# Patient Record
Sex: Female | Born: 1985 | Race: White | Hispanic: No | Marital: Single | Smoking: Former smoker
Health system: Southern US, Community
[De-identification: ages and names within clinical notes are randomized; demographics above are authoritative.]

## PROBLEM LIST (undated history)

## (undated) ENCOUNTER — Inpatient Hospital Stay: Payer: Self-pay

## (undated) DIAGNOSIS — F909 Attention-deficit hyperactivity disorder, unspecified type: Secondary | ICD-10-CM

## (undated) DIAGNOSIS — Z87442 Personal history of urinary calculi: Secondary | ICD-10-CM

## (undated) DIAGNOSIS — Z8489 Family history of other specified conditions: Secondary | ICD-10-CM

## (undated) DIAGNOSIS — K7689 Other specified diseases of liver: Secondary | ICD-10-CM

## (undated) DIAGNOSIS — R87629 Unspecified abnormal cytological findings in specimens from vagina: Secondary | ICD-10-CM

## (undated) DIAGNOSIS — K219 Gastro-esophageal reflux disease without esophagitis: Secondary | ICD-10-CM

## (undated) DIAGNOSIS — F419 Anxiety disorder, unspecified: Secondary | ICD-10-CM

## (undated) DIAGNOSIS — K760 Fatty (change of) liver, not elsewhere classified: Secondary | ICD-10-CM

## (undated) HISTORY — DX: Unspecified abnormal cytological findings in specimens from vagina: R87.629

## (undated) HISTORY — PX: CHOLECYSTECTOMY: SHX55

---
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
## 2006-12-11 ENCOUNTER — Emergency Department: Payer: Self-pay | Admitting: Emergency Medicine

## 2007-01-26 ENCOUNTER — Ambulatory Visit: Payer: Self-pay | Admitting: Unknown Physician Specialty

## 2007-02-05 ENCOUNTER — Emergency Department: Payer: Self-pay | Admitting: Emergency Medicine

## 2007-06-16 ENCOUNTER — Emergency Department: Payer: Self-pay | Admitting: Emergency Medicine

## 2007-09-23 ENCOUNTER — Emergency Department: Payer: Self-pay

## 2007-12-07 ENCOUNTER — Observation Stay: Payer: Self-pay | Admitting: Unknown Physician Specialty

## 2007-12-18 ENCOUNTER — Emergency Department: Payer: Self-pay | Admitting: Unknown Physician Specialty

## 2008-01-04 ENCOUNTER — Observation Stay: Payer: Self-pay | Admitting: Obstetrics & Gynecology

## 2008-01-05 ENCOUNTER — Observation Stay: Payer: Self-pay

## 2008-01-20 ENCOUNTER — Observation Stay: Payer: Self-pay

## 2008-01-24 ENCOUNTER — Observation Stay: Payer: Self-pay

## 2008-01-27 ENCOUNTER — Observation Stay: Payer: Self-pay | Admitting: Unknown Physician Specialty

## 2008-02-01 ENCOUNTER — Observation Stay: Payer: Self-pay

## 2008-02-07 ENCOUNTER — Inpatient Hospital Stay: Payer: Self-pay

## 2008-02-07 DIAGNOSIS — O4702 False labor before 37 completed weeks of gestation, second trimester: Secondary | ICD-10-CM

## 2008-08-13 ENCOUNTER — Encounter: Payer: Self-pay | Admitting: Family Medicine

## 2008-08-18 ENCOUNTER — Encounter: Payer: Self-pay | Admitting: Family Medicine

## 2008-09-08 ENCOUNTER — Emergency Department: Payer: Self-pay | Admitting: Emergency Medicine

## 2008-09-18 ENCOUNTER — Encounter: Payer: Self-pay | Admitting: Family Medicine

## 2008-10-18 ENCOUNTER — Encounter: Payer: Self-pay | Admitting: Family Medicine

## 2009-09-03 ENCOUNTER — Emergency Department: Payer: Self-pay | Admitting: Emergency Medicine

## 2009-11-09 ENCOUNTER — Emergency Department: Payer: Self-pay | Admitting: Emergency Medicine

## 2009-11-29 ENCOUNTER — Emergency Department: Payer: Self-pay | Admitting: Emergency Medicine

## 2010-01-15 ENCOUNTER — Emergency Department: Payer: Self-pay | Admitting: Emergency Medicine

## 2010-01-18 DIAGNOSIS — R87629 Unspecified abnormal cytological findings in specimens from vagina: Secondary | ICD-10-CM

## 2010-01-18 HISTORY — DX: Unspecified abnormal cytological findings in specimens from vagina: R87.629

## 2010-05-25 ENCOUNTER — Emergency Department: Payer: Self-pay | Admitting: Emergency Medicine

## 2010-07-27 ENCOUNTER — Emergency Department: Payer: Self-pay | Admitting: Emergency Medicine

## 2010-10-13 ENCOUNTER — Emergency Department: Payer: Self-pay | Admitting: Emergency Medicine

## 2011-07-09 ENCOUNTER — Emergency Department: Payer: Self-pay | Admitting: Emergency Medicine

## 2011-07-09 LAB — URINALYSIS, COMPLETE
Bacteria: NONE SEEN
Bilirubin,UR: NEGATIVE
Glucose,UR: 50 mg/dL (ref 0–75)
Ketone: NEGATIVE
Nitrite: NEGATIVE
RBC,UR: 1 /HPF (ref 0–5)
Specific Gravity: 1.019 (ref 1.003–1.030)

## 2011-07-09 LAB — CBC
HCT: 40.6 % (ref 35.0–47.0)
HGB: 13.8 g/dL (ref 12.0–16.0)
MCH: 30.3 pg (ref 26.0–34.0)
MCHC: 34 g/dL (ref 32.0–36.0)
MCV: 89 fL (ref 80–100)
Platelet: 184 10*3/uL (ref 150–440)
RBC: 4.56 10*6/uL (ref 3.80–5.20)
RDW: 13.2 % (ref 11.5–14.5)
WBC: 10.8 10*3/uL (ref 3.6–11.0)

## 2011-07-09 LAB — HCG, QUANTITATIVE, PREGNANCY: Beta Hcg, Quant.: 2501 m[IU]/mL — ABNORMAL HIGH

## 2011-07-10 ENCOUNTER — Emergency Department: Payer: Self-pay | Admitting: *Deleted

## 2011-10-16 ENCOUNTER — Emergency Department: Payer: Self-pay | Admitting: *Deleted

## 2011-10-16 LAB — URINALYSIS, COMPLETE
Bilirubin,UR: NEGATIVE
Glucose,UR: NEGATIVE mg/dL (ref 0–75)
Ketone: NEGATIVE
Leukocyte Esterase: NEGATIVE
Nitrite: NEGATIVE
Ph: 6 (ref 4.5–8.0)
Protein: 30
RBC,UR: 172 /HPF (ref 0–5)
Specific Gravity: 1.025 (ref 1.003–1.030)
Squamous Epithelial: 17
WBC UR: 9 /HPF (ref 0–5)

## 2011-10-16 LAB — CBC
HCT: 41.4 % (ref 35.0–47.0)
HGB: 14.3 g/dL (ref 12.0–16.0)
MCH: 30.7 pg (ref 26.0–34.0)
MCHC: 34.5 g/dL (ref 32.0–36.0)
MCV: 89 fL (ref 80–100)
Platelet: 193 10*3/uL (ref 150–440)
RBC: 4.65 10*6/uL (ref 3.80–5.20)
RDW: 12.9 % (ref 11.5–14.5)
WBC: 9.3 10*3/uL (ref 3.6–11.0)

## 2011-10-16 LAB — COMPREHENSIVE METABOLIC PANEL
Albumin: 4 g/dL (ref 3.4–5.0)
Alkaline Phosphatase: 67 U/L (ref 50–136)
Anion Gap: 9 (ref 7–16)
BUN: 13 mg/dL (ref 7–18)
Bilirubin,Total: 0.2 mg/dL (ref 0.2–1.0)
Calcium, Total: 8.7 mg/dL (ref 8.5–10.1)
Chloride: 107 mmol/L (ref 98–107)
Co2: 25 mmol/L (ref 21–32)
Creatinine: 0.69 mg/dL (ref 0.60–1.30)
EGFR (African American): >60
EGFR (Non-African Amer.): >60
Glucose: 76 mg/dL (ref 65–99)
Osmolality: 280 (ref 275–301)
Potassium: 3.6 mmol/L (ref 3.5–5.1)
SGOT(AST): 21 U/L (ref 15–37)
SGPT (ALT): 22 U/L (ref 12–78)
Sodium: 141 mmol/L (ref 136–145)
Total Protein: 7.6 g/dL (ref 6.4–8.2)

## 2011-11-15 ENCOUNTER — Emergency Department: Payer: Self-pay | Admitting: Emergency Medicine

## 2011-11-15 LAB — URINALYSIS, COMPLETE
Bacteria: NONE SEEN
Glucose,UR: NEGATIVE mg/dL (ref 0–75)
Ketone: NEGATIVE
Nitrite: NEGATIVE
Protein: NEGATIVE
RBC,UR: 2 /HPF (ref 0–5)
Specific Gravity: 1.021 (ref 1.003–1.030)
Squamous Epithelial: 1
WBC UR: 2 /HPF (ref 0–5)

## 2011-11-15 LAB — CBC
HGB: 14.4 g/dL (ref 12.0–16.0)
MCH: 31.4 pg (ref 26.0–34.0)
MCHC: 35.3 g/dL (ref 32.0–36.0)
MCV: 89 fL (ref 80–100)
Platelet: 201 10*3/uL (ref 150–440)
RBC: 4.57 10*6/uL (ref 3.80–5.20)
RDW: 13 % (ref 11.5–14.5)

## 2011-11-15 LAB — COMPREHENSIVE METABOLIC PANEL
Albumin: 3.9 g/dL (ref 3.4–5.0)
Alkaline Phosphatase: 75 U/L (ref 50–136)
Anion Gap: 8 (ref 7–16)
BUN: 15 mg/dL (ref 7–18)
Bilirubin,Total: 0.2 mg/dL (ref 0.2–1.0)
Calcium, Total: 8.9 mg/dL (ref 8.5–10.1)
Chloride: 105 mmol/L (ref 98–107)
Co2: 27 mmol/L (ref 21–32)
Creatinine: 0.88 mg/dL (ref 0.60–1.30)
EGFR (African American): >60
EGFR (Non-African Amer.): >60
Osmolality: 280 (ref 275–301)
Potassium: 4.2 mmol/L (ref 3.5–5.1)
SGOT(AST): 14 U/L — ABNORMAL LOW (ref 15–37)
SGPT (ALT): 23 U/L (ref 12–78)
Sodium: 140 mmol/L (ref 136–145)
Total Protein: 7.4 g/dL (ref 6.4–8.2)

## 2012-01-19 HISTORY — PX: DILATION AND CURETTAGE OF UTERUS: SHX78

## 2012-02-04 ENCOUNTER — Emergency Department: Payer: Self-pay | Admitting: Emergency Medicine

## 2012-02-04 LAB — WET PREP, GENITAL

## 2012-02-04 LAB — CBC
HCT: 39.8 % (ref 35.0–47.0)
HGB: 13.8 g/dL (ref 12.0–16.0)
MCH: 30.2 pg (ref 26.0–34.0)
Platelet: 180 10*3/uL (ref 150–440)
RBC: 4.56 10*6/uL (ref 3.80–5.20)
RDW: 13 % (ref 11.5–14.5)
WBC: 9 10*3/uL (ref 3.6–11.0)

## 2012-02-04 LAB — BASIC METABOLIC PANEL
Anion Gap: 9 (ref 7–16)
BUN: 15 mg/dL (ref 7–18)
Co2: 24 mmol/L (ref 21–32)
Creatinine: 0.68 mg/dL (ref 0.60–1.30)
EGFR (Non-African Amer.): >60
Osmolality: 277 (ref 275–301)
Potassium: 3.9 mmol/L (ref 3.5–5.1)

## 2012-02-04 LAB — URINALYSIS, COMPLETE
Blood: NEGATIVE
Ketone: NEGATIVE
Ph: 6 (ref 4.5–8.0)
Protein: NEGATIVE
RBC,UR: 1 /HPF (ref 0–5)
Squamous Epithelial: 13
WBC UR: 3 /HPF (ref 0–5)

## 2012-06-05 ENCOUNTER — Emergency Department: Payer: Self-pay | Admitting: Emergency Medicine

## 2012-06-05 LAB — COMPREHENSIVE METABOLIC PANEL
Albumin: 4.1 g/dL (ref 3.4–5.0)
Anion Gap: 8 (ref 7–16)
Bilirubin,Total: 0.3 mg/dL (ref 0.2–1.0)
Creatinine: 0.64 mg/dL (ref 0.60–1.30)
EGFR (African American): >60
Osmolality: 270 (ref 275–301)
SGOT(AST): 24 U/L (ref 15–37)
SGPT (ALT): 23 U/L (ref 12–78)
Sodium: 135 mmol/L — ABNORMAL LOW (ref 136–145)
Total Protein: 7.7 g/dL (ref 6.4–8.2)

## 2012-06-05 LAB — URINALYSIS, COMPLETE
Bilirubin,UR: NEGATIVE
Blood: NEGATIVE
Glucose,UR: NEGATIVE mg/dL (ref 0–75)
Leukocyte Esterase: NEGATIVE
RBC,UR: 1 /HPF (ref 0–5)
Squamous Epithelial: 6
WBC UR: 1 /HPF (ref 0–5)

## 2012-06-05 LAB — CBC
HCT: 40.5 % (ref 35.0–47.0)
MCH: 30.1 pg (ref 26.0–34.0)
MCHC: 34.3 g/dL (ref 32.0–36.0)
RBC: 4.61 10*6/uL (ref 3.80–5.20)
RDW: 12.6 % (ref 11.5–14.5)
WBC: 12.5 10*3/uL — ABNORMAL HIGH (ref 3.6–11.0)

## 2012-06-07 ENCOUNTER — Emergency Department: Payer: Self-pay

## 2012-06-07 LAB — CBC WITH DIFFERENTIAL/PLATELET
Basophil #: 0.1 10*3/uL (ref 0.0–0.1)
Eosinophil #: 0.4 10*3/uL (ref 0.0–0.7)
HCT: 40.8 % (ref 35.0–47.0)
HGB: 14 g/dL (ref 12.0–16.0)
Lymphocyte #: 2.6 10*3/uL (ref 1.0–3.6)
MCH: 30.2 pg (ref 26.0–34.0)
MCHC: 34.4 g/dL (ref 32.0–36.0)
MCV: 88 fL (ref 80–100)
Monocyte %: 5.8 %
Neutrophil #: 8.3 10*3/uL — ABNORMAL HIGH (ref 1.4–6.5)
Platelet: 214 10*3/uL (ref 150–440)
RDW: 13 % (ref 11.5–14.5)
WBC: 12.1 10*3/uL — ABNORMAL HIGH (ref 3.6–11.0)

## 2012-06-07 LAB — HCG, QUANTITATIVE, PREGNANCY: Beta Hcg, Quant.: 138 m[IU]/mL — ABNORMAL HIGH

## 2012-06-21 ENCOUNTER — Emergency Department: Payer: Self-pay | Admitting: Emergency Medicine

## 2012-06-21 LAB — HCG, QUANTITATIVE, PREGNANCY: Beta Hcg, Quant.: 15585 m[IU]/mL — ABNORMAL HIGH

## 2012-06-21 LAB — BASIC METABOLIC PANEL
Anion Gap: 8 (ref 7–16)
Calcium, Total: 9 mg/dL (ref 8.5–10.1)
Chloride: 105 mmol/L (ref 98–107)
Co2: 23 mmol/L (ref 21–32)
EGFR (African American): >60
EGFR (Non-African Amer.): >60
Osmolality: 270 (ref 275–301)
Potassium: 3.9 mmol/L (ref 3.5–5.1)

## 2012-06-21 LAB — URINALYSIS, COMPLETE
Bilirubin,UR: NEGATIVE
Glucose,UR: NEGATIVE mg/dL (ref 0–75)
Nitrite: NEGATIVE
Protein: NEGATIVE
RBC,UR: 1120 /HPF (ref 0–5)
Specific Gravity: 1.023 (ref 1.003–1.030)
Squamous Epithelial: 6

## 2012-06-21 LAB — CBC
HGB: 13.7 g/dL (ref 12.0–16.0)
MCH: 30.2 pg (ref 26.0–34.0)
MCHC: 35 g/dL (ref 32.0–36.0)
MCV: 86 fL (ref 80–100)
Platelet: 220 10*3/uL (ref 150–440)
RDW: 12.6 % (ref 11.5–14.5)
WBC: 10.4 10*3/uL (ref 3.6–11.0)

## 2012-08-18 ENCOUNTER — Emergency Department: Payer: Self-pay | Admitting: Emergency Medicine

## 2012-09-23 ENCOUNTER — Emergency Department: Payer: Self-pay | Admitting: Emergency Medicine

## 2012-11-19 ENCOUNTER — Emergency Department: Payer: Self-pay | Admitting: Emergency Medicine

## 2012-11-19 LAB — COMPREHENSIVE METABOLIC PANEL
Albumin: 3.9 g/dL (ref 3.4–5.0)
Alkaline Phosphatase: 82 U/L (ref 50–136)
Anion Gap: 6 — ABNORMAL LOW (ref 7–16)
Chloride: 106 mmol/L (ref 98–107)
Co2: 26 mmol/L (ref 21–32)
Creatinine: 0.79 mg/dL (ref 0.60–1.30)
EGFR (African American): >60
EGFR (Non-African Amer.): >60
Osmolality: 275 (ref 275–301)
SGOT(AST): 13 U/L — ABNORMAL LOW (ref 15–37)
SGPT (ALT): 18 U/L (ref 12–78)
Sodium: 138 mmol/L (ref 136–145)

## 2012-11-19 LAB — URINALYSIS, COMPLETE
Bacteria: NONE SEEN
Bilirubin,UR: NEGATIVE
Ketone: NEGATIVE
Leukocyte Esterase: NEGATIVE
Nitrite: NEGATIVE
RBC,UR: <1 /HPF (ref 0–5)
Specific Gravity: 1.016 (ref 1.003–1.030)
Squamous Epithelial: 1
WBC UR: 1 /HPF (ref 0–5)

## 2012-11-19 LAB — CBC
HGB: 13.8 g/dL (ref 12.0–16.0)
MCH: 30.7 pg (ref 26.0–34.0)
RBC: 4.48 10*6/uL (ref 3.80–5.20)
WBC: 7.6 10*3/uL (ref 3.6–11.0)

## 2012-12-07 ENCOUNTER — Emergency Department: Payer: Self-pay | Admitting: Emergency Medicine

## 2012-12-07 LAB — URINALYSIS, COMPLETE
Nitrite: NEGATIVE
Protein: 100
RBC,UR: 415 /HPF (ref 0–5)
Specific Gravity: 1.027 (ref 1.003–1.030)
Squamous Epithelial: 76
WBC UR: 220 /HPF (ref 0–5)

## 2012-12-07 LAB — CBC WITH DIFFERENTIAL/PLATELET
Basophil %: 0.6 %
Eosinophil #: 0.4 10*3/uL (ref 0.0–0.7)
Eosinophil %: 3.4 %
HCT: 39.4 % (ref 35.0–47.0)
HGB: 13.6 g/dL (ref 12.0–16.0)
Lymphocyte #: 2.2 10*3/uL (ref 1.0–3.6)
Lymphocyte %: 17.4 %
MCHC: 34.6 g/dL (ref 32.0–36.0)
MCV: 87 fL (ref 80–100)
Monocyte #: 0.8 x10 3/mm (ref 0.2–0.9)
Monocyte %: 6.2 %
Neutrophil #: 9.1 10*3/uL — ABNORMAL HIGH (ref 1.4–6.5)
RBC: 4.53 10*6/uL (ref 3.80–5.20)
RDW: 13.3 % (ref 11.5–14.5)
WBC: 12.6 10*3/uL — ABNORMAL HIGH (ref 3.6–11.0)

## 2012-12-07 LAB — COMPREHENSIVE METABOLIC PANEL
Albumin: 4.1 g/dL (ref 3.4–5.0)
Alkaline Phosphatase: 92 U/L (ref 50–136)
Anion Gap: 7 (ref 7–16)
BUN: 11 mg/dL (ref 7–18)
Bilirubin,Total: 0.3 mg/dL (ref 0.2–1.0)
Calcium, Total: 8.8 mg/dL (ref 8.5–10.1)
Chloride: 105 mmol/L (ref 98–107)
Co2: 27 mmol/L (ref 21–32)
Creatinine: 0.74 mg/dL (ref 0.60–1.30)
EGFR (African American): >60
Glucose: 88 mg/dL (ref 65–99)
Potassium: 3.6 mmol/L (ref 3.5–5.1)
SGOT(AST): 15 U/L (ref 15–37)
SGPT (ALT): 20 U/L (ref 12–78)
Total Protein: 7.3 g/dL (ref 6.4–8.2)

## 2012-12-22 ENCOUNTER — Emergency Department: Payer: Self-pay | Admitting: Emergency Medicine

## 2012-12-22 LAB — COMPREHENSIVE METABOLIC PANEL
Albumin: 3.9 g/dL (ref 3.4–5.0)
Alkaline Phosphatase: 61 U/L
Bilirubin,Total: 0.3 mg/dL (ref 0.2–1.0)
Calcium, Total: 8.9 mg/dL (ref 8.5–10.1)
Chloride: 106 mmol/L (ref 98–107)
Co2: 24 mmol/L (ref 21–32)
Creatinine: 0.57 mg/dL — ABNORMAL LOW (ref 0.60–1.30)
EGFR (Non-African Amer.): >60
Glucose: 86 mg/dL (ref 65–99)
Osmolality: 275 (ref 275–301)
Potassium: 4 mmol/L (ref 3.5–5.1)
SGOT(AST): 12 U/L — ABNORMAL LOW (ref 15–37)
SGPT (ALT): 18 U/L (ref 12–78)
Sodium: 138 mmol/L (ref 136–145)
Total Protein: 7.1 g/dL (ref 6.4–8.2)

## 2012-12-22 LAB — URINALYSIS, COMPLETE
Bacteria: NONE SEEN
Bilirubin,UR: NEGATIVE
Glucose,UR: NEGATIVE mg/dL (ref 0–75)
Ketone: NEGATIVE
Ph: 6 (ref 4.5–8.0)
Protein: NEGATIVE
RBC,UR: 1 /HPF (ref 0–5)
Specific Gravity: 1.025 (ref 1.003–1.030)
Squamous Epithelial: 3
WBC UR: 2 /HPF (ref 0–5)

## 2012-12-22 LAB — CBC WITH DIFFERENTIAL/PLATELET
Basophil #: 0.1 10*3/uL (ref 0.0–0.1)
Eosinophil #: 0.5 10*3/uL (ref 0.0–0.7)
Eosinophil %: 6.5 %
Lymphocyte %: 35.8 %
MCH: 30.1 pg (ref 26.0–34.0)
MCHC: 34.6 g/dL (ref 32.0–36.0)
MCV: 87 fL (ref 80–100)
Monocyte #: 0.5 x10 3/mm (ref 0.2–0.9)
Neutrophil %: 49.5 %
Platelet: 197 10*3/uL (ref 150–440)
RBC: 4.41 10*6/uL (ref 3.80–5.20)
WBC: 7.3 10*3/uL (ref 3.6–11.0)

## 2012-12-22 LAB — LIPASE, BLOOD: Lipase: 165 U/L (ref 73–393)

## 2013-01-23 ENCOUNTER — Emergency Department: Payer: Self-pay | Admitting: Emergency Medicine

## 2013-01-23 LAB — COMPREHENSIVE METABOLIC PANEL
ANION GAP: 5 — AB (ref 7–16)
Albumin: 4 g/dL (ref 3.4–5.0)
Alkaline Phosphatase: 68 U/L
BILIRUBIN TOTAL: 0.8 mg/dL (ref 0.2–1.0)
BUN: 13 mg/dL (ref 7–18)
CALCIUM: 9 mg/dL (ref 8.5–10.1)
CHLORIDE: 106 mmol/L (ref 98–107)
Co2: 28 mmol/L (ref 21–32)
Creatinine: 0.74 mg/dL (ref 0.60–1.30)
Glucose: 91 mg/dL (ref 65–99)
OSMOLALITY: 277 (ref 275–301)
Potassium: 3.7 mmol/L (ref 3.5–5.1)
SGOT(AST): 10 U/L — ABNORMAL LOW (ref 15–37)
SGPT (ALT): 17 U/L (ref 12–78)
SODIUM: 139 mmol/L (ref 136–145)
Total Protein: 7.3 g/dL (ref 6.4–8.2)

## 2013-01-23 LAB — CBC WITH DIFFERENTIAL/PLATELET
BASOS ABS: 0 10*3/uL (ref 0.0–0.1)
Basophil %: 0.4 %
Eosinophil #: 0.2 10*3/uL (ref 0.0–0.7)
Eosinophil %: 2.6 %
HCT: 40 % (ref 35.0–47.0)
HGB: 14.1 g/dL (ref 12.0–16.0)
Lymphocyte #: 1.7 10*3/uL (ref 1.0–3.6)
Lymphocyte %: 20.3 %
MCH: 30.6 pg (ref 26.0–34.0)
MCHC: 35.4 g/dL (ref 32.0–36.0)
MCV: 86 fL (ref 80–100)
Monocyte #: 0.5 x10 3/mm (ref 0.2–0.9)
Monocyte %: 5.8 %
NEUTROS ABS: 6 10*3/uL (ref 1.4–6.5)
NEUTROS PCT: 70.9 %
Platelet: 199 10*3/uL (ref 150–440)
RBC: 4.63 10*6/uL (ref 3.80–5.20)
RDW: 12.9 % (ref 11.5–14.5)
WBC: 8.5 10*3/uL (ref 3.6–11.0)

## 2013-01-23 LAB — URINALYSIS, COMPLETE
Bacteria: NONE SEEN
Bilirubin,UR: NEGATIVE
Blood: NEGATIVE
GLUCOSE, UR: NEGATIVE mg/dL (ref 0–75)
Ketone: NEGATIVE
Leukocyte Esterase: NEGATIVE
Nitrite: NEGATIVE
PH: 5 (ref 4.5–8.0)
SPECIFIC GRAVITY: 1.029 (ref 1.003–1.030)
Squamous Epithelial: 19
WBC UR: 5 /HPF (ref 0–5)

## 2013-01-23 LAB — LIPASE, BLOOD: LIPASE: 124 U/L (ref 73–393)

## 2013-03-12 ENCOUNTER — Emergency Department: Payer: Self-pay | Admitting: Emergency Medicine

## 2013-03-12 LAB — URINALYSIS, COMPLETE
BACTERIA: NONE SEEN
BILIRUBIN, UR: NEGATIVE
Blood: NEGATIVE
Glucose,UR: NEGATIVE mg/dL (ref 0–75)
KETONE: NEGATIVE
Nitrite: NEGATIVE
PH: 6 (ref 4.5–8.0)
Protein: NEGATIVE
RBC,UR: 6 /HPF (ref 0–5)
Specific Gravity: 1.028 (ref 1.003–1.030)
Squamous Epithelial: 11

## 2013-04-10 ENCOUNTER — Emergency Department: Payer: Self-pay | Admitting: Emergency Medicine

## 2013-04-14 ENCOUNTER — Emergency Department: Payer: Self-pay | Admitting: Emergency Medicine

## 2013-05-12 ENCOUNTER — Emergency Department: Payer: Self-pay | Admitting: Emergency Medicine

## 2013-05-12 LAB — CBC
HCT: 37.8 % (ref 35.0–47.0)
HGB: 12.8 g/dL (ref 12.0–16.0)
MCH: 30.7 pg (ref 26.0–34.0)
MCHC: 33.9 g/dL (ref 32.0–36.0)
MCV: 91 fL (ref 80–100)
Platelet: 165 10*3/uL (ref 150–440)
RBC: 4.16 10*6/uL (ref 3.80–5.20)
RDW: 13.1 % (ref 11.5–14.5)
WBC: 11.5 10*3/uL — ABNORMAL HIGH (ref 3.6–11.0)

## 2013-05-12 LAB — URINALYSIS, COMPLETE
BACTERIA: NONE SEEN
BILIRUBIN, UR: NEGATIVE
GLUCOSE, UR: NEGATIVE mg/dL (ref 0–75)
Ketone: NEGATIVE
Leukocyte Esterase: NEGATIVE
NITRITE: NEGATIVE
PROTEIN: NEGATIVE
Ph: 5 (ref 4.5–8.0)
SPECIFIC GRAVITY: 1.026 (ref 1.003–1.030)
WBC UR: 3 /HPF (ref 0–5)

## 2013-05-12 LAB — GC/CHLAMYDIA PROBE AMP

## 2013-05-12 LAB — HCG, QUANTITATIVE, PREGNANCY: BETA HCG, QUANT.: 20378 m[IU]/mL — AB

## 2013-05-12 LAB — WET PREP, GENITAL

## 2013-06-22 ENCOUNTER — Emergency Department: Payer: Self-pay | Admitting: Emergency Medicine

## 2013-06-22 LAB — URINALYSIS, COMPLETE
Bilirubin,UR: NEGATIVE
Glucose,UR: NEGATIVE mg/dL (ref 0–75)
KETONE: NEGATIVE
Leukocyte Esterase: NEGATIVE
NITRITE: NEGATIVE
PH: 5 (ref 4.5–8.0)
Protein: NEGATIVE
RBC,UR: 1 /HPF (ref 0–5)
Specific Gravity: 1.029 (ref 1.003–1.030)
WBC UR: 1 /HPF (ref 0–5)

## 2013-06-22 LAB — COMPREHENSIVE METABOLIC PANEL
ALBUMIN: 4.2 g/dL (ref 3.4–5.0)
Alkaline Phosphatase: 69 U/L
Anion Gap: 5 — ABNORMAL LOW (ref 7–16)
BILIRUBIN TOTAL: 0.3 mg/dL (ref 0.2–1.0)
BUN: 13 mg/dL (ref 7–18)
CREATININE: 0.71 mg/dL (ref 0.60–1.30)
Calcium, Total: 9 mg/dL (ref 8.5–10.1)
Chloride: 109 mmol/L — ABNORMAL HIGH (ref 98–107)
Co2: 26 mmol/L (ref 21–32)
EGFR (Non-African Amer.): >60
GLUCOSE: 79 mg/dL (ref 65–99)
OSMOLALITY: 278 (ref 275–301)
POTASSIUM: 3.6 mmol/L (ref 3.5–5.1)
SGOT(AST): 29 U/L (ref 15–37)
SGPT (ALT): 22 U/L (ref 12–78)
Sodium: 140 mmol/L (ref 136–145)
Total Protein: 7.7 g/dL (ref 6.4–8.2)

## 2013-06-22 LAB — CBC
HCT: 39.8 % (ref 35.0–47.0)
HGB: 13.5 g/dL (ref 12.0–16.0)
MCH: 30.3 pg (ref 26.0–34.0)
MCHC: 33.9 g/dL (ref 32.0–36.0)
MCV: 90 fL (ref 80–100)
Platelet: 189 10*3/uL (ref 150–440)
RBC: 4.44 10*6/uL (ref 3.80–5.20)
RDW: 12.9 % (ref 11.5–14.5)
WBC: 7.8 10*3/uL (ref 3.6–11.0)

## 2013-06-24 LAB — URINE CULTURE

## 2013-08-20 ENCOUNTER — Emergency Department: Payer: Self-pay | Admitting: Emergency Medicine

## 2013-08-20 LAB — URINALYSIS, COMPLETE
BILIRUBIN, UR: NEGATIVE
Bacteria: NONE SEEN
Glucose,UR: NEGATIVE mg/dL (ref 0–75)
KETONE: NEGATIVE
Nitrite: NEGATIVE
PH: 5 (ref 4.5–8.0)
Protein: 30
RBC,UR: 1112 /HPF (ref 0–5)
SPECIFIC GRAVITY: 1.028 (ref 1.003–1.030)

## 2013-08-20 LAB — CBC
HCT: 37.7 % (ref 35.0–47.0)
HGB: 12.6 g/dL (ref 12.0–16.0)
MCH: 30.6 pg (ref 26.0–34.0)
MCHC: 33.5 g/dL (ref 32.0–36.0)
MCV: 91 fL (ref 80–100)
Platelet: 174 10*3/uL (ref 150–440)
RBC: 4.13 10*6/uL (ref 3.80–5.20)
RDW: 12.7 % (ref 11.5–14.5)
WBC: 10.3 10*3/uL (ref 3.6–11.0)

## 2013-08-20 LAB — HCG, QUANTITATIVE, PREGNANCY: BETA HCG, QUANT.: 285 m[IU]/mL — AB

## 2013-09-13 ENCOUNTER — Emergency Department: Payer: Self-pay | Admitting: Emergency Medicine

## 2013-09-13 LAB — CBC WITH DIFFERENTIAL/PLATELET
BASOS ABS: 0.1 10*3/uL (ref 0.0–0.1)
Basophil %: 0.9 %
Eosinophil #: 0.5 10*3/uL (ref 0.0–0.7)
Eosinophil %: 4.6 %
HCT: 40 % (ref 35.0–47.0)
HGB: 13.4 g/dL (ref 12.0–16.0)
LYMPHS PCT: 20.8 %
Lymphocyte #: 2.1 10*3/uL (ref 1.0–3.6)
MCH: 30.2 pg (ref 26.0–34.0)
MCHC: 33.5 g/dL (ref 32.0–36.0)
MCV: 90 fL (ref 80–100)
Monocyte #: 0.7 x10 3/mm (ref 0.2–0.9)
Monocyte %: 6.9 %
Neutrophil #: 6.7 10*3/uL — ABNORMAL HIGH (ref 1.4–6.5)
Neutrophil %: 66.8 %
Platelet: 207 10*3/uL (ref 150–440)
RBC: 4.44 10*6/uL (ref 3.80–5.20)
RDW: 12.5 % (ref 11.5–14.5)
WBC: 10 10*3/uL (ref 3.6–11.0)

## 2013-09-13 LAB — COMPREHENSIVE METABOLIC PANEL
ALK PHOS: 67 U/L
Albumin: 3.6 g/dL (ref 3.4–5.0)
Anion Gap: 9 (ref 7–16)
BUN: 9 mg/dL (ref 7–18)
Bilirubin,Total: 0.3 mg/dL (ref 0.2–1.0)
Calcium, Total: 8.7 mg/dL (ref 8.5–10.1)
Chloride: 107 mmol/L (ref 98–107)
Co2: 22 mmol/L (ref 21–32)
Creatinine: 0.7 mg/dL (ref 0.60–1.30)
EGFR (African American): >60
EGFR (Non-African Amer.): >60
Glucose: 88 mg/dL (ref 65–99)
Osmolality: 274 (ref 275–301)
POTASSIUM: 4.1 mmol/L (ref 3.5–5.1)
SGOT(AST): 13 U/L — ABNORMAL LOW (ref 15–37)
SGPT (ALT): 16 U/L
Sodium: 138 mmol/L (ref 136–145)
TOTAL PROTEIN: 7.2 g/dL (ref 6.4–8.2)

## 2013-09-13 LAB — URINALYSIS, COMPLETE
BACTERIA: NONE SEEN
BLOOD: NEGATIVE
Bilirubin,UR: NEGATIVE
Glucose,UR: NEGATIVE mg/dL (ref 0–75)
Ketone: NEGATIVE
LEUKOCYTE ESTERASE: NEGATIVE
Nitrite: NEGATIVE
PROTEIN: NEGATIVE
Ph: 7 (ref 4.5–8.0)
Specific Gravity: 1.02 (ref 1.003–1.030)
Squamous Epithelial: 2
WBC UR: 3 /HPF (ref 0–5)

## 2013-09-13 LAB — HCG, QUANTITATIVE, PREGNANCY: BETA HCG, QUANT.: 12271 m[IU]/mL — AB

## 2013-09-17 ENCOUNTER — Emergency Department: Payer: Self-pay | Admitting: Emergency Medicine

## 2013-09-17 LAB — CBC WITH DIFFERENTIAL/PLATELET
BASOS ABS: 0.1 10*3/uL (ref 0.0–0.1)
Basophil %: 0.5 %
Eosinophil #: 0.5 10*3/uL (ref 0.0–0.7)
Eosinophil %: 3.7 %
HCT: 38 % (ref 35.0–47.0)
HGB: 12.9 g/dL (ref 12.0–16.0)
LYMPHS PCT: 16.6 %
Lymphocyte #: 2 10*3/uL (ref 1.0–3.6)
MCH: 30.6 pg (ref 26.0–34.0)
MCHC: 34 g/dL (ref 32.0–36.0)
MCV: 90 fL (ref 80–100)
MONOS PCT: 5.7 %
Monocyte #: 0.7 x10 3/mm (ref 0.2–0.9)
NEUTROS ABS: 9 10*3/uL — AB (ref 1.4–6.5)
Neutrophil %: 73.5 %
Platelet: 196 10*3/uL (ref 150–440)
RBC: 4.23 10*6/uL (ref 3.80–5.20)
RDW: 12.7 % (ref 11.5–14.5)
WBC: 12.3 10*3/uL — ABNORMAL HIGH (ref 3.6–11.0)

## 2013-09-17 LAB — COMPREHENSIVE METABOLIC PANEL
ANION GAP: 8 (ref 7–16)
Albumin: 3.5 g/dL (ref 3.4–5.0)
Alkaline Phosphatase: 61 U/L
BUN: 8 mg/dL (ref 7–18)
Bilirubin,Total: 0.3 mg/dL (ref 0.2–1.0)
CALCIUM: 8.7 mg/dL (ref 8.5–10.1)
Chloride: 106 mmol/L (ref 98–107)
Co2: 24 mmol/L (ref 21–32)
Creatinine: 0.76 mg/dL (ref 0.60–1.30)
EGFR (African American): >60
Glucose: 83 mg/dL (ref 65–99)
OSMOLALITY: 273 (ref 275–301)
POTASSIUM: 3.8 mmol/L (ref 3.5–5.1)
SGOT(AST): 12 U/L — ABNORMAL LOW (ref 15–37)
SGPT (ALT): 16 U/L
Sodium: 138 mmol/L (ref 136–145)
TOTAL PROTEIN: 6.9 g/dL (ref 6.4–8.2)

## 2013-09-17 LAB — HCG, QUANTITATIVE, PREGNANCY: Beta Hcg, Quant.: 12252 m[IU]/mL — ABNORMAL HIGH

## 2013-09-17 LAB — URINALYSIS, COMPLETE
Bacteria: NONE SEEN
Bilirubin,UR: NEGATIVE
Blood: NEGATIVE
Glucose,UR: NEGATIVE mg/dL (ref 0–75)
KETONE: NEGATIVE
Nitrite: NEGATIVE
PH: 5 (ref 4.5–8.0)
PROTEIN: NEGATIVE
Specific Gravity: 1.026 (ref 1.003–1.030)
Squamous Epithelial: 6

## 2013-09-17 LAB — WET PREP, GENITAL

## 2013-09-24 ENCOUNTER — Emergency Department: Payer: Self-pay | Admitting: Emergency Medicine

## 2013-10-29 ENCOUNTER — Emergency Department: Payer: Self-pay | Admitting: Emergency Medicine

## 2013-10-29 LAB — URINALYSIS, COMPLETE
BACTERIA: NONE SEEN
BLOOD: NEGATIVE
Bilirubin,UR: NEGATIVE
Glucose,UR: NEGATIVE mg/dL (ref 0–75)
Ketone: NEGATIVE
Nitrite: NEGATIVE
PH: 5 (ref 4.5–8.0)
PROTEIN: NEGATIVE
RBC,UR: 10 /HPF (ref 0–5)
SPECIFIC GRAVITY: 1.02 (ref 1.003–1.030)
Squamous Epithelial: 30
Transitional Epi: 2
WBC UR: 33 /HPF (ref 0–5)

## 2013-10-29 LAB — WET PREP, GENITAL

## 2013-10-29 LAB — GC/CHLAMYDIA PROBE AMP

## 2013-10-31 LAB — URINE CULTURE

## 2013-12-18 ENCOUNTER — Emergency Department: Payer: Self-pay | Admitting: Emergency Medicine

## 2013-12-18 LAB — CBC WITH DIFFERENTIAL/PLATELET
Basophil #: 0.1 10*3/uL (ref 0.0–0.1)
Basophil %: 0.6 %
EOS PCT: 4.9 %
Eosinophil #: 0.6 10*3/uL (ref 0.0–0.7)
HCT: 38.9 % (ref 35.0–47.0)
HGB: 12.9 g/dL (ref 12.0–16.0)
Lymphocyte #: 2.5 10*3/uL (ref 1.0–3.6)
Lymphocyte %: 19.5 %
MCH: 30.3 pg (ref 26.0–34.0)
MCHC: 33.2 g/dL (ref 32.0–36.0)
MCV: 91 fL (ref 80–100)
MONO ABS: 0.8 x10 3/mm (ref 0.2–0.9)
Monocyte %: 6.4 %
NEUTROS PCT: 68.6 %
Neutrophil #: 8.7 10*3/uL — ABNORMAL HIGH (ref 1.4–6.5)
PLATELETS: 200 10*3/uL (ref 150–440)
RBC: 4.27 10*6/uL (ref 3.80–5.20)
RDW: 13.3 % (ref 11.5–14.5)
WBC: 12.6 10*3/uL — ABNORMAL HIGH (ref 3.6–11.0)

## 2013-12-18 LAB — COMPREHENSIVE METABOLIC PANEL
ALK PHOS: 60 U/L
Albumin: 3.4 g/dL (ref 3.4–5.0)
Anion Gap: 7 (ref 7–16)
BUN: 12 mg/dL (ref 7–18)
Bilirubin,Total: 0.2 mg/dL (ref 0.2–1.0)
CALCIUM: 8.3 mg/dL — AB (ref 8.5–10.1)
CHLORIDE: 107 mmol/L (ref 98–107)
CO2: 25 mmol/L (ref 21–32)
Creatinine: 0.68 mg/dL (ref 0.60–1.30)
EGFR (African American): >60
EGFR (Non-African Amer.): >60
Glucose: 117 mg/dL — ABNORMAL HIGH (ref 65–99)
OSMOLALITY: 278 (ref 275–301)
POTASSIUM: 3.8 mmol/L (ref 3.5–5.1)
SGOT(AST): 14 U/L — ABNORMAL LOW (ref 15–37)
SGPT (ALT): 20 U/L
Sodium: 139 mmol/L (ref 136–145)
TOTAL PROTEIN: 6.6 g/dL (ref 6.4–8.2)

## 2013-12-18 LAB — URINALYSIS, COMPLETE
BACTERIA: NONE SEEN
Bilirubin,UR: NEGATIVE
Glucose,UR: NEGATIVE mg/dL (ref 0–75)
Ketone: NEGATIVE
NITRITE: NEGATIVE
Ph: 5 (ref 4.5–8.0)
Protein: 30
SPECIFIC GRAVITY: 1.027 (ref 1.003–1.030)

## 2013-12-18 LAB — HCG, QUANTITATIVE, PREGNANCY: Beta Hcg, Quant.: 946 m[IU]/mL — ABNORMAL HIGH

## 2014-01-14 ENCOUNTER — Emergency Department: Payer: Self-pay | Admitting: Emergency Medicine

## 2014-01-19 ENCOUNTER — Emergency Department: Payer: Self-pay | Admitting: Emergency Medicine

## 2014-03-01 ENCOUNTER — Emergency Department: Payer: Self-pay | Admitting: Emergency Medicine

## 2014-03-22 ENCOUNTER — Emergency Department: Payer: Self-pay | Admitting: Student

## 2014-04-12 ENCOUNTER — Emergency Department: Payer: Self-pay | Admitting: Student

## 2014-04-12 LAB — URINALYSIS, COMPLETE
BILIRUBIN, UR: NEGATIVE
BLOOD: NEGATIVE
Bacteria: NONE SEEN
Glucose,UR: NEGATIVE mg/dL (ref 0–75)
Ketone: NEGATIVE
LEUKOCYTE ESTERASE: NEGATIVE
NITRITE: NEGATIVE
PROTEIN: NEGATIVE
Ph: 5 (ref 4.5–8.0)
Specific Gravity: 1.028 (ref 1.003–1.030)
WBC UR: 2 /HPF (ref 0–5)

## 2014-04-12 LAB — COMPREHENSIVE METABOLIC PANEL
ALBUMIN: 4.2 g/dL
ALK PHOS: 61 U/L
Anion Gap: 7 (ref 7–16)
BUN: 15 mg/dL
Bilirubin,Total: 0.4 mg/dL
CO2: 26 mmol/L
Calcium, Total: 9.3 mg/dL
Chloride: 109 mmol/L
Creatinine: 0.68 mg/dL
EGFR (African American): >60
EGFR (Non-African Amer.): >60
Glucose: 111 mg/dL — ABNORMAL HIGH
POTASSIUM: 3.6 mmol/L
SGOT(AST): 23 U/L
SGPT (ALT): 21 U/L
Sodium: 142 mmol/L
Total Protein: 6.8 g/dL

## 2014-04-12 LAB — CBC
HCT: 38.7 % (ref 35.0–47.0)
HGB: 13.2 g/dL (ref 12.0–16.0)
MCH: 30.2 pg (ref 26.0–34.0)
MCHC: 34.1 g/dL (ref 32.0–36.0)
MCV: 89 fL (ref 80–100)
Platelet: 183 10*3/uL (ref 150–440)
RBC: 4.37 10*6/uL (ref 3.80–5.20)
RDW: 12.8 % (ref 11.5–14.5)
WBC: 10.5 10*3/uL (ref 3.6–11.0)

## 2014-04-13 LAB — URINE CULTURE

## 2014-05-24 ENCOUNTER — Encounter: Payer: Self-pay | Admitting: *Deleted

## 2014-05-24 ENCOUNTER — Emergency Department
Admission: EM | Admit: 2014-05-24 | Discharge: 2014-05-24 | Disposition: A | Discharge status: Home / Self Care | Payer: Medicaid Other | Attending: Emergency Medicine | Admitting: Emergency Medicine

## 2014-05-24 ENCOUNTER — Emergency Department: Payer: Medicaid Other

## 2014-05-24 DIAGNOSIS — R102 Pelvic and perineal pain: Secondary | ICD-10-CM | POA: Diagnosis not present

## 2014-05-24 DIAGNOSIS — R1031 Right lower quadrant pain: Secondary | ICD-10-CM | POA: Diagnosis not present

## 2014-05-24 DIAGNOSIS — Z3202 Encounter for pregnancy test, result negative: Secondary | ICD-10-CM | POA: Diagnosis not present

## 2014-05-24 LAB — CBC WITH DIFFERENTIAL/PLATELET
Basophils Absolute: 0.1 10*3/uL (ref 0–0.1)
Basophils Relative: 1 %
EOS ABS: 0.5 10*3/uL (ref 0–0.7)
Eosinophils Relative: 5 %
HEMATOCRIT: 39.8 % (ref 35.0–47.0)
Hemoglobin: 13.5 g/dL (ref 12.0–16.0)
Lymphocytes Relative: 25 %
Lymphs Abs: 2.5 10*3/uL (ref 1.0–3.6)
MCH: 30.3 pg (ref 26.0–34.0)
MCHC: 33.9 g/dL (ref 32.0–36.0)
MCV: 89.5 fL (ref 80.0–100.0)
Monocytes Absolute: 0.5 10*3/uL (ref 0.2–0.9)
Monocytes Relative: 6 %
Neutro Abs: 6.2 10*3/uL (ref 1.4–6.5)
Neutrophils Relative %: 63 %
PLATELETS: 189 10*3/uL (ref 150–440)
RBC: 4.45 MIL/uL (ref 3.80–5.20)
RDW: 12.8 % (ref 11.5–14.5)
WBC: 9.8 10*3/uL (ref 3.6–11.0)

## 2014-05-24 LAB — POCT PREGNANCY, URINE: Preg Test, Ur: NEGATIVE

## 2014-05-24 LAB — URINALYSIS COMPLETE WITH MICROSCOPIC (ARMC ONLY)
BACTERIA UA: NONE SEEN
BILIRUBIN URINE: NEGATIVE
Glucose, UA: NEGATIVE mg/dL
Hgb urine dipstick: NEGATIVE
Ketones, ur: NEGATIVE mg/dL
LEUKOCYTES UA: NEGATIVE
Nitrite: NEGATIVE
Protein, ur: NEGATIVE mg/dL
Specific Gravity, Urine: 1.021 (ref 1.005–1.030)
pH: 5 (ref 5.0–8.0)

## 2014-05-24 LAB — POC URINE PREG, ED: PREG TEST UR: NEGATIVE

## 2014-05-24 NOTE — ED Notes (Signed)
Pt informed to return if any life threatening symptoms occur.  

## 2014-05-24 NOTE — ED Notes (Signed)
Patient transported to Ultrasound 

## 2014-05-24 NOTE — ED Notes (Signed)
MD at bedside. 

## 2014-05-24 NOTE — Discharge Instructions (Signed)
Your ultrasound looks normal except for a small cyst in your cervix. There are no cysts on the ovaries. Your urine looks normal with no sign of infection. Your urine pregnancy test was negative. We discussed other possibilities including the unlikely chance of appendicitis. We agreed to not get a CT scan for that today. If you her pain gets worse or your other urgent concerns please return to the emergency department. Follow-up with your doctor at [facility] family practice. He may need to see a gynecologist as well given the delay in your menstrual cycle.  Pelvic Pain Female pelvic pain can be caused by many different things and start from a variety of places. Pelvic pain refers to pain that is located in the lower half of the abdomen and between your hips. The pain may occur over a short period of time (acute) or may be reoccurring (chronic). The cause of pelvic pain may be related to disorders affecting the female reproductive organs (gynecologic), but it may also be related to the bladder, kidney stones, an intestinal complication, or muscle or skeletal problems. Getting help right away for pelvic pain is important, especially if there has been severe, sharp, or a sudden onset of unusual pain. It is also important to get help right away because some types of pelvic pain can be life threatening.  CAUSES  Below are only some of the causes of pelvic pain. The causes of pelvic pain can be in one of several categories.   Gynecologic.  Pelvic inflammatory disease.  Sexually transmitted infection.  Ovarian cyst or a twisted ovarian ligament (ovarian torsion).  Uterine lining that grows outside the uterus (endometriosis).  Fibroids, cysts, or tumors.  Ovulation.  Pregnancy.  Pregnancy that occurs outside the uterus (ectopic pregnancy).  Miscarriage.  Labor.  Abruption of the placenta or ruptured uterus.  Infection.  Uterine infection (endometritis).  Bladder  infection.  Diverticulitis.  Miscarriage related to a uterine infection (septic abortion).  Bladder.  Inflammation of the bladder (cystitis).  Kidney stone(s).  Gastrointestinal.  Constipation.  Diverticulitis.  Neurologic.  Trauma.  Feeling pelvic pain because of mental or emotional causes (psychosomatic).  Cancers of the bowel or pelvis. EVALUATION  Your caregiver will want to take a careful history of your concerns. This includes recent changes in your health, a careful gynecologic history of your periods (menses), and a sexual history. Obtaining your family history and medical history is also important. Your caregiver may suggest a pelvic exam. A pelvic exam will help identify the location and severity of the pain. It also helps in the evaluation of which organ system may be involved. In order to identify the cause of the pelvic pain and be properly treated, your caregiver may order tests. These tests may include:   A pregnancy test.  Pelvic ultrasonography.  An X-ray exam of the abdomen.  A urinalysis or evaluation of vaginal discharge.  Blood tests. HOME CARE INSTRUCTIONS   Only take over-the-counter or prescription medicines for pain, discomfort, or fever as directed by your caregiver.   Rest as directed by your caregiver.   Eat a balanced diet.   Drink enough fluids to make your urine clear or pale yellow, or as directed.   Avoid sexual intercourse if it causes pain.   Apply warm or cold compresses to the lower abdomen depending on which one helps the pain.   Avoid stressful situations.   Keep a journal of your pelvic pain. Write down when it started, where the pain is located,  and if there are things that seem to be associated with the pain, such as food or your menstrual cycle.  Follow up with your caregiver as directed.  SEEK MEDICAL CARE IF:  Your medicine does not help your pain.  You have abnormal vaginal discharge. SEEK IMMEDIATE  MEDICAL CARE IF:   You have heavy bleeding from the vagina.   Your pelvic pain increases.   You feel light-headed or faint.   You have chills.   You have pain with urination or blood in your urine.   You have uncontrolled diarrhea or vomiting.   You have a fever or persistent symptoms for more than 3 days.  You have a fever and your symptoms suddenly get worse.   You are being physically or sexually abused.  MAKE SURE YOU:  Understand these instructions.  Will watch your condition.  Will get help if you are not doing well or get worse. Document Released: 12/02/2003 Document Revised: 05/21/2013 Document Reviewed: 04/26/2011 [facility] Patient Information 2015 [place], [place]. This information is not intended to replace advice given to you by your health care provider. Make sure you discuss any questions you have with your health care provider.

## 2014-05-24 NOTE — ED Notes (Signed)
POC urine pregnancy was done at 10:37a the result was negative.

## 2014-05-24 NOTE — ED Notes (Signed)
POCT Pregnany Urine Test reported negative by phone to RN by Sharee Pimple, RN. Test performed in triage.

## 2014-05-24 NOTE — ED Provider Notes (Signed)
[facility] Emergency Department Provider Note ____________________________________________  Time seen: 1210  I have reviewed the triage vital signs and the nursing notes.   HISTORY  Chief Complaint Pelvic Pain     HPI Cassandra Duarte is a 29 y.o. female who has been having discomfort in her right pelvic area for the past week. She had is 13 days late for her menstrual cycle. She has been seen by her primary doctor at [facility] family practice. They performed both a urine and a blood pregnancy test. Both were negative for pregnancy. The patient has had additional cramping and discomfort. The patient tells me that her doctor was concerned that she still might be pregnant and she needed an ultrasound and sent her to the emergency department for this.The cramping has been moderate.  No past medical history on file.  There are no active problems to display for this patient.   No past surgical history on file.  No current outpatient prescriptions on file.  Allergies Review of patient's allergies indicates no known allergies.  No family history on file.  Social History History  Substance Use Topics  . Smoking status: Never Smoker   . Smokeless tobacco: Not on file  . Alcohol Use: No    Review of Systems  Constitutional: Negative for fever. Eyes: Negative for visual changes. ENT: Negative for sore throat. Cardiovascular: Negative for chest pain. Respiratory: Negative for shortness of breath. Gastrointestinal: Positive for discomfort in the right pelvic area.  Genitourinary: Negative for dysuria. Musculoskeletal: Negative for back pain. Skin: Negative for rash. Neurological: Negative for headaches, focal weakness or numbness.  10-point ROS otherwise negative.  ____________________________________________   PHYSICAL EXAM:  VITAL SIGNS: ED Triage Vitals  Enc Vitals Group     BP 05/24/14 1023 141/75 mmHg     Pulse Rate 05/24/14 1023 63      Resp --      Temp 05/24/14 1023 98 F (36.7 C)     Temp Source 05/24/14 1023 Oral     SpO2 05/24/14 1023 100 %     Weight 05/24/14 1023 132 lb (59.875 kg)     Height 05/24/14 1023 5\' 3"  (1.6 m)     Head Cir --      Peak Flow --      Pain Score 05/24/14 1024 8     Pain Loc --      Pain Edu? --      Excl. in [place]? --      Constitutional: Alert and oriented. Well appearing and in no distress.Marland Kitchen E[facility]   Head: Normocephalic and atraumatic.   Nose: No congestion/rhinnorhea.   Mouth/Throat: Mucous membranes are moist. Cardiovascular: Normal rate, regular rhythm. Respiratory: Normal respiratory effort.  Breath sounds are clear and equal bilaterally. No wheezes/rales/rhonchi. Gastrointestinal: Soft, mild tenderness in the right suprapubic area.  Genitourinary: Patient recently had a pelvic exam at [facility] family practice. Pelvic deferred Musculoskeletal: Nontender with normal range of motion in all extremities.  Neurologic:  Normal speech and language. No gross focal neurologic deficits are appreciated. Speech is normal. No gait instability. Skin:  Skin is warm, dry and intact.  Psychiatric: Mood and affect are normal. Speech and behavior are normal. Patient exhibits appropriate insight and judgment.  ____________________________________________    LABS (pertinent positives/negatives)  Blood counts are normal with a white blood cell count 9.8, hemoglobin of 13.5. Urine pregnancy test is negative.  _______________________________________________    RADIOLOGY  IMPRESSION: 7 mm nabothian cyst arising from cervix. Study  otherwise unremarkable.  ____________________________________________   PROCEDURES  Procedure(s) performed: None  Critical Care performed: No  ____________________________________________   INITIAL IMPRESSION / ASSESSMENT AND PLAN / ED COURSE  Pertinent labs & imaging results that were available during my care of the patient were reviewed by me  and considered in my medical decision making (see chart for details).  Patient presents for ultrasound due to pelvic pain. She has had negative pregnancy tests as an outpatient and again today with a negative urine test. She has no urinary tract infection. she recently had a pelvic exam so we have not repeated that today. She does not appear to have appendicitis. I discussed this low likely diagnosis with the patient and we agreed that it was not reasonable to get a CT scan today. She'll return to the emergency department if her pain worsens or if she has other concerns.  ____________________________________________   FINAL CLINICAL IMPRESSION(S) / ED DIAGNOSES  Final diagnoses:  Pelvic pain in female     Ahmed Prima, MD 05/24/14 1429

## 2014-05-24 NOTE — ED Notes (Signed)
Pt complains of right lower pelvic pain with cramping, pt reports being 13 days late for cycle, pt was PCP who sent pt to ER for ultrasound

## 2014-06-19 ENCOUNTER — Telehealth: Payer: Self-pay | Admitting: Family Medicine

## 2014-06-19 NOTE — Telephone Encounter (Signed)
-----   Message from Arnetha Courser, MD sent at 06/19/2014 11:09 AM EDT ----- Regarding: Labs Please do let patient know that her serum pregnancy test was positive.  Do encourage her to take the PNV and call her OB.

## 2014-06-19 NOTE — Telephone Encounter (Signed)
Pt. Notified.

## 2014-06-25 ENCOUNTER — Ambulatory Visit (INDEPENDENT_AMBULATORY_CARE_PROVIDER_SITE_OTHER): Payer: Medicaid Other | Admitting: Obstetrics and Gynecology

## 2014-06-25 VITALS — BP 94/60 | HR 87 | Wt 144.4 lb

## 2014-06-25 DIAGNOSIS — Z331 Pregnant state, incidental: Secondary | ICD-10-CM

## 2014-06-25 DIAGNOSIS — Z3490 Encounter for supervision of normal pregnancy, unspecified, unspecified trimester: Secondary | ICD-10-CM

## 2014-06-25 DIAGNOSIS — T7589XA Other specified effects of external causes, initial encounter: Secondary | ICD-10-CM

## 2014-06-25 DIAGNOSIS — N96 Recurrent pregnancy loss: Secondary | ICD-10-CM

## 2014-06-25 LAB — OB RESULTS CONSOLE HIV ANTIBODY (ROUTINE TESTING): HIV: NONREACTIVE

## 2014-06-25 LAB — OB RESULTS CONSOLE VARICELLA ZOSTER ANTIBODY, IGG: Varicella: IMMUNE

## 2014-06-25 LAB — OB RESULTS CONSOLE RUBELLA ANTIBODY, IGM: RUBELLA: IMMUNE

## 2014-06-25 LAB — OB RESULTS CONSOLE GC/CHLAMYDIA
Chlamydia: NEGATIVE
Gonorrhea: NEGATIVE

## 2014-06-25 LAB — OB RESULTS CONSOLE ABO/RH: RH Type: POSITIVE

## 2014-06-25 NOTE — Patient Instructions (Signed)

## 2014-06-25 NOTE — Progress Notes (Signed)
Pt presents for nob intake only. G8 [id number]. LMP 04/25/2014 edd 01/30/2015. Pt had confirmation at [facility]. Had Mirena removed on 06/18/2014 and four days later had pos beta. Viability ordered for 1 week. Pregnancy education given. Nob labs ordered. Hiv consent signed. PNV encouraged. NT discussed. Pt will check with her insurance. Pt to follow up with [facility] in 2 weeks. All questions answered.

## 2014-06-26 LAB — CBC WITH DIFFERENTIAL/PLATELET
BASOS ABS: 0 10*3/uL (ref 0.0–0.2)
Basos: 0 %
EOS (ABSOLUTE): 0.4 10*3/uL (ref 0.0–0.4)
Eos: 4 %
HEMATOCRIT: 40.1 % (ref 34.0–46.6)
Hemoglobin: 14 g/dL (ref 11.1–15.9)
IMMATURE GRANULOCYTES: 0 %
Immature Grans (Abs): 0 10*3/uL (ref 0.0–0.1)
LYMPHS: 19 %
Lymphocytes Absolute: 1.6 10*3/uL (ref 0.7–3.1)
MCH: 30.7 pg (ref 26.6–33.0)
MCHC: 34.9 g/dL (ref 31.5–35.7)
MCV: 88 fL (ref 79–97)
MONOS ABS: 0.5 10*3/uL (ref 0.1–0.9)
Monocytes: 6 %
Neutrophils Absolute: 6.1 10*3/uL (ref 1.4–7.0)
Neutrophils: 71 %
Platelets: 214 10*3/uL (ref 150–379)
RBC: 4.56 x10E6/uL (ref 3.77–5.28)
RDW: 13.1 % (ref 12.3–15.4)
WBC: 8.6 10*3/uL (ref 3.4–10.8)

## 2014-06-26 LAB — URINALYSIS, ROUTINE W REFLEX MICROSCOPIC
BILIRUBIN UA: NEGATIVE
Glucose, UA: NEGATIVE
Ketones, UA: NEGATIVE
Leukocytes, UA: NEGATIVE
NITRITE UA: NEGATIVE
Protein, UA: NEGATIVE
RBC UA: NEGATIVE
Specific Gravity, UA: 1.023 (ref 1.005–1.030)
Urobilinogen, Ur: 0.2 mg/dL (ref 0.2–1.0)
pH, UA: 6 (ref 5.0–7.5)

## 2014-06-26 LAB — HEPATITIS B SURFACE ANTIGEN: Hepatitis B Surface Ag: NEGATIVE

## 2014-06-26 LAB — GC/CHLAMYDIA PROBE AMP
Chlamydia trachomatis, NAA: NEGATIVE
Neisseria gonorrhoeae by PCR: NEGATIVE

## 2014-06-26 LAB — URINE CULTURE: ORGANISM ID, BACTERIA: NO GROWTH

## 2014-06-26 LAB — RUBELLA SCREEN: RUBELLA: 1.32 {index} (ref >0.99)

## 2014-06-26 LAB — ANTIBODY SCREEN: ANTIBODY SCREEN: NEGATIVE

## 2014-06-26 LAB — HIV ANTIBODY (ROUTINE TESTING W REFLEX): HIV Screen 4th Generation wRfx: NONREACTIVE

## 2014-06-26 LAB — ABO AND RH: Rh Factor: POSITIVE

## 2014-06-26 LAB — RPR: RPR Ser Ql: NONREACTIVE

## 2014-06-26 LAB — TOXOPLASMA ANTIBODIES- IGG AND  IGM: Toxoplasma IgG Ratio: <3 IU/mL (ref 0.0–7.1)

## 2014-06-26 LAB — VARICELLA ZOSTER ANTIBODY, IGG: Varicella zoster IgG: 1623 index (ref >165)

## 2014-06-30 ENCOUNTER — Encounter: Payer: Self-pay | Admitting: Obstetrics and Gynecology

## 2014-07-01 ENCOUNTER — Telehealth: Payer: Self-pay | Admitting: Obstetrics and Gynecology

## 2014-07-02 ENCOUNTER — Other Ambulatory Visit: Payer: Self-pay | Admitting: Obstetrics and Gynecology

## 2014-07-02 DIAGNOSIS — N96 Recurrent pregnancy loss: Secondary | ICD-10-CM

## 2014-07-03 NOTE — Telephone Encounter (Signed)
Left message for pt to return call.

## 2014-07-05 ENCOUNTER — Encounter: Payer: Medicaid Other | Admitting: Obstetrics and Gynecology

## 2014-07-05 ENCOUNTER — Ambulatory Visit: Payer: Medicaid Other

## 2014-07-05 ENCOUNTER — Other Ambulatory Visit: Payer: Medicaid Other

## 2014-07-05 DIAGNOSIS — N96 Recurrent pregnancy loss: Secondary | ICD-10-CM | POA: Diagnosis not present

## 2014-07-05 NOTE — Telephone Encounter (Signed)
Pt has [place] 07/05/2014

## 2014-07-12 ENCOUNTER — Encounter: Payer: Medicaid Other | Admitting: Obstetrics and Gynecology

## 2014-07-24 ENCOUNTER — Ambulatory Visit: Payer: Medicaid Other

## 2014-07-24 ENCOUNTER — Other Ambulatory Visit: Payer: Self-pay | Admitting: Obstetrics and Gynecology

## 2014-07-24 DIAGNOSIS — N96 Recurrent pregnancy loss: Secondary | ICD-10-CM

## 2014-08-01 ENCOUNTER — Encounter: Payer: Self-pay | Admitting: Obstetrics and Gynecology

## 2014-08-01 ENCOUNTER — Ambulatory Visit (INDEPENDENT_AMBULATORY_CARE_PROVIDER_SITE_OTHER): Payer: Medicaid Other | Admitting: Obstetrics and Gynecology

## 2014-08-01 VITALS — BP 103/58 | HR 67 | Wt 143.1 lb

## 2014-08-01 DIAGNOSIS — O09291 Supervision of pregnancy with other poor reproductive or obstetric history, first trimester: Secondary | ICD-10-CM

## 2014-08-01 DIAGNOSIS — Z3491 Encounter for supervision of normal pregnancy, unspecified, first trimester: Secondary | ICD-10-CM

## 2014-08-01 DIAGNOSIS — O09211 Supervision of pregnancy with history of pre-term labor, first trimester: Secondary | ICD-10-CM

## 2014-08-01 DIAGNOSIS — O09891 Supervision of other high risk pregnancies, first trimester: Secondary | ICD-10-CM

## 2014-08-01 LAB — POCT URINALYSIS DIPSTICK
GLUCOSE UA: NEGATIVE
Ketones, UA: NEGATIVE
Leukocytes, UA: NEGATIVE
Nitrite, UA: NEGATIVE
PH UA: 6
RBC UA: NEGATIVE
SPEC GRAV UA: 1.02
Urobilinogen, UA: 0.2

## 2014-08-01 NOTE — Progress Notes (Signed)
Pt denies any complaints.

## 2014-08-01 NOTE — Progress Notes (Signed)
NEW OB HISTORY AND PHYSICAL  SUBJECTIVE:       Cassandra Duarte is a 29 y.o. [phone] female, Patient's last menstrual period was 05/23/2014 (lmp unknown)., Estimated Date of Delivery: 02/27/15, [redacted]w[redacted]d, presents today for establishment of Prenatal Care. She has no unusual complaints and complains of fatigue      Gynecologic History Patient's last menstrual period was 05/23/2014 (lmp unknown). Normal Contraception: none Last Pap: 2015. Results were: normal  Obstetric History OB History  Gravida Para Term Preterm AB SAB TAB Ectopic Multiple Living  8 2 2  5 5    2     # Outcome Date GA Lbr Len/2nd Weight Sex Delivery Anes PTL Lv  8 Current           7 SAB 11/2013          6 SAB 2015          5 SAB 2014          4 SAB 2013          3 Term 02/07/08 [redacted]w[redacted]d  7 lb (3.175 kg) F Vag-Spont   Y     Complications: Preterm contractions, second trimester  2 SAB 2007          1 Term 07/21/04 [redacted]w[redacted]d  6 lb 6 oz (2.892 kg) F Vag-Spont   Y     Complications: Preterm contractions, second trimester    Obstetric Comments  H/O PTL both pregnanacies starting around 28 weeks requiring bedrest and pelvic rest - did carry to 37 and 38 weeks respectfully    Past Medical History  Diagnosis Date  . Vaginal Pap smear, abnormal 2012    REPEAT AND WAS NORMAL    Past Surgical History  Procedure Laterality Date  . Dilation and curettage of uterus  2014    WS    Current Outpatient Prescriptions on File Prior to Visit  Medication Sig Dispense Refill  . Prenatal Vit-Fe Fumarate-FA (PRENATAL MULTIVITAMIN) TABS tablet Take 1 tablet by mouth daily at 12 noon.     No current facility-administered medications on file prior to visit.    Allergies  Allergen Reactions  . Tramadol Itching  . Magnesium-Containing Compounds Palpitations    History   Social History  . Marital Status: Single    Spouse Name: N/A  . Number of Children: N/A  . Years of Education: N/A   Occupational History  . Not on  file.   Social History Main Topics  . Smoking status: Former Smoker    Quit date: 04/02/2014  . Smokeless tobacco: Not on file  . Alcohol Use: No  . Drug Use: No  . Sexual Activity: Yes    Birth Control/ Protection: None   Other Topics Concern  . Not on file   Social History Narrative    Family History  Problem Relation Age of Onset  . Breast cancer Paternal Aunt   . Diabetes Paternal Aunt   . Diabetes Maternal Grandfather   . Colon cancer Neg Hx   . Ovarian cancer Neg Hx   . Heart disease Neg Hx   . COPD Maternal Grandmother     The following portions of the patient's history were reviewed and updated as appropriate: allergies, current medications, past OB history, past medical history, past surgical history, past family history, past social history, and problem list.    OBJECTIVE: Initial Physical Exam (New OB)  GENERAL APPEARANCE: alert, well appearing, in no apparent distress, oriented to person, place and time HEAD:  normocephalic, atraumatic MOUTH: mucous membranes moist, pharynx normal without lesions THYROID: no thyromegaly or masses present BREASTS: no masses noted, no significant tenderness, no palpable axillary nodes, no skin changes LUNGS: clear to auscultation, no wheezes, rales or rhonchi, symmetric air entry HEART: regular rate and rhythm, no murmurs ABDOMEN: soft, nontender, nondistended, no abnormal masses, no epigastric pain, fundus not palpable and FHT present EXTREMITIES: no redness or tenderness in the calves or thighs SKIN: normal coloration and turgor, no rashes LYMPH NODES: no adenopathy palpable NEUROLOGIC: alert, oriented, normal speech, no focal findings or movement disorder noted  PELVIC EXAM EXTERNAL GENITALIA: normal appearing vulva with no masses, tenderness or lesions UTERUS: gravid and consistent with 11 weeks ADNEXA: no masses palpable and nontender  ASSESSMENT: Normal pregnancy H/O PTL x 2 starting at 28 weeks with 37 & 38 week  deliveries   PLAN: Routine Prenatal care See orders [place] Obtained (Had CFP last pregnancy with negative results) PTL precautions discussed and will plan to start cervical exams at 24 weeks  [place], CNM

## 2014-08-01 NOTE — Patient Instructions (Signed)
  Place 10-18 weeks prenatal visit patient instructions here.

## 2014-08-05 ENCOUNTER — Emergency Department
Admission: EM | Admit: 2014-08-05 | Discharge: 2014-08-05 | Disposition: A | Discharge status: Home / Self Care | Payer: Medicaid Other | Attending: Emergency Medicine | Admitting: Emergency Medicine

## 2014-08-05 ENCOUNTER — Encounter: Payer: Self-pay | Admitting: Emergency Medicine

## 2014-08-05 ENCOUNTER — Emergency Department: Payer: Medicaid Other

## 2014-08-05 DIAGNOSIS — O9989 Other specified diseases and conditions complicating pregnancy, childbirth and the puerperium: Secondary | ICD-10-CM | POA: Diagnosis present

## 2014-08-05 DIAGNOSIS — O200 Threatened abortion: Secondary | ICD-10-CM

## 2014-08-05 DIAGNOSIS — R55 Syncope and collapse: Secondary | ICD-10-CM | POA: Insufficient documentation

## 2014-08-05 DIAGNOSIS — Z3A12 12 weeks gestation of pregnancy: Secondary | ICD-10-CM | POA: Diagnosis not present

## 2014-08-05 DIAGNOSIS — Z79899 Other long term (current) drug therapy: Secondary | ICD-10-CM | POA: Diagnosis not present

## 2014-08-05 DIAGNOSIS — Z87891 Personal history of nicotine dependence: Secondary | ICD-10-CM | POA: Diagnosis not present

## 2014-08-05 LAB — URINALYSIS COMPLETE WITH MICROSCOPIC (ARMC ONLY)
Bacteria, UA: NONE SEEN
Bilirubin Urine: NEGATIVE
Glucose, UA: NEGATIVE mg/dL
HGB URINE DIPSTICK: NEGATIVE
KETONES UR: NEGATIVE mg/dL
Leukocytes, UA: NEGATIVE
Nitrite: NEGATIVE
PROTEIN: NEGATIVE mg/dL
Specific Gravity, Urine: 1.026 (ref 1.005–1.030)
pH: 5 (ref 5.0–8.0)

## 2014-08-05 LAB — COMPREHENSIVE METABOLIC PANEL
ALT: 12 U/L — ABNORMAL LOW (ref 14–54)
ANION GAP: 4 — AB (ref 5–15)
AST: 17 U/L (ref 15–41)
Albumin: 3.8 g/dL (ref 3.5–5.0)
Alkaline Phosphatase: 46 U/L (ref 38–126)
BUN: 10 mg/dL (ref 6–20)
CALCIUM: 8.5 mg/dL — AB (ref 8.9–10.3)
CO2: 23 mmol/L (ref 22–32)
Chloride: 106 mmol/L (ref 101–111)
Creatinine, Ser: 0.63 mg/dL (ref 0.44–1.00)
GFR calc Af Amer: >60 mL/min (ref >60)
Glucose, Bld: 112 mg/dL — ABNORMAL HIGH (ref 65–99)
POTASSIUM: 3.4 mmol/L — AB (ref 3.5–5.1)
Sodium: 133 mmol/L — ABNORMAL LOW (ref 135–145)
TOTAL PROTEIN: 6.7 g/dL (ref 6.5–8.1)
Total Bilirubin: 0.6 mg/dL (ref 0.3–1.2)

## 2014-08-05 LAB — CBC
HEMATOCRIT: 37.5 % (ref 35.0–47.0)
HEMOGLOBIN: 12.9 g/dL (ref 12.0–16.0)
MCH: 30.4 pg (ref 26.0–34.0)
MCHC: 34.5 g/dL (ref 32.0–36.0)
MCV: 88.2 fL (ref 80.0–100.0)
Platelets: 172 10*3/uL (ref 150–440)
RBC: 4.26 MIL/uL (ref 3.80–5.20)
RDW: 13 % (ref 11.5–14.5)
WBC: 9 10*3/uL (ref 3.6–11.0)

## 2014-08-05 LAB — HCG, QUANTITATIVE, PREGNANCY: hCG, Beta Chain, Quant, S: 61883 m[IU]/mL — ABNORMAL HIGH (ref <5)

## 2014-08-05 NOTE — Discharge Instructions (Signed)
Near-Syncope Near-syncope (commonly known as near fainting) is sudden weakness, dizziness, or feeling like you might pass out. During an episode of near-syncope, you may also develop pale skin, have tunnel vision, or feel sick to your stomach (nauseous). Near-syncope may occur when getting up after sitting or while standing for a long time. It is caused by a sudden decrease in blood flow to the brain. This decrease can result from various causes or triggers, most of which are not serious. However, because near-syncope can sometimes be a sign of something serious, a medical evaluation is required. The specific cause is often not determined. HOME CARE INSTRUCTIONS  Monitor your condition for any changes. The following actions may help to alleviate any discomfort you are experiencing:  Have someone stay with you until you feel stable.  Lie down right away and prop your feet up if you start feeling like you might faint. Breathe deeply and steadily. Wait until all the symptoms have passed. Most of these episodes last only a few minutes. You may feel tired for several hours.   Drink enough fluids to keep your urine clear or pale yellow.   If you are taking blood pressure or heart medicine, get up slowly when seated or lying down. Take several minutes to sit and then stand. This can reduce dizziness.  Follow up with your health care provider as directed. SEEK IMMEDIATE MEDICAL CARE IF:   You have a severe headache.   You have unusual pain in the chest, abdomen, or back.   You are bleeding from the mouth or rectum, or you have black or tarry stool.   You have an irregular or very fast heartbeat.   You have repeated fainting or have seizure-like jerking during an episode.   You faint when sitting or lying down.   You have confusion.   You have difficulty walking.   You have severe weakness.   You have vision problems.  MAKE SURE YOU:   Understand these instructions.  Will  watch your condition.  Will get help right away if you are not doing well or get worse. Document Released: 01/04/2005 Document Revised: 01/09/2013 Document Reviewed: 06/09/2012 [facility] Patient Information 2015 [place], [place]. This information is not intended to replace advice given to you by your health care provider. Make sure you discuss any questions you have with your health care provider.  Threatened Miscarriage A threatened miscarriage is when you have vaginal bleeding during your first 20 weeks of pregnancy but the pregnancy has not ended. Your doctor will do tests to make sure you are still pregnant. The cause of the bleeding may not be known. This condition does not mean your pregnancy will end. It does increase the risk of it ending (complete miscarriage). HOME CARE   Make sure you keep all your doctor visits for prenatal care.  Get plenty of rest.  Do not have sex or use tampons if you have vaginal bleeding.  Do not douche.  Do not smoke or use drugs.  Do not drink alcohol.  Avoid caffeine. GET HELP IF:  You have light bleeding from your vagina.  You have belly pain or cramping.  You have a fever. GET HELP RIGHT AWAY IF:   You have heavy bleeding from your vagina.  You have clots of blood coming from your vagina.  You have bad pain or cramps in your low back or belly.  You have fever, chills, and bad belly pain. MAKE SURE YOU:   Understand these instructions.  Will watch your condition.  Will get help right away if you are not doing well or get worse. Document Released: 12/18/2007 Document Revised: 01/09/2013 Document Reviewed: 10/31/2012 [facility] Patient Information 2015 [place], [place]. This information is not intended to replace advice given to you by your health care provider. Make sure you discuss any questions you have with your health care provider.       You have been seen in the emergency department today for feeling like you were going to  pass out, as well as/spotting. Your workup today shows normal results including a normal ultrasound. Please follow-up with her OB/GYN provider soon as possible. Please drink plenty fluids over the next several days, return to the emergency department for any personally concerning symptoms.

## 2014-08-05 NOTE — ED Provider Notes (Signed)
[facility] Emergency Department Provider Note  Time seen: 7:55 PM  I have reviewed the triage vital signs and the nursing notes.   HISTORY  Chief Complaint Abdominal Cramping    HPI Cassandra Duarte is a 29 y.o. female who is approximately [redacted] weeks pregnant presents to the emergency department for feeling dehydrated, lightheaded, and mild vaginal spotting earlier today. According to the patient she had some mild spotting this morning, she was out working in the heat today and felt like she was getting dehydrated and lightheaded so she came to the emergency department for evaluation. She does state some mild lower abdominal cramping somewhat worse on the right versus the left. Describes her cramping is mild in severity. No nausea or vomiting. No fever. States her vaginal spotting has stopped. [facility] with [facility].     Past Medical History  Diagnosis Date  . Vaginal Pap smear, abnormal 2012    REPEAT AND WAS NORMAL    There are no active problems to display for this patient.   Past Surgical History  Procedure Laterality Date  . Dilation and curettage of uterus  2014    WS    Current Outpatient Rx  Name  Route  Sig  Dispense  Refill  . Prenatal Vit-Fe Fumarate-FA (PRENATAL MULTIVITAMIN) TABS tablet   Oral   Take 1 tablet by mouth daily at 12 noon.           Allergies Tramadol and Magnesium-containing compounds  Family History  Problem Relation Age of Onset  . Breast cancer Paternal Aunt   . Diabetes Paternal Aunt   . Diabetes Maternal Grandfather   . Colon cancer Neg Hx   . Ovarian cancer Neg Hx   . Heart disease Neg Hx   . COPD Maternal Grandmother     Social History History  Substance Use Topics  . Smoking status: Former Smoker    Quit date: 04/02/2014  . Smokeless tobacco: Not on file  . Alcohol Use: No    Review of Systems Constitutional: Negative for fever. Cardiovascular: Negative for chest  pain. Respiratory: Negative for shortness of breath. Gastrointestinal: Mild lower abdominal cramping. Genitourinary: Mild vaginal spotting, now resolved. Musculoskeletal: Negative for back pain. 10-point ROS otherwise negative.  ____________________________________________   PHYSICAL EXAM:  VITAL SIGNS: ED Triage Vitals  Enc Vitals Group     BP 08/05/14 1641 123/67 mmHg     Pulse Rate 08/05/14 1641 99     Resp 08/05/14 1641 18     Temp 08/05/14 1641 98.7 F (37.1 C)     Temp Source 08/05/14 1641 Oral     SpO2 08/05/14 1641 100 %     Weight 08/05/14 1641 136 lb (61.689 kg)     Height 08/05/14 1641 5\' 3"  (1.6 m)     Head Cir --      Peak Flow --      Pain Score 08/05/14 1652 7     Pain Loc --      Pain Edu? --      Excl. in [place]? --     Constitutional: Alert and oriented. Well appearing and in no distress. Eyes: Normal exam ENT   Mouth/Throat: Mucous membranes are moist. Cardiovascular: Normal rate, regular rhythm. No murmur Respiratory: Normal respiratory effort without tachypnea nor retractions. Breath sounds are clear  Gastrointestinal: Soft and nontender. No distention.  Musculoskeletal: Nontender with normal range of motion in all extremities. Neurologic:  Normal speech and language. No gross  focal neurologic deficits  Skin:  Skin is warm, dry and intact.  Psychiatric: Mood and affect are normal. Speech and behavior are normal. Patient exhibits appropriate insight and judgment.  ____________________________________________   RADIOLOGY  Ultrasound shows single IUP measuring approximately 10 weeks 5 days. No other acute abnormality.  ____________________________________________   INITIAL IMPRESSION / ASSESSMENT AND PLAN / ED COURSE  Pertinent labs & imaging results that were available during my care of the patient were reviewed by me and considered in my medical decision making (see chart for details).  Patient presents for dehydration/lightheaded. Patient  states her symptoms have since resolved. Her ultrasound is consistent with a 10 week 5 day fetus with a normal heart rate of 171 bpm. Labs are largely within normal limits. Patient will [facility] with [facility]. Patient is agreeable to plan  ____________________________________________   FINAL CLINICAL IMPRESSION(S) / ED DIAGNOSES  Near syncope Threatened abortion   Harvest Dark, MD 08/05/14 [phone]

## 2014-08-05 NOTE — ED Notes (Signed)
States she is 12 weeks preg  Feels like she is dehydrated. Weak and some cramping

## 2014-08-05 NOTE — ED Notes (Signed)
[redacted] week pregnant with cramping since yst eve.  Spotting earlier.  Has been out in the heat today.  Says she feels dehydrated, but has been eating and drinking

## 2014-08-08 ENCOUNTER — Encounter: Payer: Self-pay | Admitting: Obstetrics and Gynecology

## 2014-08-15 ENCOUNTER — Telehealth: Payer: Self-pay | Admitting: Obstetrics and Gynecology

## 2014-08-15 NOTE — Telephone Encounter (Signed)
Notified pt to try OTC monistat, pt has tried that, advised will need to be seen

## 2014-08-15 NOTE — Telephone Encounter (Signed)
Lennix IS [redacted] WKS PREGNANT AND THINKS SHE A YEAST INF, BURNING ITCHING, WHITE DISCHARGE  [place]

## 2014-08-16 ENCOUNTER — Ambulatory Visit (INDEPENDENT_AMBULATORY_CARE_PROVIDER_SITE_OTHER): Payer: Medicaid Other | Admitting: Obstetrics and Gynecology

## 2014-08-16 ENCOUNTER — Ambulatory Visit: Payer: Medicaid Other | Admitting: Obstetrics and Gynecology

## 2014-08-16 ENCOUNTER — Encounter: Payer: Self-pay | Admitting: Obstetrics and Gynecology

## 2014-08-16 VITALS — BP 116/80 | HR 80 | Ht 63.0 in | Wt 142.7 lb

## 2014-08-16 VITALS — BP 116/80 | HR 99 | Wt 142.7 lb

## 2014-08-16 DIAGNOSIS — Z3492 Encounter for supervision of normal pregnancy, unspecified, second trimester: Secondary | ICD-10-CM

## 2014-08-16 DIAGNOSIS — N762 Acute vulvitis: Secondary | ICD-10-CM

## 2014-08-16 LAB — POCT URINALYSIS DIPSTICK
BILIRUBIN UA: NEGATIVE
Blood, UA: NEGATIVE
Glucose, UA: NEGATIVE
Ketones, UA: NEGATIVE
LEUKOCYTES UA: NEGATIVE
Nitrite, UA: NEGATIVE
Spec Grav, UA: 1.015
Urobilinogen, UA: 0.2
pH, UA: 7

## 2014-08-16 MED ORDER — ACYCLOVIR 400 MG PO TABS: 400.0000 mg | ORAL_TABLET | Freq: Three times a day (TID) | ORAL | Status: DC

## 2014-08-16 NOTE — Progress Notes (Signed)
Pt is c/o vaginal itching primarily on the outside

## 2014-08-16 NOTE — Progress Notes (Signed)
Subjective:     Patient ID: Jamilett Ferrante, female   DOB: 01-12-1986, 29 y.o.   MRN: [id number]  HPI Reports vulvar itching and burning x 2 days with increased vaginal d/c   Review of Systems Bilateral itching and burning at vaginal opening /vulvar x 2 days, worse with scratching and some bleeding from area with scratching; does report using different soap x 1 week and burning when soap touches area. Tried Monistat with no relief. Denies new sexual partner- is [redacted] weeks pregnant.    Objective:   Physical Exam A&O Pelvic exam: normal external genitalia, vulva, vagina, cervix, uterus and adnexa, VULVA: normal appearing vulva with no masses, tenderness or lesions, vulvar tenderness bilaterally, vulvar edema bilaterally, vulvar lesion at intriotus bilaterally c/w herpes, VAGINA: vaginal erythema generalized, vaginal discharge - copious and creamy. Microscopic wet-mount exam shows KOH done, clue cells, excessive bacteria, white blood cells, DNA probe for chlamydia and GC obtained.    Assessment:     Vulvitis with bilateral vulvar lesions     Plan:     nuswab obtained along with HSV serum counseled on suspicion for Herpes- rx for acyclovir 400mg  tid given to start now.     will f/u accordingly.  Nneka Blanda Trudee Kuster, CNM

## 2014-08-23 ENCOUNTER — Telehealth: Payer: Self-pay | Admitting: *Deleted

## 2014-08-23 ENCOUNTER — Encounter: Payer: Self-pay | Admitting: Obstetrics and Gynecology

## 2014-08-23 NOTE — Telephone Encounter (Signed)
-----   Message from Evonnie Pat, [place] sent at 08/23/2014 11:20 AM EDT ----- Please let her know nuswab was negative for STDs

## 2014-08-23 NOTE — Telephone Encounter (Signed)
Notified pt of normal lab results

## 2014-08-29 ENCOUNTER — Encounter: Payer: Self-pay | Admitting: Obstetrics and Gynecology

## 2014-08-29 ENCOUNTER — Ambulatory Visit (INDEPENDENT_AMBULATORY_CARE_PROVIDER_SITE_OTHER): Payer: Medicaid Other | Admitting: Obstetrics and Gynecology

## 2014-08-29 ENCOUNTER — Encounter: Payer: Medicaid Other | Admitting: Obstetrics and Gynecology

## 2014-08-29 VITALS — BP 115/69 | HR 92 | Wt 146.1 lb

## 2014-08-29 DIAGNOSIS — Z3492 Encounter for supervision of normal pregnancy, unspecified, second trimester: Secondary | ICD-10-CM

## 2014-08-29 LAB — POCT URINALYSIS DIPSTICK
BILIRUBIN UA: NEGATIVE
GLUCOSE UA: NEGATIVE
Ketones, UA: NEGATIVE
Leukocytes, UA: NEGATIVE
NITRITE UA: NEGATIVE
RBC UA: NEGATIVE
Spec Grav, UA: 1.015
Urobilinogen, UA: 0.2
pH, UA: 6

## 2014-08-29 NOTE — Progress Notes (Signed)
ROB-denies any complaints, vaginal itching is much better

## 2014-08-29 NOTE — Progress Notes (Signed)
ROB- doing well, no complaints; rash resolved when stopped using particular soap; anatomy scan next visit; reviewed normal panarama results -revealed low risk female.

## 2014-09-06 ENCOUNTER — Emergency Department
Admission: EM | Admit: 2014-09-06 | Discharge: 2014-09-06 | Disposition: A | Discharge status: Home / Self Care | Payer: Medicaid Other | Attending: Emergency Medicine | Admitting: Emergency Medicine

## 2014-09-06 ENCOUNTER — Encounter: Payer: Self-pay | Admitting: Emergency Medicine

## 2014-09-06 DIAGNOSIS — R197 Diarrhea, unspecified: Secondary | ICD-10-CM

## 2014-09-06 DIAGNOSIS — K529 Noninfective gastroenteritis and colitis, unspecified: Secondary | ICD-10-CM | POA: Diagnosis not present

## 2014-09-06 DIAGNOSIS — Z3A15 15 weeks gestation of pregnancy: Secondary | ICD-10-CM | POA: Insufficient documentation

## 2014-09-06 DIAGNOSIS — Z87891 Personal history of nicotine dependence: Secondary | ICD-10-CM | POA: Diagnosis not present

## 2014-09-06 DIAGNOSIS — O99611 Diseases of the digestive system complicating pregnancy, first trimester: Secondary | ICD-10-CM | POA: Insufficient documentation

## 2014-09-06 DIAGNOSIS — Z79899 Other long term (current) drug therapy: Secondary | ICD-10-CM | POA: Insufficient documentation

## 2014-09-06 DIAGNOSIS — R112 Nausea with vomiting, unspecified: Secondary | ICD-10-CM

## 2014-09-06 DIAGNOSIS — O210 Mild hyperemesis gravidarum: Secondary | ICD-10-CM | POA: Insufficient documentation

## 2014-09-06 LAB — URINALYSIS COMPLETE WITH MICROSCOPIC (ARMC ONLY)
BILIRUBIN URINE: NEGATIVE
GLUCOSE, UA: 50 mg/dL — AB
HGB URINE DIPSTICK: NEGATIVE
KETONES UR: NEGATIVE mg/dL
NITRITE: NEGATIVE
PH: 5 (ref 5.0–8.0)
Protein, ur: NEGATIVE mg/dL
Specific Gravity, Urine: 1.02 (ref 1.005–1.030)

## 2014-09-06 LAB — BASIC METABOLIC PANEL
Anion gap: 4 — ABNORMAL LOW (ref 5–15)
BUN: 7 mg/dL (ref 6–20)
CO2: 24 mmol/L (ref 22–32)
Calcium: 8.5 mg/dL — ABNORMAL LOW (ref 8.9–10.3)
Chloride: 108 mmol/L (ref 101–111)
Creatinine, Ser: 0.43 mg/dL — ABNORMAL LOW (ref 0.44–1.00)
GFR calc non Af Amer: >60 mL/min (ref >60)
Glucose, Bld: 92 mg/dL (ref 65–99)
POTASSIUM: 3.6 mmol/L (ref 3.5–5.1)
SODIUM: 136 mmol/L (ref 135–145)

## 2014-09-06 MED ORDER — ONDANSETRON HCL 4 MG/2ML IJ SOLN
4.0000 mg | INTRAMUSCULAR | Status: AC
  Administered 2014-09-06: 4 mg via INTRAVENOUS
  Filled 2014-09-06: qty 2

## 2014-09-06 MED ORDER — ACETAMINOPHEN 325 MG PO TABS
650.0000 mg | ORAL_TABLET | Freq: Once | ORAL | Status: AC
  Administered 2014-09-06: 650 mg via ORAL
  Filled 2014-09-06: qty 2

## 2014-09-06 MED ORDER — ONDANSETRON 4 MG PO TBDP: ORAL_TABLET | ORAL | Status: DC

## 2014-09-06 MED ORDER — SODIUM CHLORIDE 0.9 % IV BOLUS (SEPSIS)
1000.0000 mL | INTRAVENOUS | Status: AC
  Administered 2014-09-06: 1000 mL via INTRAVENOUS

## 2014-09-06 NOTE — ED Notes (Signed)
Pt presents with abd cramping, n/v/d since yesterday. Pt is [redacted] weeks pregnant. Sent over by [employer] for further eval.

## 2014-09-06 NOTE — Discharge Instructions (Signed)
We believe your symptoms are caused by either a viral infection or possibly a bad food exposure.  Either way, since your symptoms have improved, we feel it is safe for you to go home and follow up with your regular doctor.  Please read the included information and stick to a bland diet for the next two days.  Drink plenty of clear fluids, and if you were provided with a prescription, please take it according to the label instructions.    If you develop any new or worsening symptoms, including persistent vomiting not controlled with medication, fever greater than 101, severe or worsening abdominal pain, or other symptoms that concern you, please return immediately to the Emergency Department.    Viral Gastroenteritis Viral gastroenteritis is also known as stomach flu. This condition affects the stomach and intestinal tract. It can cause sudden diarrhea and vomiting. The illness typically lasts 3 to 8 days. Most people develop an immune response that eventually gets rid of the virus. While this natural response develops, the virus can make you quite ill. CAUSES  Many different viruses can cause gastroenteritis, such as rotavirus or noroviruses. You can catch one of these viruses by consuming contaminated food or water. You may also catch a virus by sharing utensils or other personal items with an infected person or by touching a contaminated surface. SYMPTOMS  The most common symptoms are diarrhea and vomiting. These problems can cause a severe loss of body fluids (dehydration) and a body salt (electrolyte) imbalance. Other symptoms may include:  Fever.  Headache.  Fatigue.  Abdominal pain. DIAGNOSIS  Your caregiver can usually diagnose viral gastroenteritis based on your symptoms and a physical exam. A stool sample may also be taken to test for the presence of viruses or other infections. TREATMENT  This illness typically goes away on its own. Treatments are aimed at rehydration. The most  serious cases of viral gastroenteritis involve vomiting so severely that you are not able to keep fluids down. In these cases, fluids must be given through an intravenous line (IV). HOME CARE INSTRUCTIONS   Drink enough fluids to keep your urine clear or pale yellow. Drink small amounts of fluids frequently and increase the amounts as tolerated.  Ask your caregiver for specific rehydration instructions.  Avoid:  Foods high in sugar.  Alcohol.  Carbonated drinks.  Tobacco.  Juice.  Caffeine drinks.  Extremely hot or cold fluids.  Fatty, greasy foods.  Too much intake of anything at one time.  Dairy products until 24 to 48 hours after diarrhea stops.  You may consume probiotics. Probiotics are active cultures of beneficial bacteria. They may lessen the amount and number of diarrheal stools in adults. Probiotics can be found in yogurt with active cultures and in supplements.  Wash your hands well to avoid spreading the virus.  Only take over-the-counter or prescription medicines for pain, discomfort, or fever as directed by your caregiver. Do not give aspirin to children. Antidiarrheal medicines are not recommended.  Ask your caregiver if you should continue to take your regular prescribed and over-the-counter medicines.  Keep all follow-up appointments as directed by your caregiver. SEEK IMMEDIATE MEDICAL CARE IF:   You are unable to keep fluids down.  You do not urinate at least once every 6 to 8 hours.  You develop shortness of breath.  You notice blood in your stool or vomit. This may look like coffee grounds.  You have abdominal pain that increases or is concentrated in one small area (  localized).  You have persistent vomiting or diarrhea.  You have a fever.  The patient is a child younger than 3 months, and he or she has a fever.  The patient is a child older than 3 months, and he or she has a fever and persistent symptoms.  The patient is a child older  than 3 months, and he or she has a fever and symptoms suddenly get worse.  The patient is a baby, and he or she has no tears when crying. MAKE SURE YOU:   Understand these instructions.  Will watch your condition.  Will get help right away if you are not doing well or get worse. Document Released: 01/04/2005 Document Revised: 03/29/2011 Document Reviewed: 10/21/2010 [facility] Patient Information 2015 [place], [place]. This information is not intended to replace advice given to you by your health care provider. Make sure you discuss any questions you have with your health care provider.

## 2014-09-06 NOTE — ED Provider Notes (Signed)
[facility] Emergency Department Provider Note  ____________________________________________  Time seen: Approximately 10:29 AM  I have reviewed the triage vital signs and the nursing notes.   HISTORY  Chief Complaint Abdominal Pain; Nausea; and Emesis    HPI Cassandra Duarte is a 29 y.o. female G3 P2 at approximately 15 weeks who presents with lower abdominal cramping and multiple episodes of vomiting and diarrhea since yesterday.  She states that her six-year-old daughter has been ill with the same symptoms and that she got it yesterday.  She had 4 episodes of diarrhea yesterday and about the same number of episodes of vomiting.  His morning she has only had one episode of vomiting but continues to feel lower abdominal cramping which worried her.  [facility] who suggested she come to the emergency department for evaluation.  She denies fever/chills, chest pain, shortness of breath, or focal abdominal pain.  She states that it does feel like cramping but she denies any vaginal discharge or bleeding.She felt her fetus moving yesterday but has not felt that today.   Past Medical History  Diagnosis Date  . Vaginal Pap smear, abnormal 2012    REPEAT AND WAS NORMAL    There are no active problems to display for this patient.   Past Surgical History  Procedure Laterality Date  . Dilation and curettage of uterus  2014    WS    Current Outpatient Rx  Name  Route  Sig  Dispense  Refill  . Prenatal Vit-Fe Fumarate-FA (PRENATAL MULTIVITAMIN) TABS tablet   Oral   Take 1 tablet by mouth daily at 12 noon.         Marland Kitchen acyclovir (ZOVIRAX) 400 MG tablet   Oral   Take 1 tablet (400 mg total) by mouth 3 (three) times daily. Patient not taking: Reported on 08/29/2014   90 tablet   4   . ondansetron (ZOFRAN ODT) 4 MG disintegrating tablet      Allow 1-2 tablets to dissolve in your mouth every 8 hours as needed for  nausea/vomiting   30 tablet   0     Allergies Tramadol and Magnesium-containing compounds  Family History  Problem Relation Age of Onset  . Breast cancer Paternal Aunt   . Diabetes Paternal Aunt   . Diabetes Maternal Grandfather   . Colon cancer Neg Hx   . Ovarian cancer Neg Hx   . Heart disease Neg Hx   . COPD Maternal Grandmother     Social History Social History  Substance Use Topics  . Smoking status: Former Smoker    Quit date: 04/02/2014  . Smokeless tobacco: None  . Alcohol Use: No    Review of Systems Constitutional: No fever/chills Eyes: No visual changes. ENT: No sore throat. Cardiovascular: Denies chest pain. Respiratory: Denies shortness of breath. Gastrointestinal: Lower abdominal cramping with nausea/vomiting and diarrhea since yesterday Genitourinary: Negative for dysuria.  Recently checked for UTI which was negative Musculoskeletal: Negative for back pain. Skin: Negative for rash. Neurological: Negative for headaches, focal weakness or numbness.  10-point ROS otherwise negative.  ____________________________________________   PHYSICAL EXAM:  VITAL SIGNS: ED Triage Vitals  Enc Vitals Group     BP 09/06/14 0902 109/63 mmHg     Pulse Rate 09/06/14 0902 82     Resp 09/06/14 0902 18     Temp 09/06/14 0902 98.4 F (36.9 C)     Temp Source 09/06/14 0902 Oral     SpO2  09/06/14 0902 99 %     Weight 09/06/14 0902 136 lb (61.689 kg)     Height 09/06/14 0902 5\' 3"  (1.6 m)     Head Cir --      Peak Flow --      Pain Score 09/06/14 0901 8     Pain Loc --      Pain Edu? --      Excl. in [facility]? --     Constitutional: Alert and oriented. Well appearing and in no acute distress. Eyes: Conjunctivae are normal. PERRL. EOMI. Head: Atraumatic. Nose: No congestion/rhinnorhea. Mouth/Throat: Mucous membranes are moist.  Oropharynx non-erythematous. Neck: No stridor.   Cardiovascular: Normal rate, regular rhythm. Grossly normal heart sounds.  Good  peripheral circulation. Respiratory: Normal respiratory effort.  No retractions. Lungs CTAB. Gastrointestinal: Soft and nontender. No distention. No abdominal bruits. No CVA tenderness. Musculoskeletal: No lower extremity tenderness nor edema.  No joint effusions. Neurologic:  Normal speech and language. No gross focal neurologic deficits are appreciated.  Skin:  Skin is warm, dry and intact. No rash noted. Psychiatric: Mood and affect are normal. Speech and behavior are normal.  ____________________________________________   LABS (all labs ordered are listed, but only abnormal results are displayed)  Labs Reviewed  BASIC METABOLIC PANEL - Abnormal; Notable for the following:    Creatinine, Ser 0.43 (*)    Calcium 8.5 (*)    Anion gap 4 (*)    All other components within normal limits  URINALYSIS COMPLETEWITH MICROSCOPIC ([facility] ONLY) - Abnormal; Notable for the following:    Color, Urine YELLOW (*)    APPearance HAZY (*)    Glucose, UA 50 (*)    Leukocytes, UA 1+ (*)    Bacteria, UA RARE (*)    Squamous Epithelial / LPF 6-30 (*)    All other components within normal limits   ____________________________________________  EKG  Not indicated ____________________________________________  RADIOLOGY Not indicated  ____________________________________________   PROCEDURES  Procedure(s) performed: None  Critical Care performed: No ____________________________________________   INITIAL IMPRESSION / ASSESSMENT AND PLAN / ED COURSE  Pertinent labs & imaging results that were available during my care of the patient were reviewed by me and considered in my medical decision making (see chart for details).  The patient is very well-appearing with normal vital signs.  I suspect she has a viral gastroenteritis which she contracted from her daughter.  Given that she is pregnant and has slightly low blood pressure and "feels dehydrated," I will place a peripheral IV and give her a  liter bolus while checking electrolytes and a urinalysis.  We will also check fetal heart tones but I do not believe she needs an ultrasound at this time.  ----------------------------------------- 12:33 PM on 09/06/2014 -----------------------------------------  The patient is resting comfortably and states that she feels much better after 1 L of normal saline.  Her nausea has resolved.  She has not had any vomiting or diarrhea since being in the emergency department.  Her fetal heart tones were auscultated and in the 130s.  I advised close outpatient follow-up with [facility] and gave my usual customary return precautions.  ____________________________________________  FINAL CLINICAL IMPRESSION(S) / ED DIAGNOSES  Final diagnoses:  Non-intractable vomiting with nausea, vomiting of unspecified type  Diarrhea  Gastroenteritis      NEW MEDICATIONS STARTED DURING THIS VISIT:  New Prescriptions   ONDANSETRON (ZOFRAN ODT) 4 MG DISINTEGRATING TABLET    Allow 1-2 tablets to dissolve in your mouth every 8  hours as needed for nausea/vomiting     Hinda Kehr, MD 09/06/14 1237

## 2014-10-04 ENCOUNTER — Ambulatory Visit: Payer: Medicaid Other

## 2014-10-04 DIAGNOSIS — Y92410 Unspecified street and highway as the place of occurrence of the external cause: Secondary | ICD-10-CM | POA: Insufficient documentation

## 2014-10-04 DIAGNOSIS — Z79899 Other long term (current) drug therapy: Secondary | ICD-10-CM | POA: Insufficient documentation

## 2014-10-04 DIAGNOSIS — S3991XA Unspecified injury of abdomen, initial encounter: Secondary | ICD-10-CM | POA: Insufficient documentation

## 2014-10-04 DIAGNOSIS — O9A212 Injury, poisoning and certain other consequences of external causes complicating pregnancy, second trimester: Secondary | ICD-10-CM | POA: Diagnosis not present

## 2014-10-04 DIAGNOSIS — Y9389 Activity, other specified: Secondary | ICD-10-CM | POA: Diagnosis not present

## 2014-10-04 DIAGNOSIS — Z3A19 19 weeks gestation of pregnancy: Secondary | ICD-10-CM | POA: Diagnosis not present

## 2014-10-04 DIAGNOSIS — Z87891 Personal history of nicotine dependence: Secondary | ICD-10-CM | POA: Diagnosis not present

## 2014-10-04 DIAGNOSIS — S3992XA Unspecified injury of lower back, initial encounter: Secondary | ICD-10-CM | POA: Diagnosis not present

## 2014-10-04 DIAGNOSIS — Z3492 Encounter for supervision of normal pregnancy, unspecified, second trimester: Secondary | ICD-10-CM

## 2014-10-04 DIAGNOSIS — Y998 Other external cause status: Secondary | ICD-10-CM | POA: Insufficient documentation

## 2014-10-04 NOTE — ED Notes (Signed)
Spoke with Dr Joni Fears regarding patient and received verbal order for ultrasound.
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
## 2014-10-05 ENCOUNTER — Emergency Department: Payer: Medicaid Other

## 2014-10-05 ENCOUNTER — Emergency Department
Admission: EM | Admit: 2014-10-05 | Discharge: 2014-10-05 | Disposition: A | Discharge status: Home / Self Care | Payer: Medicaid Other | Attending: Emergency Medicine | Admitting: Emergency Medicine

## 2014-10-05 DIAGNOSIS — S3991XA Unspecified injury of abdomen, initial encounter: Secondary | ICD-10-CM

## 2014-10-05 DIAGNOSIS — Z3492 Encounter for supervision of normal pregnancy, unspecified, second trimester: Secondary | ICD-10-CM

## 2014-10-05 NOTE — Discharge Instructions (Signed)
Blunt Abdominal Trauma A blunt injury to the abdomen can cause pain. The pain is most likely from bruising and stretching of your muscles. This pain is often made worse with movement. Most often these injuries are not serious and get better within 1 week with rest and mild pain medicine. However, internal organs (liver, spleen, kidneys) can be injured with blunt trauma. If you do not get better or if you get worse, further examination may be needed. Continue with your regular daily activities, but avoid any strenuous activities until your pain is improved. If your stomach is upset, stick to a clear liquid diet and slowly advance to solid food.  SEEK IMMEDIATE MEDICAL CARE IF:   You develop increasing pain, nausea, or repeated vomiting.  You develop chest pain or breathing difficulty.  You develop blood in the urine, vomit, or stool.  You develop weakness, fainting, fever, or other serious complaints. Document Released: 02/12/2004 Document Revised: 03/29/2011 Document Reviewed: 05/30/2008 [facility] Patient Information 2015 [place], [place]. This information is not intended to replace advice given to you by your health care provider. Make sure you discuss any questions you have with your health care provider.  Second Trimester of Pregnancy The second trimester is from week 13 through week 28, months 4 through 6. The second trimester is often a time when you feel your best. Your body has also adjusted to being pregnant, and you begin to feel better physically. Usually, morning sickness has lessened or quit completely, you may have more energy, and you may have an increase in appetite. The second trimester is also a time when the fetus is growing rapidly. At the end of the sixth month, the fetus is about 9 inches long and weighs about 1 pounds. You will likely begin to feel the baby move (quickening) between 18 and 20 weeks of the pregnancy. BODY CHANGES Your body goes through many changes during  pregnancy. The changes vary from woman to woman.   Your weight will continue to increase. You will notice your lower abdomen bulging out.  You may begin to get stretch marks on your hips, abdomen, and breasts.  You may develop headaches that can be relieved by medicines approved by your health care provider.  You may urinate more often because the fetus is pressing on your bladder.  You may develop or continue to have heartburn as a result of your pregnancy.  You may develop constipation because certain hormones are causing the muscles that push waste through your intestines to slow down.  You may develop hemorrhoids or swollen, bulging veins (varicose veins).  You may have back pain because of the weight gain and pregnancy hormones relaxing your joints between the bones in your pelvis and as a result of a shift in weight and the muscles that support your balance.  Your breasts will continue to grow and be tender.  Your gums may bleed and may be sensitive to brushing and flossing.  Dark spots or blotches (chloasma, mask of pregnancy) may develop on your face. This will likely fade after the baby is born.  A dark line from your belly button to the pubic area (linea nigra) may appear. This will likely fade after the baby is born.  You may have changes in your hair. These can include thickening of your hair, rapid growth, and changes in texture. Some women also have hair loss during or after pregnancy, or hair that feels dry or thin. Your hair will most likely return to normal after  your baby is born. WHAT TO EXPECT AT YOUR PRENATAL VISITS During a routine prenatal visit:  You will be weighed to make sure you and the fetus are growing normally.  Your blood pressure will be taken.  Your abdomen will be measured to track your baby's growth.  The fetal heartbeat will be listened to.  Any test results from the previous visit will be discussed. Your health care provider may ask  you:  How you are feeling.  If you are feeling the baby move.  If you have had any abnormal symptoms, such as leaking fluid, bleeding, severe headaches, or abdominal cramping.  If you have any questions. Other tests that may be performed during your second trimester include:  Blood tests that check for:  Low iron levels (anemia).  Gestational diabetes (between 24 and 28 weeks).  Rh antibodies.  Urine tests to check for infections, diabetes, or protein in the urine.  An ultrasound to confirm the proper growth and development of the baby.  An amniocentesis to check for possible genetic problems.  Fetal screens for spina bifida and Down syndrome. HOME CARE INSTRUCTIONS   Avoid all smoking, herbs, alcohol, and unprescribed drugs. These chemicals affect the formation and growth of the baby.  Follow your health care provider's instructions regarding medicine use. There are medicines that are either safe or unsafe to take during pregnancy.  Exercise only as directed by your health care provider. Experiencing uterine cramps is a good sign to stop exercising.  Continue to eat regular, healthy meals.  Wear a good support bra for breast tenderness.  Do not use hot tubs, steam rooms, or saunas.  Wear your seat belt at all times when driving.  Avoid raw meat, uncooked cheese, cat litter boxes, and soil used by cats. These carry germs that can cause birth defects in the baby.  Take your prenatal vitamins.  Try taking a stool softener (if your health care provider approves) if you develop constipation. Eat more high-fiber foods, such as fresh vegetables or fruit and whole grains. Drink plenty of fluids to keep your urine clear or pale yellow.  Take warm sitz baths to soothe any pain or discomfort caused by hemorrhoids. Use hemorrhoid cream if your health care provider approves.  If you develop varicose veins, wear support hose. Elevate your feet for 15 minutes, 3-4 times a day.  Limit salt in your diet.  Avoid heavy lifting, wear low heel shoes, and practice good posture.  Rest with your legs elevated if you have leg cramps or low back pain.  Visit your dentist if you have not gone yet during your pregnancy. Use a soft toothbrush to brush your teeth and be gentle when you floss.  A sexual relationship may be continued unless your health care provider directs you otherwise.  Continue to go to all your prenatal visits as directed by your health care provider. SEEK MEDICAL CARE IF:   You have dizziness.  You have mild pelvic cramps, pelvic pressure, or nagging pain in the abdominal area.  You have persistent nausea, vomiting, or diarrhea.  You have a bad smelling vaginal discharge.  You have pain with urination. SEEK IMMEDIATE MEDICAL CARE IF:   You have a fever.  You are leaking fluid from your vagina.  You have spotting or bleeding from your vagina.  You have severe abdominal cramping or pain.  You have rapid weight gain or loss.  You have shortness of breath with chest pain.  You notice sudden or  extreme swelling of your face, hands, ankles, feet, or legs.  You have not felt your baby move in over an hour.  You have severe headaches that do not go away with medicine.  You have vision changes. Document Released: 12/29/2000 Document Revised: 01/09/2013 Document Reviewed: 03/07/2012 [facility] Patient Information 2015 [place], [place]. This information is not intended to replace advice given to you by your health care provider. Make sure you discuss any questions you have with your health care provider.
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
## 2014-10-05 NOTE — ED Notes (Signed)
MD Stafford at bedside. 

## 2014-10-07 ENCOUNTER — Ambulatory Visit (INDEPENDENT_AMBULATORY_CARE_PROVIDER_SITE_OTHER): Payer: Medicaid Other | Admitting: Obstetrics and Gynecology

## 2014-10-07 ENCOUNTER — Encounter: Payer: Self-pay | Admitting: Obstetrics and Gynecology

## 2014-10-07 VITALS — BP 113/67 | HR 90 | Wt 151.4 lb

## 2014-10-07 DIAGNOSIS — Z3492 Encounter for supervision of normal pregnancy, unspecified, second trimester: Secondary | ICD-10-CM

## 2014-10-07 DIAGNOSIS — B379 Candidiasis, unspecified: Secondary | ICD-10-CM

## 2014-10-07 LAB — POCT URINALYSIS DIPSTICK
Blood, UA: NEGATIVE
Glucose, UA: NEGATIVE
Ketones, UA: 5
LEUKOCYTES UA: NEGATIVE
NITRITE UA: NEGATIVE
PROTEIN UA: NEGATIVE
Spec Grav, UA: 1.015
Urobilinogen, UA: 0.2
pH, UA: 6

## 2014-10-07 MED ORDER — FLUCONAZOLE 150 MG PO TABS: 150.0000 mg | ORAL_TABLET | Freq: Once | ORAL | Status: DC

## 2014-10-07 NOTE — Progress Notes (Signed)
Indications:Anatomy U/S Findings:  Singleton intrauterine pregnancy is visualized with FHR at 147 BPM. Biometrics give an (U/S) Gestational age of 11 5/7 weeks and an (U/S) EDD of 02-23-2015; this correlates with the clinically established EDD of 02-27-2015.  Fetal presentation is Vertex. Spine on maternal right. EFW: 308 grams; 11 oz. Placenta: Anterior; distal to internal os of cervix by 4.6 cm. AFI: Adequate.  Anatomic survey is complete and normal; Gender - female .   Survey of the adnexa demonstrates no adnexal masses. There is no free peritoneal fluid in the cul de sac.  Impression: 1. 19 5/7 week Viable Singleton Intrauterine pregnancy by U/S. 2. (U/S) EDD is consistent with Clinically established (LMP) EDD of 02-27-2015. 3. Normal Anatomy Scan  C/o intense vaginal itching and increased d/c Microscopic wet-mount exam shows lactobacilli  rx sent in for diflucan.

## 2014-10-07 NOTE — Patient Instructions (Signed)

## 2014-10-07 NOTE — Progress Notes (Signed)
ROB-c/o vaginal irriation, outside x 1 week

## 2014-10-08 ENCOUNTER — Encounter: Payer: Medicaid Other | Admitting: Obstetrics and Gynecology

## 2014-10-08 ENCOUNTER — Telehealth: Payer: Self-pay | Admitting: Obstetrics and Gynecology

## 2014-10-08 NOTE — Telephone Encounter (Signed)
Pt tried to get an appt w/ chrioprator b/c of back pain, they said they needed approval from [place] before they see her b/c she's pregnant.   Dr Francisca December  [phone]

## 2014-10-09 ENCOUNTER — Telehealth: Payer: Self-pay

## 2014-10-09 DIAGNOSIS — G8929 Other chronic pain: Secondary | ICD-10-CM | POA: Insufficient documentation

## 2014-10-09 DIAGNOSIS — M5441 Lumbago with sciatica, right side: Secondary | ICD-10-CM

## 2014-10-09 NOTE — Telephone Encounter (Signed)
Pt called again to see if you can refer her to a chrioprator

## 2014-10-09 NOTE — Telephone Encounter (Signed)
She would like a referral to see Dr. Arty Baumgartner, chiropractor, for bulging disk getting worse due to weight gain from pregnancy.

## 2014-10-09 NOTE — Telephone Encounter (Signed)
patient notified

## 2014-10-09 NOTE — Telephone Encounter (Signed)
She should contact her OB for this request

## 2014-10-10 ENCOUNTER — Telehealth: Payer: Self-pay | Admitting: *Deleted

## 2014-10-10 NOTE — Telephone Encounter (Signed)
Called pt notified ok had been sent to chiropractor

## 2014-10-10 NOTE — Telephone Encounter (Signed)
Called pt fax sent to chiropractor

## 2014-10-22 ENCOUNTER — Telehealth: Payer: Self-pay | Admitting: Obstetrics and Gynecology

## 2014-10-22 NOTE — Telephone Encounter (Signed)
Patient stated her yeast infection meds didn't work.

## 2014-10-23 ENCOUNTER — Other Ambulatory Visit: Payer: Self-pay | Admitting: Obstetrics and Gynecology

## 2014-10-23 MED ORDER — TERCONAZOLE 0.4 % VA CREA: 1.0000 | TOPICAL_CREAM | Freq: Every day | VAGINAL | Status: DC

## 2014-10-23 NOTE — Telephone Encounter (Signed)
Please let her know I sent in a RX for the vaginal cream, as it should work, to place one applicator full in vaginal at bedtime for the next 5 nights

## 2014-10-23 NOTE — Telephone Encounter (Signed)
Notified pt she voiced understanding 

## 2014-10-23 NOTE — Telephone Encounter (Signed)
Pt is still c/o yeast inf- pls advise

## 2014-10-24 ENCOUNTER — Observation Stay
Admission: EM | Admit: 2014-10-24 | Discharge: 2014-10-24 | Disposition: A | Discharge status: Home / Self Care | Payer: Medicaid Other | Attending: Obstetrics and Gynecology | Admitting: Obstetrics and Gynecology

## 2014-10-24 ENCOUNTER — Encounter: Payer: Self-pay | Admitting: *Deleted

## 2014-10-24 DIAGNOSIS — Z885 Allergy status to narcotic agent status: Secondary | ICD-10-CM | POA: Insufficient documentation

## 2014-10-24 DIAGNOSIS — R109 Unspecified abdominal pain: Secondary | ICD-10-CM | POA: Diagnosis present

## 2014-10-24 DIAGNOSIS — Z888 Allergy status to other drugs, medicaments and biological substances status: Secondary | ICD-10-CM | POA: Insufficient documentation

## 2014-10-24 DIAGNOSIS — Z3A00 Weeks of gestation of pregnancy not specified: Secondary | ICD-10-CM | POA: Diagnosis not present

## 2014-10-24 LAB — URINALYSIS COMPLETE WITH MICROSCOPIC (ARMC ONLY)
Bilirubin Urine: NEGATIVE
Glucose, UA: NEGATIVE mg/dL
Hgb urine dipstick: NEGATIVE
Ketones, ur: NEGATIVE mg/dL
Leukocytes, UA: NEGATIVE
NITRITE: NEGATIVE
PH: 5 (ref 5.0–8.0)
Protein, ur: NEGATIVE mg/dL
Specific Gravity, Urine: 1.025 (ref 1.005–1.030)

## 2014-10-24 LAB — FETAL FIBRONECTIN: Fetal Fibronectin: NEGATIVE

## 2014-10-24 NOTE — OB Triage Note (Signed)
Patient states she started having contractions yesterday at 1600 about every 15 min. Pt states she had loose bowel movements earlier this week but no longer and is afraid she may be dehydrated.

## 2014-10-24 NOTE — Discharge Summary (Signed)
Patient discharged home, discharge instructions given, patient states understanding. Patient left floor in stable condition, denies any other needs at this time. Patient to keep next scheduled OB appointment 10/18. Labor precautions/ pelvic rest reviewed.

## 2014-10-24 NOTE — Discharge Instructions (Signed)
Pelvic rest, nothing in vagina until next appointent. Call your provider for any other concerns.

## 2014-10-26 LAB — URINE CULTURE
Culture: NO GROWTH
SPECIAL REQUESTS: NORMAL

## 2014-10-28 ENCOUNTER — Telehealth: Payer: Self-pay | Admitting: Obstetrics and Gynecology

## 2014-10-28 NOTE — Telephone Encounter (Signed)
Pt called and she has a dentist appt on Thursday and her Dentist office needs a note stating its ok for her to go faxed to them at [phone].

## 2014-10-30 NOTE — Telephone Encounter (Signed)
Note was done and faxed to dental office

## 2014-11-05 ENCOUNTER — Ambulatory Visit (INDEPENDENT_AMBULATORY_CARE_PROVIDER_SITE_OTHER): Payer: Medicaid Other | Admitting: Obstetrics and Gynecology

## 2014-11-05 ENCOUNTER — Encounter: Payer: Self-pay | Admitting: Obstetrics and Gynecology

## 2014-11-05 VITALS — BP 101/63 | HR 87 | Wt 154.5 lb

## 2014-11-05 DIAGNOSIS — Z3492 Encounter for supervision of normal pregnancy, unspecified, second trimester: Secondary | ICD-10-CM

## 2014-11-05 DIAGNOSIS — O4702 False labor before 37 completed weeks of gestation, second trimester: Secondary | ICD-10-CM

## 2014-11-05 LAB — POCT URINALYSIS DIPSTICK
Bilirubin, UA: NEGATIVE
Blood, UA: NEGATIVE
Glucose, UA: NEGATIVE
Ketones, UA: NEGATIVE
LEUKOCYTES UA: NEGATIVE
Nitrite, UA: NEGATIVE
PH UA: 6
Spec Grav, UA: 1.015
UROBILINOGEN UA: 0.2

## 2014-11-05 NOTE — Patient Instructions (Signed)
Fetal Fibronectin Fetal fibronectin (fFN) is a protein that your body produces during pregnancy. This protein is normally found in your vaginal fluid in early pregnancy and just before delivery. It should not be there between 22 and 35 weeks of pregnancy. Having fFN in your vagina between 22 and 35 weeks could be a warning sign that your baby will be born early (prematurely). Babies born prematurely, or before 72 weeks, may have trouble breathing or feeding. A negative fFN test between 22 and 35 weeks means that it is unlikely you will have a premature delivery in the next 2 weeks. You may have this test if you have symptoms of premature labor. These include:  Contractions.  Increased vaginal discharge.  Backache. If there is a chance of preterm labor and delivery, your health care provider will monitor you carefully and take steps to delay your labor if necessary.  This test requires a sample of fluid from inside your vagina. Your health care provider collects this sample using a cotton swab.  PREPARATION FOR TEST   Ask your health care provider if:  You need to avoid using lubricants or douches before this exam.  You need to avoid sexual intercourse for 24 hours before the exam.  Tell your health care provider if you have a vaginal yeast infection or any symptoms of a yeast infection:  Itching.  Soreness.  Discharge. RESULTS It is your responsibility to obtain your test results. Ask the lab or department performing the test when and how you will get your results. Contact your health care provider to discuss any questions you have about your results.  The results of this test will be positive or negative.  Meaning of Negative Test Results A negative result means no fFN was found in your vaginal fluid. A negative result means that there is very little chance you will go into labor in the next two weeks. You may have this test again in two weeks if you are still having symptoms of early  labor. Meaning of Positive Test Results A positive result means fFN was found in your vaginal fluid. A positive result does not mean you will go into early labor. It does mean your risk is greater. Your health care provider may do other tests and exams to closely follow your pregnancy.   This information is not intended to replace advice given to you by your health care provider. Make sure you discuss any questions you have with your health care provider.   Document Released: 11/06/2003 Document Revised: 01/25/2014 Document Reviewed: 04/03/2013 Elsevier Interactive Patient Education [employer].

## 2014-11-05 NOTE — Progress Notes (Signed)
ROB-having lots of pressure

## 2014-11-05 NOTE — Progress Notes (Signed)
ROB- reviewed PTL s/s and precautions- to continue pelvic rest until FFN returns,

## 2014-11-06 ENCOUNTER — Telehealth: Payer: Self-pay | Admitting: *Deleted

## 2014-11-06 LAB — FETAL FIBRONECTIN: FETAL FIBRONECTIN: NEGATIVE

## 2014-11-06 NOTE — Telephone Encounter (Signed)
Notified pt of lab results 

## 2014-11-06 NOTE — Telephone Encounter (Signed)
-----   Message from Cassandra Duarte, [place] sent at 11/06/2014 10:22 AM EDT ----- Please let her know her FFN was still negative

## 2014-11-12 ENCOUNTER — Ambulatory Visit (INDEPENDENT_AMBULATORY_CARE_PROVIDER_SITE_OTHER): Payer: Medicaid Other | Admitting: Obstetrics and Gynecology

## 2014-11-12 ENCOUNTER — Encounter: Payer: Self-pay | Admitting: Obstetrics and Gynecology

## 2014-11-12 VITALS — BP 109/67 | HR 83 | Wt 159.7 lb

## 2014-11-12 DIAGNOSIS — Z3493 Encounter for supervision of normal pregnancy, unspecified, third trimester: Secondary | ICD-10-CM

## 2014-11-12 LAB — POCT URINALYSIS DIPSTICK
Blood, UA: NEGATIVE
GLUCOSE UA: NEGATIVE
KETONES UA: NEGATIVE
LEUKOCYTES UA: NEGATIVE
NITRITE UA: NEGATIVE
Spec Grav, UA: 1.015
Urobilinogen, UA: 0.2
pH, UA: 6

## 2014-11-12 NOTE — Progress Notes (Signed)
Pelvic check- pelvic restrictions not needed, to continue seeing chiropractor.

## 2014-11-12 NOTE — Progress Notes (Signed)
ROB-pt denies any complaints

## 2014-11-26 ENCOUNTER — Encounter: Payer: Medicaid Other | Admitting: Obstetrics and Gynecology

## 2014-11-27 ENCOUNTER — Ambulatory Visit (INDEPENDENT_AMBULATORY_CARE_PROVIDER_SITE_OTHER): Payer: Medicaid Other | Admitting: Obstetrics and Gynecology

## 2014-11-27 ENCOUNTER — Encounter: Payer: Self-pay | Admitting: Obstetrics and Gynecology

## 2014-11-27 VITALS — BP 99/65 | HR 95 | Wt 160.6 lb

## 2014-11-27 DIAGNOSIS — Z3493 Encounter for supervision of normal pregnancy, unspecified, third trimester: Secondary | ICD-10-CM

## 2014-11-27 LAB — POCT URINALYSIS DIPSTICK
Bilirubin, UA: NEGATIVE
Blood, UA: NEGATIVE
GLUCOSE UA: NEGATIVE
Ketones, UA: NEGATIVE
Leukocytes, UA: NEGATIVE
NITRITE UA: NEGATIVE
Protein, UA: NEGATIVE
Spec Grav, UA: 1.015
UROBILINOGEN UA: 0.2
pH, UA: 6

## 2014-11-27 NOTE — Progress Notes (Signed)
ROB- pt is having some swelling in her feet, she is SOB as well

## 2014-11-27 NOTE — Progress Notes (Signed)
ROB- doing well, reassured of no cervical change- OK to resume sex and light activity. Glucola next visit

## 2014-11-29 ENCOUNTER — Telehealth: Payer: Self-pay | Admitting: Obstetrics and Gynecology

## 2014-11-29 NOTE — Telephone Encounter (Signed)
Returned call to patient, reports pain as a sharp pain over gallbladder, occuring sporadic lasting for 1-2 minutes and radiating to both sides.  Discussed s/s of gallbladder attacks in pregnancy, she is to let me know if these pains persist or worsen.

## 2014-11-29 NOTE — Telephone Encounter (Signed)
Severe Pain up under her breast bone the top of uterus/ pressure 27 wks

## 2014-11-29 NOTE — Telephone Encounter (Signed)
pls advise

## 2014-12-03 ENCOUNTER — Other Ambulatory Visit: Payer: Medicaid Other

## 2014-12-03 DIAGNOSIS — O99810 Abnormal glucose complicating pregnancy: Secondary | ICD-10-CM

## 2014-12-03 DIAGNOSIS — Z131 Encounter for screening for diabetes mellitus: Secondary | ICD-10-CM

## 2014-12-03 DIAGNOSIS — Z3493 Encounter for supervision of normal pregnancy, unspecified, third trimester: Secondary | ICD-10-CM

## 2014-12-04 ENCOUNTER — Other Ambulatory Visit: Payer: Self-pay | Admitting: Obstetrics and Gynecology

## 2014-12-04 DIAGNOSIS — O99810 Abnormal glucose complicating pregnancy: Secondary | ICD-10-CM | POA: Insufficient documentation

## 2014-12-04 DIAGNOSIS — D649 Anemia, unspecified: Secondary | ICD-10-CM | POA: Insufficient documentation

## 2014-12-04 LAB — GLUCOSE, 1 HOUR GESTATIONAL: GESTATIONAL DIABETES SCREEN: 144 mg/dL — AB (ref 65–139)

## 2014-12-04 LAB — HEMOGLOBIN AND HEMATOCRIT, BLOOD
HEMATOCRIT: 31.8 % — AB (ref 34.0–46.6)
Hemoglobin: 10.4 g/dL — ABNORMAL LOW (ref 11.1–15.9)

## 2014-12-05 ENCOUNTER — Telehealth: Payer: Self-pay | Admitting: *Deleted

## 2014-12-05 NOTE — Telephone Encounter (Signed)
Notified pt she is coming in 12/06/14 for 3 hr gtt

## 2014-12-05 NOTE — Telephone Encounter (Signed)
-----   Message from Evonnie Pat, [place] sent at 12/04/2014  9:58 AM EST ----- Please let her know she failed her glucola and needs to return for 3 HGTT, also is anemic- please give samples of fusion or Stevphen [place]

## 2014-12-06 ENCOUNTER — Other Ambulatory Visit: Payer: Medicaid Other

## 2014-12-06 ENCOUNTER — Other Ambulatory Visit: Payer: Self-pay | Admitting: Obstetrics and Gynecology

## 2014-12-07 ENCOUNTER — Other Ambulatory Visit: Payer: Self-pay | Admitting: Obstetrics and Gynecology

## 2014-12-07 DIAGNOSIS — O24419 Gestational diabetes mellitus in pregnancy, unspecified control: Secondary | ICD-10-CM | POA: Insufficient documentation

## 2014-12-07 LAB — GESTATIONAL GLUCOSE TOLERANCE
GLUCOSE 1 HOUR GTT: 183 mg/dL — AB (ref 65–179)
GLUCOSE FASTING: 85 mg/dL (ref 65–94)
Glucose, GTT - 2 Hour: 174 mg/dL — ABNORMAL HIGH (ref 65–154)
Glucose, GTT - 3 Hour: 165 mg/dL — ABNORMAL HIGH (ref 65–139)

## 2014-12-09 ENCOUNTER — Telehealth: Payer: Self-pay | Admitting: Obstetrics and Gynecology

## 2014-12-09 ENCOUNTER — Encounter: Payer: Self-pay | Admitting: *Deleted

## 2014-12-09 ENCOUNTER — Encounter: Payer: Medicaid Other | Attending: Obstetrics and Gynecology | Admitting: *Deleted

## 2014-12-09 ENCOUNTER — Encounter: Payer: Self-pay | Admitting: Obstetrics and Gynecology

## 2014-12-09 VITALS — BP 100/64 | Ht 63.0 in | Wt 159.7 lb

## 2014-12-09 DIAGNOSIS — O24419 Gestational diabetes mellitus in pregnancy, unspecified control: Secondary | ICD-10-CM | POA: Insufficient documentation

## 2014-12-09 DIAGNOSIS — O24410 Gestational diabetes mellitus in pregnancy, diet controlled: Secondary | ICD-10-CM

## 2014-12-09 NOTE — Progress Notes (Signed)
Diabetes Self-Management Education  Visit Type: First/Initial  Appt. Start Time: 0905 Appt. End Time: 1035  12/09/2014  Cassandra Duarte, identified by name and date of birth, is a 29 y.o. female with a diagnosis of Diabetes: Gestational Diabetes.   ASSESSMENT  Blood pressure 100/64, height 5\' 3"  (1.6 m), weight 159 lb 11.2 oz (72.439 kg), last menstrual period 05/23/2014. Body mass index is 28.3 kg/(m^2).      Diabetes Self-Management Education - 12/09/14 1014    Visit Information   Visit Type First/Initial   Initial Visit   Diabetes Type Gestational Diabetes   Are you currently following a meal plan? No   Are you taking your medications as prescribed? Yes   Date Diagnosed this month   Health Coping   How would you rate your overall health? Good   Psychosocial Assessment   Patient Belief/Attitude about Diabetes Motivated to manage diabetes   Self-care barriers None   Self-management support Friends;Doctor's office   Patient Concerns Nutrition/Meal planning;Monitoring;Glycemic Control   Preferred Learning Style Auditory;Visual   Learning Readiness Ready   How often do you need to have someone help you when you read instructions, pamphlets, or other written materials from your doctor or pharmacy? 1 - Never   What is the last grade level you completed in school? college   Complications   How often do you check your blood sugar? 0 times/day (not testing)  Provided Accu-Chek Aviva Connect meter and instructed on use. BG upon return demonstration was 78 mg/dL at 10:25 am - 2 1/2 hrs pp.    Have you had a dilated eye exam in the past 12 months? No   Have you had a dental exam in the past 12 months? Yes   Are you checking your feet? No   Dietary Intake   Breakfast banana, waffles   Lunch sandwich, salad, pizza   Dinner hamburger, chicken, tomatoes, broccolli, corn   Beverage(s) sodas, water   Exercise   Exercise Type Light (walking / raking leaves)   How many days per week  to you exercise? 7   How many minutes per day do you exercise? 30   Total minutes per week of exercise 210   Patient Education   Previous Diabetes Education No   Disease state  Definition of diabetes, type 1 and 2, and the diagnosis of diabetes   Nutrition management  Role of diet in the treatment of diabetes and the relationship between the three main macronutrients and blood glucose level   Physical activity and exercise  Role of exercise on diabetes management, blood pressure control and cardiac health.   Monitoring Taught/evaluated SMBG meter.;Purpose and frequency of SMBG.;Identified appropriate SMBG and/or A1C goals.   Chronic complications Relationship between chronic complications and blood glucose control   Psychosocial adjustment Identified and addressed patients feelings and concerns about diabetes   Preconception care Pregnancy and GDM  Role of pre-pregnancy blood glucose control on the development of the fetus;Reviewed with patient blood glucose goals with pregnancy   Individualized Goals (developed by patient)   Reducing Risk Improve blood sugars   Outcomes   Expected Outcomes Demonstrated interest in learning. Expect positive outcomes      Individualized Plan for Diabetes Self-Management Training:   Learning Objective:  Patient will have a greater understanding of diabetes self-management. Patient education plan is to attend individual and/or group sessions per assessed needs and concerns.   Plan:   Patient Instructions  Read booklet on Gestational Diabetes Follow Gestational Meal  Planning Guidelines Complete a 3 Day Food Record and bring to next appointment Avoid sugar sweetened drinks Don't skip meals Check blood sugars 4 x day - before breakfast and 2 hrs after every meal and record  Call MD for prescription for meter strips and lancets Strips:  Accu-Check Aviva Connect Lancets:   Accu-Chek Fastclix Bring blood sugar log to next appointment Purchase urine ketone  strips if blood sugars not controlled and check urine ketones every am:  If + increase bedtime snack to 1 protein and 2 carbohydrate servings Walk 20-30 minutes at least 5 x week if permitted by MD   Expected Outcomes:  Demonstrated interest in learning. Expect positive outcomes  Education material provided:  Gestational Booklet Gestational Meal Planning Guidelines Viewed Gestational Diabetes Video Meter - Accu-Chek Aviva Connect 3 Day Food Record Goals for a Healthy Pregnancy  If problems or questions, patient to contact team via:   Johny Drilling, [facility], [place], CDE [phone]  Future DSME appointment:  Tuesday December 24, 2014 at 11:30 am with dietitian

## 2014-12-09 NOTE — Telephone Encounter (Signed)
[facility] and when she left she came by here because sheneed a RX called in for her, she is using the ACCU Chek Aviva stripes and the ACCU Chek Fastclix for the lancets, she is going out of town Tuesday night and they only gave her 8. She uses [facility] [address].

## 2014-12-09 NOTE — Telephone Encounter (Signed)
She has some questions about her and the baby. Cassandra Duarte is not here so I'm sending to you!

## 2014-12-09 NOTE — Telephone Encounter (Signed)
Pt had questions about diabetes and pregnancy but asked lifestyles today and is worried about baby growing too big and too much amniotic fluid. Pt reassured, to follow diet as suggested. Also wanted more samples of iron but we do not have any. I suggested Iron 325mg . Po daily otc. Will need to ask pharmacist for it.

## 2014-12-09 NOTE — Patient Instructions (Signed)
Read booklet on Gestational Diabetes Follow Gestational Meal Planning Guidelines Complete a 3 Day Food Record and bring to next appointment Avoid sugar sweetened drinks Don't skip meals Check blood sugars 4 x day - before breakfast and 2 hrs after every meal and record  Call MD for prescription for meter strips and lancets Strips Accu-Check Aviva Lancets   Accu-Chek Fastclix Bring blood sugar log to next appointment Purchase urine ketone strips if blood sugars not controlled and check urine ketones every am:  If + increase bedtime snack to 1 protein and 2 carbohydrate servings Walk 20-30 minutes at least 5 x week if permitted by MD

## 2014-12-10 ENCOUNTER — Other Ambulatory Visit: Payer: Self-pay | Admitting: Obstetrics and Gynecology

## 2014-12-10 MED ORDER — ACCU-CHEK FASTCLIX LANCETS MISC: 1.0000 | Freq: Four times a day (QID) | Status: DC

## 2014-12-10 MED ORDER — GLUCOSE BLOOD VI STRP: ORAL_STRIP | Status: DC

## 2014-12-10 NOTE — Telephone Encounter (Signed)
Please let her know I sent it in today

## 2014-12-10 NOTE — Telephone Encounter (Signed)
Pt aware and has already picked them up.

## 2014-12-11 ENCOUNTER — Encounter: Payer: Medicaid Other | Admitting: Obstetrics and Gynecology

## 2014-12-17 ENCOUNTER — Ambulatory Visit (INDEPENDENT_AMBULATORY_CARE_PROVIDER_SITE_OTHER): Payer: Medicaid Other | Admitting: Obstetrics and Gynecology

## 2014-12-17 ENCOUNTER — Encounter: Payer: Self-pay | Admitting: Obstetrics and Gynecology

## 2014-12-17 VITALS — BP 118/71 | HR 93 | Wt 159.5 lb

## 2014-12-17 DIAGNOSIS — Z3493 Encounter for supervision of normal pregnancy, unspecified, third trimester: Secondary | ICD-10-CM

## 2014-12-17 LAB — POCT URINALYSIS DIPSTICK
Blood, UA: NEGATIVE
Glucose, UA: NEGATIVE
Ketones, UA: NEGATIVE
LEUKOCYTES UA: NEGATIVE
NITRITE UA: NEGATIVE
Spec Grav, UA: 1.015
UROBILINOGEN UA: 0.2
pH, UA: 6.5

## 2014-12-17 MED ORDER — RANITIDINE HCL 150 MG PO TABS: 150.0000 mg | ORAL_TABLET | Freq: Two times a day (BID) | ORAL | Status: DC

## 2014-12-17 NOTE — Patient Instructions (Signed)
Laparoscopic Tubal Ligation Laparoscopic tubal ligation is a procedure that closes the fallopian tubes at a time other than right after childbirth. When the fallopian tubes are closed, the eggs that are released from the ovaries cannot enter the uterus, and sperm cannot reach the egg. Tubal ligation is also known as getting your "tubes tied." Tubal ligation is done so you will not be able to get pregnant or have a baby. Although this procedure may be undone (reversed), it should be considered permanent and irreversible. If you want to have future pregnancies, you should not have this procedure. LET [facility] CARE PROVIDER KNOW ABOUT:  Any allergies you have.  All medicines you are taking, including vitamins, herbs, eye drops, creams, and over-the-counter medicines. This includes any use of steroids, either by mouth or in cream form.  Previous problems you or members of your family have had with the use of anesthetics.  Any blood disorders you have.  Previous surgeries you have had.  Any medical conditions you may have.  Possibility of pregnancy, if this applies.  Any past pregnancies. RISKS AND COMPLICATIONS  Infection.  Bleeding.  Injury to surrounding organs.  Side effects from anesthetics.  Failure of the procedure.  Ectopic pregnancy.  Future regret about having the procedure done. BEFORE THE PROCEDURE  Ask your health care provider about:  Changing or stopping your regular medicines. This is especially important if you are taking diabetes medicines or blood thinners.  Taking medicines such as aspirin and ibuprofen. These medicines can thin your blood. Do not take these medicines before your procedure if your health care provider instructs you not to.  Follow instructions from your health care provider about eating and drinking restrictions.  Plan to have someone take you home after the procedure.  If you go home right after the procedure, plan to have someone  with you for 24 hours. PROCEDURE  You will be given one or more of the following:  A medicine that helps you relax (sedative).  A medicine that numbs the area (local anesthetic).  A medicine that makes you fall asleep (general anesthetic).  A medicine that is injected into an area of your body that numbs everything below the injection site (regional anesthetic).  If you have been given general anesthetic, a tube will be put down your throat to help you breathe.  Two small cuts (incisions) will be made in the lower abdominal area and near the belly button.  Your bladder may be emptied with a small tube (catheter).  Your abdomen will be inflated with a safe gas (carbon dioxide). This will help to give the surgeon room to operate and visualize, and it will help the surgeon to avoid other organs.  A thin, lighted tube (laparoscope) with a camera attached will be inserted into your abdomen through one of the incisions near the belly button. Other small instruments will be inserted through the other abdominal incision.  The fallopian tubes will be tied off or burned (cauterized), or they will be blocked with a clip, ring, or clamp. In many cases, a small portion in the center of each fallopian tube will also be removed.  After the fallopian tubes are blocked, the gas will be released from the abdomen.  The incisions will be closed with stitches (sutures).  A bandage (dressing) will be placed over the incisions. The procedure may vary among health care providers and hospitals. AFTER THE PROCEDURE  Your blood pressure, heart rate, breathing rate, and blood oxygen level  will be monitored often until the medicines you were given have worn off.  You will be given pain medicine as needed.  If you had general anesthetic, you may have some mild discomfort in your throat. This is from the breathing tube that was placed in your throat while you were sleeping.  You may experience discomfort in  the shoulder area from some trapped air between your liver and your diaphragm. This sensation is normal, and it will slowly go away on its own.  You will have some mild abdominal discomfort for 3--7 days.   This information is not intended to replace advice given to you by your health care provider. Make sure you discuss any questions you have with your health care provider.   Document Released: 04/12/2000 Document Revised: 05/21/2014 Document Reviewed: 04/17/2011 Elsevier Interactive Patient Education [employer].
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
## 2014-12-17 NOTE — Progress Notes (Signed)
ROB- discussed PPTL and medicaid consent signed. Having heartburn most nights- RX sent in for zantzc bid prn. FBS all <96 ecxept one. And PP all < 130 except one.  Will do growth scan next visit.

## 2014-12-18 ENCOUNTER — Encounter: Payer: Medicaid Other | Admitting: Certified Nurse Midwife

## 2014-12-24 ENCOUNTER — Encounter: Payer: Medicaid Other | Attending: Obstetrics and Gynecology | Admitting: Dietician

## 2014-12-24 VITALS — BP 100/58 | Ht 63.0 in | Wt 159.7 lb

## 2014-12-24 DIAGNOSIS — O24419 Gestational diabetes mellitus in pregnancy, unspecified control: Secondary | ICD-10-CM | POA: Diagnosis not present

## 2014-12-24 DIAGNOSIS — O24410 Gestational diabetes mellitus in pregnancy, diet controlled: Secondary | ICD-10-CM

## 2014-12-24 NOTE — Progress Notes (Signed)
   Patient's BG record indicates BGs generally within goal ranges; patient reports some higher fasting BGs when she skips evening snack.   She also had 2 post-meal readings of 158, which improved when she walked after eating.  Patient's food diary indicates some low-carb meals due to some early satiety and limiting some grains due to daughter's celiac disease.   She reports being a picky eater so snack choices are mostly crackers with cheese or yogurt.   Provided 1900kcal meal plan, and wrote individualized menus based on patient's food preferences.  Instructed patient on food safety, including avoidance of Listeriosis, and limiting mercury from fish.  Discussed importance of maintaining healthy lifestyle habits to reduce risk of Type 2 DM as well as Gestational DM with any future pregnancies.  Advised patient to use any remaining testing supplies to test some BGs after delivery, and to have BG tested ideally annually, as well as prior to attempting future pregnancies.

## 2014-12-27 ENCOUNTER — Ambulatory Visit (INDEPENDENT_AMBULATORY_CARE_PROVIDER_SITE_OTHER): Payer: Medicaid Other

## 2014-12-27 DIAGNOSIS — Z3493 Encounter for supervision of normal pregnancy, unspecified, third trimester: Secondary | ICD-10-CM | POA: Diagnosis not present

## 2014-12-30 ENCOUNTER — Observation Stay
Admission: EM | Admit: 2014-12-30 | Discharge: 2014-12-30 | Disposition: A | Discharge status: Home / Self Care | Payer: Medicaid Other | Attending: Obstetrics and Gynecology | Admitting: Obstetrics and Gynecology

## 2014-12-30 DIAGNOSIS — Z3A31 31 weeks gestation of pregnancy: Secondary | ICD-10-CM | POA: Diagnosis not present

## 2014-12-30 DIAGNOSIS — O24919 Unspecified diabetes mellitus in pregnancy, unspecified trimester: Secondary | ICD-10-CM

## 2014-12-30 DIAGNOSIS — O26893 Other specified pregnancy related conditions, third trimester: Principal | ICD-10-CM | POA: Insufficient documentation

## 2014-12-30 DIAGNOSIS — O24419 Gestational diabetes mellitus in pregnancy, unspecified control: Secondary | ICD-10-CM | POA: Diagnosis not present

## 2014-12-30 DIAGNOSIS — R109 Unspecified abdominal pain: Secondary | ICD-10-CM | POA: Diagnosis present

## 2014-12-30 LAB — URINALYSIS COMPLETE WITH MICROSCOPIC (ARMC ONLY)
BACTERIA UA: NONE SEEN
Bilirubin Urine: NEGATIVE
GLUCOSE, UA: NEGATIVE mg/dL
HGB URINE DIPSTICK: NEGATIVE
Ketones, ur: NEGATIVE mg/dL
LEUKOCYTES UA: NEGATIVE
NITRITE: NEGATIVE
PH: 6 (ref 5.0–8.0)
Protein, ur: NEGATIVE mg/dL
SPECIFIC GRAVITY, URINE: 1.02 (ref 1.005–1.030)

## 2014-12-30 NOTE — Discharge Summary (Signed)
Discharge instructions reviewed with patient including follow up appointments, signs of preterm labor, and when to seek medical attention. All questions answered. Patient discharged home in stable condition with steady gait.

## 2014-12-31 ENCOUNTER — Encounter: Payer: Medicaid Other | Admitting: Certified Nurse Midwife

## 2014-12-31 ENCOUNTER — Other Ambulatory Visit: Payer: Medicaid Other

## 2015-01-01 ENCOUNTER — Ambulatory Visit (INDEPENDENT_AMBULATORY_CARE_PROVIDER_SITE_OTHER): Payer: Medicaid Other | Admitting: Certified Nurse Midwife

## 2015-01-01 ENCOUNTER — Encounter: Payer: Self-pay | Admitting: Certified Nurse Midwife

## 2015-01-01 VITALS — BP 97/64 | HR 93 | Wt 161.4 lb

## 2015-01-01 DIAGNOSIS — Z369 Encounter for antenatal screening, unspecified: Secondary | ICD-10-CM

## 2015-01-01 DIAGNOSIS — O24419 Gestational diabetes mellitus in pregnancy, unspecified control: Secondary | ICD-10-CM

## 2015-01-01 DIAGNOSIS — Z331 Pregnant state, incidental: Secondary | ICD-10-CM

## 2015-01-01 DIAGNOSIS — Z1389 Encounter for screening for other disorder: Secondary | ICD-10-CM

## 2015-01-01 DIAGNOSIS — Z36 Encounter for antenatal screening of mother: Secondary | ICD-10-CM

## 2015-01-01 DIAGNOSIS — Z3490 Encounter for supervision of normal pregnancy, unspecified, unspecified trimester: Secondary | ICD-10-CM

## 2015-01-01 DIAGNOSIS — O24919 Unspecified diabetes mellitus in pregnancy, unspecified trimester: Secondary | ICD-10-CM

## 2015-01-01 LAB — POCT URINALYSIS DIPSTICK
Bilirubin, UA: NEGATIVE
Blood, UA: NEGATIVE
Glucose, UA: NEGATIVE
Ketones, UA: NEGATIVE
LEUKOCYTES UA: NEGATIVE
Nitrite, UA: NEGATIVE
PH UA: 6
PROTEIN UA: NEGATIVE
Spec Grav, UA: 1.02
UROBILINOGEN UA: NEGATIVE

## 2015-01-01 NOTE — Progress Notes (Signed)
ROB- c/o cough-lungs clear, no temp.  Advised Robitussin.  Nutrtionist visit last week.  Stated BS are ok.  Pt reports FBS 83-98 with one 115.  PP BS around 113.  Patient denies any signs of PTL.  Pt seeing chiropractor PRN ligament pain.

## 2015-01-01 NOTE — MAU Provider Note (Signed)
L&D OB Triage Note  SUBJECTIVE Roshawn Hanni is a 29 y.o. [id number] female at [redacted]w[redacted]d, [place] Estimated Date of Delivery: 02/27/15 who presented to triage with complaints of Abdominal cramping.    OBJECTIVE Nursing Evaluation: BP 116/71 mmHg  Pulse 101  Temp(Src) 98.9 F (37.2 C) (Oral)  Resp 18  LMP 05/23/2014 (LMP Unknown) no significant findings for Preterm labor.  NST was performed and has been reviewed by me.  NST INTERPRETATION: Indications: Abdominal cramping;; Gestational diabetes  Mode: External Baseline Rate (A): 130 bpm Variability: Moderate Accelerations: 15 x 15 Decelerations: None Nonstress Test Interpretation: Reactive Overall Impression: Reassuring for gestational age Contraction Frequency (min): occasional  ASSESSMENT Impression:  1. Pregnancy:  [id number] at [redacted]w[redacted]d , EDD Estimated Date of Delivery: 02/27/15 2.  Reactive NST 3.  Gestational diabetes  PLAN 1. Reassurance given 2. Discharge home with bleeding/labor precautions.  3. Continue routine prenatal care.   Brayton Mars, MD

## 2015-01-01 NOTE — Progress Notes (Signed)
Pt was in L&D on 12/30/2014 for contractions but was told they were braxton hicks and finger tip dilated.

## 2015-01-15 ENCOUNTER — Ambulatory Visit (INDEPENDENT_AMBULATORY_CARE_PROVIDER_SITE_OTHER): Payer: Medicaid Other | Admitting: Obstetrics and Gynecology

## 2015-01-15 ENCOUNTER — Encounter: Payer: Self-pay | Admitting: Obstetrics and Gynecology

## 2015-01-15 VITALS — BP 124/71 | HR 99 | Wt 162.9 lb

## 2015-01-15 DIAGNOSIS — Z3493 Encounter for supervision of normal pregnancy, unspecified, third trimester: Secondary | ICD-10-CM

## 2015-01-15 LAB — POCT URINALYSIS DIPSTICK
Bilirubin, UA: NEGATIVE
Glucose, UA: NEGATIVE
Ketones, UA: NEGATIVE
Leukocytes, UA: NEGATIVE
NITRITE UA: NEGATIVE
PH UA: 6
RBC UA: NEGATIVE
Spec Grav, UA: 1.015
UROBILINOGEN UA: 0.2

## 2015-01-15 NOTE — Progress Notes (Signed)
ROB- doing well, FBS all < 95 and PP all <110, ok to check FBS & 1 PP daily from here on out, will schedule growth scan for next visit.

## 2015-01-15 NOTE — Progress Notes (Signed)
ROB- pt is having some pelvic pressure, low back pain 

## 2015-01-19 NOTE — L&D Delivery Note (Signed)
Delivery Note At  a viable but floppy female infant was delivered via  (PresentationROA with tight shoulders ;  ).  APGAR:8 ,9 ; weight  .  8#12oz Placenta status:elivered intact with 3 vessel Cord:  with the following complications:[place] x1 reduced easily on perineum, tight shoulders reduced with MacRoberts maneuver .  Cord pH: NA  Anesthesia:  epidural Episiotomy:  none Lacerations:  none Suture Repair: NA Est. Blood Loss (mL):  150  Mom to postpartum. NPO after midnight for PPTL Baby to Nursery for observation due to grunting.  Melody N Shambley,CNM 02/20/2015, 11:02 PM  Date of Initial H&P: 02/20/2015  History reviewed, patient examined, no change in status, stable for surgery.

## 2015-01-28 ENCOUNTER — Ambulatory Visit (INDEPENDENT_AMBULATORY_CARE_PROVIDER_SITE_OTHER): Payer: Medicaid Other

## 2015-01-28 ENCOUNTER — Observation Stay
Admission: EM | Admit: 2015-01-28 | Discharge: 2015-01-28 | Disposition: A | Discharge status: Home / Self Care | Payer: Medicaid Other | Attending: Obstetrics and Gynecology | Admitting: Obstetrics and Gynecology

## 2015-01-28 ENCOUNTER — Encounter: Payer: Self-pay | Admitting: *Deleted

## 2015-01-28 DIAGNOSIS — Z3493 Encounter for supervision of normal pregnancy, unspecified, third trimester: Secondary | ICD-10-CM | POA: Diagnosis not present

## 2015-01-28 DIAGNOSIS — O99810 Abnormal glucose complicating pregnancy: Secondary | ICD-10-CM

## 2015-01-28 DIAGNOSIS — O24419 Gestational diabetes mellitus in pregnancy, unspecified control: Secondary | ICD-10-CM | POA: Diagnosis not present

## 2015-01-28 DIAGNOSIS — Z3A36 36 weeks gestation of pregnancy: Secondary | ICD-10-CM | POA: Diagnosis not present

## 2015-01-28 NOTE — Discharge Summary (Signed)
Discharge instructions reviewed with patient including follow up appointments, signs of preterm labor and when to seek medical attention. All questions answered. Patient discharge home in stable condition, ambulatory with steady gait, no signs of distress noted.

## 2015-01-29 ENCOUNTER — Encounter: Payer: Medicaid Other | Admitting: Obstetrics and Gynecology

## 2015-01-29 ENCOUNTER — Other Ambulatory Visit: Payer: Medicaid Other

## 2015-01-30 ENCOUNTER — Ambulatory Visit (INDEPENDENT_AMBULATORY_CARE_PROVIDER_SITE_OTHER): Payer: Medicaid Other | Admitting: Obstetrics and Gynecology

## 2015-01-30 ENCOUNTER — Encounter: Payer: Self-pay | Admitting: Obstetrics and Gynecology

## 2015-01-30 VITALS — BP 116/72 | HR 101 | Wt 165.0 lb

## 2015-01-30 DIAGNOSIS — Z3685 Encounter for antenatal screening for Streptococcus B: Secondary | ICD-10-CM

## 2015-01-30 DIAGNOSIS — Z36 Encounter for antenatal screening of mother: Secondary | ICD-10-CM

## 2015-01-30 DIAGNOSIS — Z331 Pregnant state, incidental: Secondary | ICD-10-CM

## 2015-01-30 DIAGNOSIS — Z113 Encounter for screening for infections with a predominantly sexual mode of transmission: Secondary | ICD-10-CM

## 2015-01-30 LAB — POCT URINALYSIS DIPSTICK
BILIRUBIN UA: NEGATIVE
GLUCOSE UA: NEGATIVE
KETONES UA: NEGATIVE
Nitrite, UA: NEGATIVE
Spec Grav, UA: 1.01
Urobilinogen, UA: 0.2
pH, UA: 6

## 2015-01-30 NOTE — Progress Notes (Signed)
ROB- cultures obtained, labor precautions reiterated.

## 2015-01-30 NOTE — Progress Notes (Signed)
ROB-cultures obtained today, pt is having lots pelvic pressure and cramping, low back pain

## 2015-02-02 LAB — STREP GP B NAA: STREP GROUP B AG: NEGATIVE

## 2015-02-02 NOTE — Final Progress Note (Signed)
L&D OB Triage Note  SUBJECTIVE Cassandra Duarte is a 30 y.o. [id number] female at [redacted]w[redacted]d, EDD Estimated Date of Delivery: 02/27/15 who presented to triage with complaints of Contractions.    OBJECTIVE Nursing Evaluation: BP 115/77 mmHg  Pulse 114  Temp(Src) 98 F (36.7 C) (Oral)  Resp 20  Ht 5\' 3"  (1.6 m)  Wt 161 lb (73.029 kg)  BMI 28.53 kg/m2  LMP 05/23/2014 (LMP Unknown) no significant findings For  Labor  NST was performed and has been reviewed by me.  NST INTERPRETATION: Indications: Contractions; GDM A2  Mode: External Baseline Rate (A): 150 bpm Variability: Moderate Accelerations: 15 x 15 Decelerations: None Nonstress Test Interpretation: Reactive Overall Impression: Reassuring for gestational age Contraction Frequency (min): occasional  ASSESSMENT Impression:  1. Pregnancy:  [id number] at [redacted]w[redacted]d , EDD Estimated Date of Delivery: 02/27/15 2.  Reactive NST. 3.  GDM A2  PLAN 1. Reassurance given 2. Discharge home with bleeding/labor precautions.  3. Continue routine prenatal care And antepartum testing   Brayton Mars, MD

## 2015-02-04 ENCOUNTER — Telehealth: Payer: Self-pay | Admitting: Obstetrics and Gynecology

## 2015-02-04 ENCOUNTER — Other Ambulatory Visit: Payer: Self-pay | Admitting: Obstetrics and Gynecology

## 2015-02-04 LAB — GC/CHLAMYDIA PROBE AMP
Chlamydia trachomatis, NAA: NEGATIVE
NEISSERIA GONORRHOEAE BY PCR: NEGATIVE

## 2015-02-04 NOTE — Telephone Encounter (Signed)
Advised pt she can take ibuprofen 400mg  and drink some caffeine and let us know,she voiced understanding

## 2015-02-04 NOTE — Telephone Encounter (Signed)
The pt is not feeling weill and has had a head ache since her last visit, the teylenol is not working. She is wondering if the headache is from the diabetes? Will you please call her and talk to her.

## 2015-02-06 ENCOUNTER — Encounter: Payer: Self-pay | Admitting: Obstetrics and Gynecology

## 2015-02-06 ENCOUNTER — Ambulatory Visit (INDEPENDENT_AMBULATORY_CARE_PROVIDER_SITE_OTHER): Payer: Medicaid Other | Admitting: Obstetrics and Gynecology

## 2015-02-06 ENCOUNTER — Encounter: Payer: Self-pay | Admitting: *Deleted

## 2015-02-06 ENCOUNTER — Inpatient Hospital Stay
Admission: EM | Admit: 2015-02-06 | Discharge: 2015-02-06 | Disposition: A | Discharge status: Home / Self Care | Payer: Medicaid Other | Attending: Obstetrics and Gynecology | Admitting: Obstetrics and Gynecology

## 2015-02-06 VITALS — BP 118/79 | HR 98 | Wt 167.5 lb

## 2015-02-06 DIAGNOSIS — Z3A37 37 weeks gestation of pregnancy: Secondary | ICD-10-CM | POA: Diagnosis not present

## 2015-02-06 DIAGNOSIS — O24419 Gestational diabetes mellitus in pregnancy, unspecified control: Secondary | ICD-10-CM | POA: Insufficient documentation

## 2015-02-06 DIAGNOSIS — Z331 Pregnant state, incidental: Secondary | ICD-10-CM

## 2015-02-06 DIAGNOSIS — O4703 False labor before 37 completed weeks of gestation, third trimester: Secondary | ICD-10-CM

## 2015-02-06 LAB — POCT URINALYSIS DIPSTICK
BILIRUBIN UA: NEGATIVE
Blood, UA: NEGATIVE
GLUCOSE UA: NEGATIVE
KETONES UA: NEGATIVE
Leukocytes, UA: NEGATIVE
Nitrite, UA: NEGATIVE
SPEC GRAV UA: 1.01
Urobilinogen, UA: 0.2
pH, UA: 6

## 2015-02-06 NOTE — ED Notes (Signed)
Pt presents with frequent contractions since 1am. Gravida 3. Gestational diabetic. Decrease in fetal movement today.

## 2015-02-06 NOTE — OB Triage Provider Note (Signed)
L&D OB Triage Note  Cassandra Duarte is a 30 y.o. [id number] female at [redacted]w[redacted]d, EDD Estimated Date of Delivery: 02/27/15 who presented to triage for complaints of contractions since 1am, getting closer and stronger.  She was evaluated by the nurses with no significant findings/findings significant for ROM, probable early labor. Vital signs stable. An NST was performed and has been reviewed by MD. She was treated with po fluids.   NST INTERPRETATION: Indications: rule out uterine contractions  Mode: External Baseline Rate (A): 135 bpm Variability: Moderate Accelerations: 15 x 15 Decelerations: None     Contraction Frequency (min): 5-7 SVE: 3/th/-2 posterior per myself Impression: reactive   Plan:Ambulate as tolerated, recheck cervix as indicated, intermittent fetal monitoring, will admit if dilates >4cm.    Melody Rockney Ghee, CNM

## 2015-02-06 NOTE — OB Triage Note (Signed)
Recvd to OBS 3 with c/o of contractions since 0100 this AM.  Changed to gown and to bed.  EFM applied.  Oriented to room and plan of care discussed.  Verbalized understanding and agrees with plan of care.

## 2015-02-06 NOTE — Discharge Instructions (Signed)
If you are continuing to contract this afternoon please keep you appointment at 4:30PM.  If you are not contracting make an appointment for early in the week.

## 2015-02-06 NOTE — Progress Notes (Signed)
ROB-lots of pelvic pressure, contractions

## 2015-02-06 NOTE — Progress Notes (Signed)
ROB- doing well, tylenol PM as needed.

## 2015-02-13 ENCOUNTER — Encounter: Payer: Self-pay | Admitting: Obstetrics and Gynecology

## 2015-02-13 ENCOUNTER — Ambulatory Visit (INDEPENDENT_AMBULATORY_CARE_PROVIDER_SITE_OTHER): Payer: Medicaid Other | Admitting: Obstetrics and Gynecology

## 2015-02-13 VITALS — BP 114/92 | HR 98 | Wt 166.2 lb

## 2015-02-13 DIAGNOSIS — Z331 Pregnant state, incidental: Secondary | ICD-10-CM

## 2015-02-13 LAB — POCT URINALYSIS DIPSTICK
Blood, UA: NEGATIVE
Glucose, UA: NEGATIVE
KETONES UA: NEGATIVE
Leukocytes, UA: NEGATIVE
Nitrite, UA: NEGATIVE
SPEC GRAV UA: 1.015
Urobilinogen, UA: 0.2
pH, UA: 6

## 2015-02-13 NOTE — Progress Notes (Signed)
ROB- labor precautions, for IOL on 2/2

## 2015-02-13 NOTE — Progress Notes (Signed)
ROB-pts c/o headache, seeing spots, her BP elevated today Pelvic pressure, back pain, some diarrhea

## 2015-02-19 ENCOUNTER — Telehealth: Payer: Self-pay | Admitting: Obstetrics and Gynecology

## 2015-02-19 ENCOUNTER — Observation Stay
Admission: EM | Admit: 2015-02-19 | Discharge: 2015-02-19 | Disposition: A | Discharge status: Home / Self Care | Payer: Medicaid Other | Source: Home / Self Care | Attending: Obstetrics and Gynecology | Admitting: Obstetrics and Gynecology

## 2015-02-19 ENCOUNTER — Encounter: Payer: Self-pay | Admitting: *Deleted

## 2015-02-19 DIAGNOSIS — Z3A39 39 weeks gestation of pregnancy: Secondary | ICD-10-CM

## 2015-02-19 DIAGNOSIS — Z87891 Personal history of nicotine dependence: Secondary | ICD-10-CM

## 2015-02-19 DIAGNOSIS — Z888 Allergy status to other drugs, medicaments and biological substances status: Secondary | ICD-10-CM

## 2015-02-19 DIAGNOSIS — O4703 False labor before 37 completed weeks of gestation, third trimester: Secondary | ICD-10-CM

## 2015-02-19 DIAGNOSIS — Z302 Encounter for sterilization: Secondary | ICD-10-CM

## 2015-02-19 DIAGNOSIS — O24429 Gestational diabetes mellitus in childbirth, unspecified control: Principal | ICD-10-CM | POA: Diagnosis present

## 2015-02-19 DIAGNOSIS — Z886 Allergy status to analgesic agent status: Secondary | ICD-10-CM

## 2015-02-19 DIAGNOSIS — O24410 Gestational diabetes mellitus in pregnancy, diet controlled: Secondary | ICD-10-CM | POA: Diagnosis present

## 2015-02-19 DIAGNOSIS — Z833 Family history of diabetes mellitus: Secondary | ICD-10-CM

## 2015-02-19 MED ORDER — ACETAMINOPHEN 325 MG PO TABS: 650.0000 mg | ORAL_TABLET | ORAL | Status: DC | PRN

## 2015-02-19 MED ORDER — CALCIUM CARBONATE ANTACID 500 MG PO CHEW: 2.0000 | CHEWABLE_TABLET | ORAL | Status: DC | PRN

## 2015-02-19 NOTE — Telephone Encounter (Signed)
Her husband work won't let him off tomorrow so she was wanting to know if she can be induced tonight instead of tomorrow, call her and she will explain.

## 2015-02-19 NOTE — Telephone Encounter (Signed)
Mel how would you like to handle this???

## 2015-02-19 NOTE — OB Triage Note (Signed)
Recvd to OBS 1 with c/o contractions and possible ROM.  Changed to gown and to bed.  EFM applied.  Oriented to room and plan of care discussed.  Verbalized understanding and agrees with plan of care.

## 2015-02-20 ENCOUNTER — Inpatient Hospital Stay
Admission: EM | Admit: 2015-02-20 | Discharge: 2015-02-22 | DRG: 767 | Disposition: A | Discharge status: Home / Self Care | Payer: Medicaid Other | Attending: Obstetrics and Gynecology | Admitting: Obstetrics and Gynecology

## 2015-02-20 ENCOUNTER — Encounter: Payer: Self-pay | Admitting: *Deleted

## 2015-02-20 ENCOUNTER — Inpatient Hospital Stay: Payer: Medicaid Other | Admitting: Anesthesiology

## 2015-02-20 DIAGNOSIS — Z302 Encounter for sterilization: Secondary | ICD-10-CM | POA: Diagnosis not present

## 2015-02-20 DIAGNOSIS — Z3483 Encounter for supervision of other normal pregnancy, third trimester: Secondary | ICD-10-CM | POA: Diagnosis present

## 2015-02-20 DIAGNOSIS — O24410 Gestational diabetes mellitus in pregnancy, diet controlled: Secondary | ICD-10-CM | POA: Diagnosis present

## 2015-02-20 DIAGNOSIS — Z833 Family history of diabetes mellitus: Secondary | ICD-10-CM | POA: Diagnosis not present

## 2015-02-20 DIAGNOSIS — Z888 Allergy status to other drugs, medicaments and biological substances status: Secondary | ICD-10-CM | POA: Diagnosis not present

## 2015-02-20 DIAGNOSIS — O24419 Gestational diabetes mellitus in pregnancy, unspecified control: Secondary | ICD-10-CM | POA: Diagnosis present

## 2015-02-20 DIAGNOSIS — Z3493 Encounter for supervision of normal pregnancy, unspecified, third trimester: Secondary | ICD-10-CM | POA: Diagnosis not present

## 2015-02-20 DIAGNOSIS — Z3A39 39 weeks gestation of pregnancy: Secondary | ICD-10-CM | POA: Diagnosis not present

## 2015-02-20 DIAGNOSIS — Z87891 Personal history of nicotine dependence: Secondary | ICD-10-CM | POA: Diagnosis not present

## 2015-02-20 DIAGNOSIS — O24429 Gestational diabetes mellitus in childbirth, unspecified control: Secondary | ICD-10-CM | POA: Diagnosis present

## 2015-02-20 DIAGNOSIS — Z886 Allergy status to analgesic agent status: Secondary | ICD-10-CM | POA: Diagnosis not present

## 2015-02-20 LAB — TYPE AND SCREEN
ABO/RH(D): A POS
ANTIBODY SCREEN: NEGATIVE

## 2015-02-20 LAB — CBC
HCT: 35.5 % (ref 35.0–47.0)
Hemoglobin: 12.2 g/dL (ref 12.0–16.0)
MCH: 30.2 pg (ref 26.0–34.0)
MCHC: 34.4 g/dL (ref 32.0–36.0)
MCV: 87.8 fL (ref 80.0–100.0)
PLATELETS: 183 10*3/uL (ref 150–440)
RBC: 4.04 MIL/uL (ref 3.80–5.20)
RDW: 13.8 % (ref 11.5–14.5)
WBC: 10.3 10*3/uL (ref 3.6–11.0)

## 2015-02-20 LAB — ABO/RH: ABO/RH(D): A POS

## 2015-02-20 LAB — GLUCOSE, CAPILLARY
GLUCOSE-CAPILLARY: 90 mg/dL (ref 65–99)
GLUCOSE-CAPILLARY: 84 mg/dL (ref 65–99)
Glucose-Capillary: 83 mg/dL (ref 65–99)

## 2015-02-20 MED ORDER — LACTATED RINGERS IV SOLN
500.0000 mL | INTRAVENOUS | Status: DC | PRN
  Administered 2015-02-20: 500 mL via INTRAVENOUS

## 2015-02-20 MED ORDER — TERBUTALINE SULFATE 1 MG/ML IJ SOLN: 0.2500 mg | Freq: Once | INTRAMUSCULAR | Status: DC | PRN

## 2015-02-20 MED ORDER — NALBUPHINE HCL 10 MG/ML IJ SOLN: 5.0000 mg | Freq: Once | INTRAMUSCULAR | Status: DC | PRN

## 2015-02-20 MED ORDER — BUPIVACAINE HCL (PF) 0.25 % IJ SOLN
INTRAMUSCULAR | Status: DC | PRN
  Administered 2015-02-20: 5 mL via EPIDURAL

## 2015-02-20 MED ORDER — FENTANYL 2.5 MCG/ML W/ROPIVACAINE 0.2% IN NS 100 ML EPIDURAL INFUSION (ARMC-ANES)
EPIDURAL | Status: AC
  Administered 2015-02-20: 10 mL/h via EPIDURAL
  Filled 2015-02-20: qty 100

## 2015-02-20 MED ORDER — FENTANYL 2.5 MCG/ML W/ROPIVACAINE 0.2% IN NS 100 ML EPIDURAL INFUSION (ARMC-ANES)
10.0000 mL/h | EPIDURAL | Status: DC
  Administered 2015-02-20: 10 mL/h via EPIDURAL

## 2015-02-20 MED ORDER — ACETAMINOPHEN 325 MG PO TABS: 650.0000 mg | ORAL_TABLET | ORAL | Status: DC | PRN

## 2015-02-20 MED ORDER — NALOXONE HCL 2 MG/2ML IJ SOSY
1.0000 ug/kg/h | PREFILLED_SYRINGE | INTRAVENOUS | Status: DC | PRN
  Filled 2015-02-20: qty 2

## 2015-02-20 MED ORDER — NALOXONE HCL 0.4 MG/ML IJ SOLN: 0.4000 mg | INTRAMUSCULAR | Status: DC | PRN

## 2015-02-20 MED ORDER — OXYTOCIN 10 UNIT/ML IJ SOLN
INTRAMUSCULAR | Status: AC
  Filled 2015-02-20: qty 2

## 2015-02-20 MED ORDER — MISOPROSTOL 200 MCG PO TABS
ORAL_TABLET | ORAL | Status: AC
  Filled 2015-02-20: qty 4

## 2015-02-20 MED ORDER — NALBUPHINE HCL 10 MG/ML IJ SOLN: 5.0000 mg | INTRAMUSCULAR | Status: DC | PRN

## 2015-02-20 MED ORDER — SODIUM CHLORIDE 0.9% FLUSH
3.0000 mL | INTRAVENOUS | Status: DC | PRN
Start: 2015-02-20 — End: 2015-02-21

## 2015-02-20 MED ORDER — AMMONIA AROMATIC IN INHA
RESPIRATORY_TRACT | Status: AC
  Filled 2015-02-20: qty 10

## 2015-02-20 MED ORDER — LIDOCAINE HCL (PF) 1 % IJ SOLN: 30.0000 mL | INTRAMUSCULAR | Status: DC | PRN

## 2015-02-20 MED ORDER — OXYTOCIN BOLUS FROM INFUSION
500.0000 mL | INTRAVENOUS | Status: DC
  Administered 2015-02-20: 500 mL via INTRAVENOUS

## 2015-02-20 MED ORDER — LIDOCAINE HCL (PF) 1 % IJ SOLN
INTRAMUSCULAR | Status: AC
  Administered 2015-02-20: 1 mL via INTRADERMAL
  Filled 2015-02-20: qty 30

## 2015-02-20 MED ORDER — ONDANSETRON HCL 4 MG/2ML IJ SOLN
4.0000 mg | Freq: Three times a day (TID) | INTRAMUSCULAR | Status: DC | PRN
  Administered 2015-02-21: 4 mg via INTRAVENOUS
  Filled 2015-02-20: qty 2

## 2015-02-20 MED ORDER — DIPHENHYDRAMINE HCL 25 MG PO CAPS
25.0000 mg | ORAL_CAPSULE | ORAL | Status: DC | PRN
Start: 2015-02-20 — End: 2015-02-21

## 2015-02-20 MED ORDER — DIPHENHYDRAMINE HCL 50 MG/ML IJ SOLN: 12.5000 mg | INTRAMUSCULAR | Status: DC | PRN

## 2015-02-20 MED ORDER — ONDANSETRON HCL 4 MG/2ML IJ SOLN
4.0000 mg | Freq: Four times a day (QID) | INTRAMUSCULAR | Status: DC | PRN
  Administered 2015-02-20: 4 mg via INTRAVENOUS
  Filled 2015-02-20: qty 2

## 2015-02-20 MED ORDER — OXYTOCIN 40 UNITS IN LACTATED RINGERS INFUSION - SIMPLE MED
2.5000 [IU]/h | INTRAVENOUS | Status: DC
  Filled 2015-02-20: qty 1000

## 2015-02-20 MED ORDER — LIDOCAINE-EPINEPHRINE (PF) 1.5 %-1:200000 IJ SOLN
INTRAMUSCULAR | Status: DC | PRN
  Administered 2015-02-20: 3 mL via EPIDURAL

## 2015-02-20 MED ORDER — OXYTOCIN 40 UNITS IN LACTATED RINGERS INFUSION - SIMPLE MED
1.0000 m[IU]/min | INTRAVENOUS | Status: DC
  Administered 2015-02-20: 1 m[IU]/min via INTRAVENOUS

## 2015-02-20 MED ORDER — LACTATED RINGERS IV SOLN
INTRAVENOUS | Status: DC
  Administered 2015-02-20 – 2015-02-21 (×4): via INTRAVENOUS

## 2015-02-20 MED ORDER — CITRIC ACID-SODIUM CITRATE 334-500 MG/5ML PO SOLN: 30.0000 mL | ORAL | Status: DC | PRN

## 2015-02-20 MED ORDER — FENTANYL CITRATE (PF) 100 MCG/2ML IJ SOLN
50.0000 ug | INTRAMUSCULAR | Status: DC | PRN
  Administered 2015-02-20 (×2): 100 ug via INTRAVENOUS
  Filled 2015-02-20 (×3): qty 2

## 2015-02-20 NOTE — H&P (Signed)
Obstetric History and Physical  Cassandra Duarte is a 30 y.o. [phone] with IUP at [redacted]w[redacted]d presenting with GDM for IOL. Patient states she has been having  none contractions, none vaginal bleeding, intact membranes, with active fetal movement.    Prenatal Course [facility] Care: [facility]  Pregnancy complications or risks:GDM  Prenatal labs and studies: ABO, Rh: A/Positive/-- (06/07 1505) Antibody: Negative (06/07 1505) Rubella: 1.32 (06/07 1505) RPR: Non Reactive (06/07 1505)  HBsAg: Negative (06/07 1505)  HIV: Non Reactive (06/07 1505)  [id number] (01/12 0330) 1 hr Glucola  elevated Genetic screening normal Anatomy US normal  Past Medical History  Diagnosis Date  . Vaginal Pap smear, abnormal 2012    REPEAT AND WAS NORMAL  . Gestational diabetes 11/19/2014    Past Surgical History  Procedure Laterality Date  . Dilation and curettage of uterus  2014    WS    OB History  Gravida Para Term Preterm AB SAB TAB Ectopic Multiple Living  8 2 2  5 5    2     # Outcome Date GA Lbr Len/2nd Weight Sex Delivery Anes PTL Lv  8 Current           7 SAB 11/2013          6 SAB 2015          5 SAB 2014          4 SAB 2013          3 Term 02/07/08 [redacted]w[redacted]d  3.175 kg (7 lb) F Vag-Spont   Y     Complications: Preterm contractions, second trimester  2 SAB 2007          1 Term 07/21/04 [redacted]w[redacted]d  2.892 kg (6 lb 6 oz) F Vag-Spont   Y     Complications: Preterm contractions, second trimester    Obstetric Comments  H/O PTL both pregnanacies starting around 28 weeks requiring bedrest and pelvic rest - did carry to 37 and 38 weeks respectfully    Social History   Social History  . Marital Status: Single    Spouse Name: N/A  . Number of Children: N/A  . Years of Education: N/A   Social History Main Topics  . Smoking status: Former Smoker -- 0.50 packs/day for 4 years    Types: Cigarettes    Quit date: 04/02/2014  . Smokeless tobacco: Never Used  . Alcohol Use: No  . Drug Use: No  . Sexual  Activity: Yes    Birth Control/ Protection: None   Other Topics Concern  . Not on file   Social History Narrative    Family History  Problem Relation Age of Onset  . Breast cancer Paternal Aunt   . Diabetes Paternal Aunt   . Diabetes Maternal Grandfather   . Colon cancer Neg Hx   . Ovarian cancer Neg Hx   . Heart disease Neg Hx   . COPD Maternal Grandmother     Prescriptions prior to admission  Medication Sig Dispense Refill Last Dose  . ACCU-CHEK FASTCLIX LANCETS MISC 1 each by Percutaneous route 4 (four) times daily. 100 each 12 Taking  . glucose blood (ACCU-CHEK AVIVA) test strip Use as instructed 100 each 12 Taking  . Iron TABS Take 1 tablet by mouth daily.   Taking  . Prenatal Vit-Fe Fumarate-FA (PRENATAL MULTIVITAMIN) TABS tablet Take 1 tablet by mouth daily at 12 noon.   Taking  . ranitidine (ZANTAC) 150 MG tablet Take 1 tablet (150 mg  total) by mouth 2 (two) times daily. (Patient not taking: Reported on 02/19/2015) 120 tablet 2 Not Taking at Unknown time    Allergies  Allergen Reactions  . Magnesium-Containing Compounds Anaphylaxis  . Tramadol Itching    Review of Systems: Negative except for what is mentioned in HPI.  Physical Exam: LMP 05/23/2014 (LMP Unknown) GENERAL: Well-developed, well-nourished female in no acute distress.  LUNGS: Clear to auscultation bilaterally.  HEART: Regular rate and rhythm. ABDOMEN: Soft, nontender, nondistended, gravid. EXTREMITIES: Nontender, no edema, 2+ distal pulses.    Pertinent Labs/Studies:   No results found for this or any previous visit (from the past 24 hour(s)).  Assessment : Cassandra Duarte is a 30 y.o. [phone] at [redacted]w[redacted]d being admitted for labor.  Plan: Labor:  Induction with pitocin per protocol FWB: Reassuring fetal heart tracing.  GBS negative Delivery plan: Hopeful for vaginal delivery To check bs q4h  Melody Shambley, CNM [facility]   I have reviewed the record and concur with  patient management and plan. Channin Agustin, Hassell Done, MD, [place] \

## 2015-02-20 NOTE — Anesthesia Procedure Notes (Signed)
Epidural Patient location during procedure: OB Start time: 02/20/2015 7:18 PM End time: 02/20/2015 7:21 PM  Staffing Anesthesiologist: Katy Fitch K Performed by: anesthesiologist   Preanesthetic Checklist Completed: patient identified, site marked, surgical consent, pre-op evaluation, timeout performed, IV checked, risks and benefits discussed and monitors and equipment checked  Epidural Patient position: sitting Prep: Betadine Patient monitoring: heart rate, continuous pulse ox and blood pressure Approach: midline Location: L4-L5 Injection technique: LOR saline  Needle:  Needle type: Tuohy  Needle gauge: 18 G Needle length: 9 cm and 9 Needle insertion depth: 7 cm Catheter type: closed end flexible Catheter size: 20 Guage Catheter at skin depth: 11 cm Test dose: negative and 1.5% lidocaine with Epi 1:200 K  Assessment Sensory level: T8 Events: blood not aspirated, injection not painful, no injection resistance, negative IV test and no paresthesia  Additional Notes   Patient tolerated the insertion well without complications.Reason for block:procedure for pain

## 2015-02-20 NOTE — Progress Notes (Signed)
Cassandra Duarte is a 30 y.o. [id number] at [redacted]w[redacted]d by LMP admitted for induction of labor due to Gestational diabetes.  Subjective: Feels some pressure and mild pain with contractions. Sitting up in bed tolerating contractions well  Objective: BP 124/68 mmHg  Pulse 86  Temp(Src) 98 F (36.7 C) (Oral)  Resp 16  Ht 5\' 3"  (1.6 m)  Wt 75.297 kg (166 lb)  BMI 29.41 kg/m2  LMP 05/23/2014 (LMP Unknown)      FHT:  FHR: 121 bpm, variability: moderate,  accelerations:  Present,  decelerations:  Present variable UC:   irregular, every 3-5 minutes, moderate to palpation, on 6 mu/min of pitocin SVE:   Dilation: 4 Effacement (%): 70 Station: -2 Exam by:: M.Shambley CNM  Labs: Lab Results  Component Value Date   WBC 10.3 02/20/2015   HGB 12.2 02/20/2015   HCT 35.5 02/20/2015   MCV 87.8 02/20/2015   PLT 183 02/20/2015    Assessment / Plan: Induction of labor due to gestational diabetes,  progressing well on pitocin- actually AROM done with copious amounts clear fluid  Labor: Progressing on Pitocin, will continue to increase then AROM Preeclampsia:  labs stable Fetal Wellbeing:  Category II Pain Control:  Labor support without medications I/D:  n/a Anticipated MOD:  NSVD  Melody N Shambley 02/20/2015, 1:17 PM

## 2015-02-20 NOTE — Progress Notes (Signed)
Cassandra Duarte is a 30 y.o. [id number] at [redacted]w[redacted]d by LMP admitted for induction of labor due to Gestational diabetes.  Subjective: Reports painful contractions, desires epidural  Objective: BP 113/63 mmHg  Pulse 76  Temp(Src) 98 F (36.7 C) (Oral)  Resp 16  Ht 5\' 3"  (1.6 m)  Wt 75.297 kg (166 lb)  BMI 29.41 kg/m2  LMP 05/23/2014 (LMP Unknown)      FHT:  FHR: 119 bpm, variability: moderate,  accelerations:  Present,  decelerations:  Absent UC:   irregular, every 2-3 minutes, pitocin at 61mu/min SVE:   Dilation: 6 Effacement (%): 80 Station: -1 Exam by:: TFH  Labs: Lab Results  Component Value Date   WBC 10.3 02/20/2015   HGB 12.2 02/20/2015   HCT 35.5 02/20/2015   MCV 87.8 02/20/2015   PLT 183 02/20/2015    Assessment / Plan: Induction of labor due to gestational diabetes,  progressing well on pitocin  Labor: Progressing normally Preeclampsia:  labs stable Fetal Wellbeing:  Category II Pain Control:  Labor support without medications I/D:  n/a Anticipated MOD:  NSVD  [employer], CNM 02/20/2015, 7:03 PM

## 2015-02-20 NOTE — Anesthesia Preprocedure Evaluation (Signed)
Anesthesia Evaluation  Patient identified by MRN, date of birth, ID band Patient awake    Reviewed: Allergy & Precautions, H&P , NPO status , Patient's Chart, lab work & pertinent test results  History of Anesthesia Complications Negative for: history of anesthetic complications  Airway Mallampati: III  TM Distance: >3 FB Neck ROM: full    Dental  (+) Poor Dentition   Pulmonary neg shortness of breath, former smoker,    Pulmonary exam normal breath sounds clear to auscultation       Cardiovascular Exercise Tolerance: Good (-) hypertensionnegative cardio ROS Normal cardiovascular exam Rhythm:regular Rate:Normal     Neuro/Psych negative neurological ROS  negative psych ROS   GI/Hepatic negative GI ROS, Neg liver ROS,   Endo/Other  diabetes  Renal/GU negative Renal ROS  negative genitourinary   Musculoskeletal   Abdominal   Peds  Hematology negative hematology ROS (+)   Anesthesia Other Findings Past Medical History:   Vaginal Pap smear, abnormal                     2012           Comment:REPEAT AND WAS NORMAL   Gestational diabetes                            11/19/2014  Past Surgical History:   DILATION AND CURETTAGE OF UTERUS                 2014           Comment:WS  BMI    Body Mass Index   29.41 kg/m 2      Reproductive/Obstetrics (+) Pregnancy                             Anesthesia Physical Anesthesia Plan  ASA: III  Anesthesia Plan: Epidural   Post-op Pain Management:    Induction:   Airway Management Planned:   Additional Equipment:   Intra-op Plan:   Post-operative Plan:   Informed Consent: I have reviewed the patients History and Physical, chart, labs and discussed the procedure including the risks, benefits and alternatives for the proposed anesthesia with the patient or authorized representative who has indicated his/her understanding and acceptance.    Dental Advisory Given  Plan Discussed with: Anesthesiologist, CRNA and Surgeon  Anesthesia Plan Comments:         Anesthesia Quick Evaluation

## 2015-02-21 ENCOUNTER — Other Ambulatory Visit: Payer: Self-pay | Admitting: Obstetrics and Gynecology

## 2015-02-21 ENCOUNTER — Inpatient Hospital Stay: Payer: Medicaid Other | Admitting: *Deleted

## 2015-02-21 ENCOUNTER — Encounter
Admission: EM | Disposition: A | Discharge status: Home / Self Care | Payer: Self-pay | Source: Home / Self Care | Attending: Obstetrics and Gynecology

## 2015-02-21 DIAGNOSIS — Z302 Encounter for sterilization: Secondary | ICD-10-CM

## 2015-02-21 HISTORY — PX: TUBAL LIGATION: SHX77

## 2015-02-21 LAB — GLUCOSE, CAPILLARY
GLUCOSE-CAPILLARY: 64 mg/dL — AB (ref 65–99)
Glucose-Capillary: 71 mg/dL (ref 65–99)
Glucose-Capillary: 59 mg/dL — ABNORMAL LOW (ref 65–99)

## 2015-02-21 LAB — CBC
HEMATOCRIT: 31.2 % — AB (ref 35.0–47.0)
HEMOGLOBIN: 10.8 g/dL — AB (ref 12.0–16.0)
MCH: 30.3 pg (ref 26.0–34.0)
MCHC: 34.7 g/dL (ref 32.0–36.0)
MCV: 87.3 fL (ref 80.0–100.0)
Platelets: 185 10*3/uL (ref 150–440)
RBC: 3.57 MIL/uL — ABNORMAL LOW (ref 3.80–5.20)
RDW: 13.3 % (ref 11.5–14.5)
WBC: 15 10*3/uL — AB (ref 3.6–11.0)

## 2015-02-21 LAB — RPR: RPR Ser Ql: NONREACTIVE

## 2015-02-21 SURGERY — LIGATION, FALLOPIAN TUBE, BILATERAL
Anesthesia: General | Laterality: Bilateral

## 2015-02-21 MED ORDER — NEOSTIGMINE METHYLSULFATE 10 MG/10ML IV SOLN
INTRAVENOUS | Status: DC | PRN
  Administered 2015-02-21: 3 mg via INTRAVENOUS

## 2015-02-21 MED ORDER — DIBUCAINE 1 % RE OINT: 1.0000 "application " | TOPICAL_OINTMENT | RECTAL | Status: DC | PRN

## 2015-02-21 MED ORDER — DOCUSATE SODIUM 100 MG PO CAPS
100.0000 mg | ORAL_CAPSULE | Freq: Two times a day (BID) | ORAL | Status: DC
  Administered 2015-02-21: 100 mg via ORAL
  Filled 2015-02-21: qty 1

## 2015-02-21 MED ORDER — IBUPROFEN 800 MG PO TABS: 800.0000 mg | ORAL_TABLET | Freq: Three times a day (TID) | ORAL | Status: DC | PRN

## 2015-02-21 MED ORDER — METOCLOPRAMIDE HCL 5 MG/ML IJ SOLN: 10.0000 mg | Freq: Once | INTRAMUSCULAR | Status: DC | PRN

## 2015-02-21 MED ORDER — ONDANSETRON HCL 4 MG/2ML IJ SOLN
4.0000 mg | INTRAMUSCULAR | Status: DC | PRN
  Administered 2015-02-21: 4 mg via INTRAVENOUS

## 2015-02-21 MED ORDER — HYDROCODONE-ACETAMINOPHEN 5-325 MG PO TABS
1.0000 | ORAL_TABLET | ORAL | Status: DC | PRN
  Administered 2015-02-21 (×3): 2 via ORAL
  Filled 2015-02-21 (×3): qty 2

## 2015-02-21 MED ORDER — BENZOCAINE-MENTHOL 20-0.5 % EX AERO
1.0000 "application " | INHALATION_SPRAY | CUTANEOUS | Status: DC | PRN
  Administered 2015-02-21: 1 via TOPICAL
  Filled 2015-02-21 (×2): qty 56

## 2015-02-21 MED ORDER — LACTATED RINGERS IV SOLN
INTRAVENOUS | Status: DC
  Administered 2015-02-21 (×2): via INTRAVENOUS

## 2015-02-21 MED ORDER — LACTATED RINGERS IV SOLN: INTRAVENOUS | Status: DC

## 2015-02-21 MED ORDER — FENTANYL CITRATE (PF) 100 MCG/2ML IJ SOLN
INTRAMUSCULAR | Status: AC
  Administered 2015-02-21: 50 ug via INTRAVENOUS
  Filled 2015-02-21: qty 2

## 2015-02-21 MED ORDER — SIMETHICONE 80 MG PO CHEW: 80.0000 mg | CHEWABLE_TABLET | ORAL | Status: DC | PRN

## 2015-02-21 MED ORDER — ROCURONIUM BROMIDE 100 MG/10ML IV SOLN
INTRAVENOUS | Status: DC | PRN
  Administered 2015-02-21: 50 mg via INTRAVENOUS

## 2015-02-21 MED ORDER — WITCH HAZEL-GLYCERIN EX PADS: 1.0000 "application " | MEDICATED_PAD | CUTANEOUS | Status: DC | PRN

## 2015-02-21 MED ORDER — LIDOCAINE HCL (CARDIAC) 20 MG/ML IV SOLN
INTRAVENOUS | Status: DC | PRN
  Administered 2015-02-21: 30 mg via INTRAVENOUS

## 2015-02-21 MED ORDER — ONDANSETRON HCL 4 MG PO TABS: 4.0000 mg | ORAL_TABLET | ORAL | Status: DC | PRN

## 2015-02-21 MED ORDER — FENTANYL CITRATE (PF) 100 MCG/2ML IJ SOLN
50.0000 ug | Freq: Once | INTRAMUSCULAR | Status: AC
  Administered 2015-02-21: 50 ug via INTRAVENOUS

## 2015-02-21 MED ORDER — GLYCOPYRROLATE 0.2 MG/ML IJ SOLN
INTRAMUSCULAR | Status: DC | PRN
  Administered 2015-02-21: 0.6 mg via INTRAVENOUS

## 2015-02-21 MED ORDER — ZOLPIDEM TARTRATE 5 MG PO TABS: 5.0000 mg | ORAL_TABLET | Freq: Every evening | ORAL | Status: DC | PRN

## 2015-02-21 MED ORDER — IBUPROFEN 600 MG PO TABS
600.0000 mg | ORAL_TABLET | Freq: Four times a day (QID) | ORAL | Status: DC
  Administered 2015-02-21 – 2015-02-22 (×5): 600 mg via ORAL
  Filled 2015-02-21 (×4): qty 1

## 2015-02-21 MED ORDER — MIDAZOLAM HCL 2 MG/2ML IJ SOLN
INTRAMUSCULAR | Status: DC | PRN
  Administered 2015-02-21: 2 mg via INTRAVENOUS

## 2015-02-21 MED ORDER — DIPHENHYDRAMINE HCL 25 MG PO CAPS: 25.0000 mg | ORAL_CAPSULE | Freq: Four times a day (QID) | ORAL | Status: DC | PRN

## 2015-02-21 MED ORDER — FAMOTIDINE 20 MG PO TABS
40.0000 mg | ORAL_TABLET | Freq: Once | ORAL | Status: AC
  Administered 2015-02-21: 40 mg via ORAL
  Filled 2015-02-21: qty 2

## 2015-02-21 MED ORDER — ACETAMINOPHEN 325 MG PO TABS
650.0000 mg | ORAL_TABLET | ORAL | Status: DC | PRN
  Filled 2015-02-21 (×2): qty 2

## 2015-02-21 MED ORDER — FENTANYL CITRATE (PF) 100 MCG/2ML IJ SOLN
25.0000 ug | INTRAMUSCULAR | Status: DC | PRN
  Administered 2015-02-21 (×3): 50 ug via INTRAVENOUS

## 2015-02-21 MED ORDER — PROPOFOL 10 MG/ML IV BOLUS
INTRAVENOUS | Status: DC | PRN
  Administered 2015-02-21: 200 mg via INTRAVENOUS

## 2015-02-21 MED ORDER — FENTANYL CITRATE (PF) 100 MCG/2ML IJ SOLN
INTRAMUSCULAR | Status: DC | PRN
  Administered 2015-02-21 (×2): 50 ug via INTRAVENOUS

## 2015-02-21 MED ORDER — OXYCODONE-ACETAMINOPHEN 5-325 MG PO TABS
1.0000 | ORAL_TABLET | ORAL | Status: DC | PRN
  Administered 2015-02-21 – 2015-02-22 (×5): 2 via ORAL
  Filled 2015-02-21 (×5): qty 2

## 2015-02-21 MED ORDER — METOCLOPRAMIDE HCL 10 MG PO TABS
10.0000 mg | ORAL_TABLET | Freq: Once | ORAL | Status: AC
  Administered 2015-02-21: 10 mg via ORAL
  Filled 2015-02-21: qty 1

## 2015-02-21 MED ORDER — IBUPROFEN 600 MG PO TABS
ORAL_TABLET | ORAL | Status: AC
  Administered 2015-02-21: 600 mg via ORAL
  Filled 2015-02-21: qty 1

## 2015-02-21 MED ORDER — LANOLIN HYDROUS EX OINT: TOPICAL_OINTMENT | CUTANEOUS | Status: DC | PRN

## 2015-02-21 SURGICAL SUPPLY — 27 items
BLADE SURG SZ11 CARB STEEL (BLADE) ×3 IMPLANT
CATH ROBINSON RED A/P 16FR (CATHETERS) ×3 IMPLANT
CHLORAPREP W/TINT 26ML (MISCELLANEOUS) ×3 IMPLANT
DRAPE LAPAROTOMY 100X77 ABD (DRAPES) ×3 IMPLANT
DRSG TEGADERM 2-3/8X2-3/4 SM (GAUZE/BANDAGES/DRESSINGS) ×3 IMPLANT
GAUZE SPONGE NON-WVN 2X2 STRL (MISCELLANEOUS) ×1 IMPLANT
GLOVE BIOGEL PI IND STRL 6.5 (GLOVE) ×6 IMPLANT
GLOVE BIOGEL PI INDICATOR 6.5 (GLOVE) ×12
GOWN STRL REUS W/ TWL LRG LVL3 (GOWN DISPOSABLE) ×1 IMPLANT
GOWN STRL REUS W/TWL LRG LVL3 (GOWN DISPOSABLE) ×2
JELLY LUB 2OZ STRL (MISCELLANEOUS) ×2
JELLY LUBE 2OZ STRL (MISCELLANEOUS) ×1 IMPLANT
KIT RM TURNOVER CYSTO AR (KITS) ×3 IMPLANT
LIQUID BAND (GAUZE/BANDAGES/DRESSINGS) ×6 IMPLANT
NEEDLE HYPO 25GX1X1/2 BEV (NEEDLE) ×3 IMPLANT
NS IRRIG 500ML POUR BTL (IV SOLUTION) ×3 IMPLANT
PACK BASIN MINOR ARMC (MISCELLANEOUS) ×3 IMPLANT
SCRUB POVIDONE IODINE 4 OZ (MISCELLANEOUS) ×3 IMPLANT
SPONGE VERSALON 2X2 STRL (MISCELLANEOUS) ×2
SUT MNCRL 4-0 (SUTURE) ×2
SUT MNCRL 4-0 27XMFL (SUTURE) ×1
SUT PLAIN GUT 0 (SUTURE) ×6 IMPLANT
SUT VIC AB 0 CT1 36 (SUTURE) ×3 IMPLANT
SUT VIC AB 0 SH 27 (SUTURE) ×3 IMPLANT
SUT VICRYL 0 AB UR-6 (SUTURE) ×3 IMPLANT
SUTURE MNCRL 4-0 27XMF (SUTURE) ×1 IMPLANT
SYRINGE 10CC LL (SYRINGE) ×3 IMPLANT

## 2015-02-21 NOTE — Addendum Note (Signed)
Addendum  created 02/21/15 1602 by Elyse Hsu, MD   Modules edited: Anesthesia Events, Narrator   Narrator:  Narrator: Event Log Edited

## 2015-02-21 NOTE — Op Note (Signed)
OPERATIVE NOTE:  Cassandra Duarte PROCEDURE DATE: 02/21/2015   PREOPERATIVE DIAGNOSIS:  1. Postpartum 2. Desires elective sterilization POSTOPERATIVE DIAGNOSIS: Same as above PROCEDURE: Postpartum bilateral tubal ligation (bilateral partial salpingectomy)  SURGEON:  Brayton Mars, MD ASSISTANTS: PA-S Amy Yu ANESTHESIA: General INDICATIONS: 30 y.o. [id number] postpartum, status post vaginal delivery, presents for bilateral partial salpingectomy. Patient does not desire reversible methods of contraception  FINDINGS:  Normal tubes bilaterally   I/O's: Total I/O In: 700 [I.V.:700] Out: 1721 [Urine:1720; Blood:1] COUNTS:  YES SPECIMENS: Right and left fallopian tube segments ANTIBIOTIC PROPHYLAXIS:N/A COMPLICATIONS: None immediate  PROCEDURE IN DETAIL: Patient was brought to the operating room versus placed in the supine position. General endotracheal anesthesia was induced without difficulty. Red Robinson catheter drainage was performed to empty the bladder with production of 150 mL of urine. Patient underwent a ChloraPrep abdominal prep and drape in standard fashion. Subumbilical transverse incision 3 cm in diameter was made with extension into the subcutaneous tissues to fascia, and peritoneum. [employer] retractors were used for exposure. Sequentially each adnexal structure was identified. The fallopian tube was grasped with a Babcock clamp and carried out to the fimbriated ends to verify appropriate stretch was then ligated. The midsegment portion of the tube was then regrasped. A free tie of 0 plain suture was used to tie off the tube. A second suture ligature of 0 point suture was also used to tie off the structure. The intervening tube segment was resected. Similar procedure was carried out on the contralateral tube. Good hemostasis was noted. Upon completion of the procedure the incision was then closed with 0 Vicryl being used to close the fascia and peritoneum in a simple  running manner. Skin was closed with a simple running stitch of it was 4-0 Monocryl. Telfa and Tegaderm pressure pressure dressing was placed. Patient was then awakened extubated and taken to recovery in satisfactory condition.  [name] A. Zipporah Plants, MD, ACOG [facility]

## 2015-02-21 NOTE — Anesthesia Procedure Notes (Signed)
Procedure Name: Intubation Date/Time: 02/21/2015 2:01 PM Performed by: Courtney Paris Pre-anesthesia Checklist: Patient identified, Emergency Drugs available, Suction available, Patient being monitored and Timeout performed Patient Re-evaluated:Patient Re-evaluated prior to inductionOxygen Delivery Method: Circle system utilized Preoxygenation: Pre-oxygenation with 100% oxygen Intubation Type: Combination inhalational/ intravenous induction and IV induction Ventilation: Mask ventilation without difficulty Laryngoscope Size: Miller and 3 Grade View: Grade II Tube type: Oral Tube size: 7.0 mm Number of attempts: 1 Airway Equipment and Method: Stylet Placement Confirmation: ETT inserted through vocal cords under direct vision,  positive ETCO2,  CO2 detector and breath sounds checked- equal and bilateral Secured at: 20 cm Tube secured with: Tape Dental Injury: Teeth and Oropharynx as per pre-operative assessment

## 2015-02-21 NOTE — Transfer of Care (Signed)
Immediate Anesthesia Transfer of Care Note  Patient: Cassandra Duarte  Procedure(s) Performed: Procedure(s): BILATERAL TUBAL LIGATION (Bilateral)  Patient Location: PACU  Anesthesia Type:General  Level of Consciousness: awake, sedated and patient cooperative  Airway & Oxygen Therapy: Patient Spontanous Breathing and Patient connected to face mask oxygen  Post-op Assessment: Report given to RN and Post -op Vital signs reviewed and stable  Post vital signs: Reviewed and stable  Last Vitals:  Filed Vitals:   02/21/15 1230 02/21/15 1308  BP: 94/57 94/63  Pulse: 77 62  Temp: 36.7 C 36.1 C  Resp: 16 16    Complications: No apparent anesthesia complications

## 2015-02-21 NOTE — Anesthesia Post-op Follow-up Note (Signed)
  Anesthesia Pain Follow-up Note  Patient: Cassandra Duarte  Day #: 1  Date of Follow-up: 02/21/2015 Time: 7:19 AM  Last Vitals:  Filed Vitals:   02/21/15 0100 02/21/15 0337  BP: 116/71 103/49  Pulse: 94 93  Temp:  37.4 C  Resp:  18    Level of Consciousness: alert  Pain: mild   Side Effects:None  Catheter Site Exam:site not evaluated  Plan: D/C from anesthesia care  Blima Singer

## 2015-02-21 NOTE — Anesthesia Preprocedure Evaluation (Signed)
Anesthesia Evaluation  Patient identified by MRN, date of birth, ID band Patient awake    Reviewed: Allergy & Precautions, NPO status , Patient's Chart, lab work & pertinent test results  Airway Mallampati: I  TM Distance: >3 FB Neck ROM: Full    Dental  (+) Teeth Intact   Pulmonary former smoker,    Pulmonary exam normal        Cardiovascular Exercise Tolerance: Good negative cardio ROS Normal cardiovascular exam     Neuro/Psych    GI/Hepatic   Endo/Other  diabetes, Well Controlled, GestationalResolved now post-partum.  Renal/GU      Musculoskeletal   Abdominal (+)  Abdomen: soft.    Peds  Hematology  (+) anemia ,   Anesthesia Other Findings   Reproductive/Obstetrics                             Anesthesia Physical Anesthesia Plan  ASA: II  Anesthesia Plan: General   Post-op Pain Management:    Induction: Intravenous  Airway Management Planned: Oral ETT  Additional Equipment:   Intra-op Plan:   Post-operative Plan: Extubation in OR  Informed Consent: I have reviewed the patients History and Physical, chart, labs and discussed the procedure including the risks, benefits and alternatives for the proposed anesthesia with the patient or authorized representative who has indicated his/her understanding and acceptance.     Plan Discussed with: CRNA  Anesthesia Plan Comments:         Anesthesia Quick Evaluation

## 2015-02-21 NOTE — Interval H&P Note (Signed)
History and Physical Interval Note:  02/21/2015 12:23 PM  Cassandra Duarte  has presented today for surgery, with the diagnosis of desires sterilization  The various methods of treatment have been discussed with the patient and family. After consideration of risks, benefits and other options for treatment, the patient has consented to  Procedure(s): BILATERAL TUBAL LIGATION (Bilateral) as a surgical intervention .  The patient's history has been reviewed, patient examined, no change in status, stable for surgery.  I have reviewed the patient's chart and labs.  Questions were answered to the patient's satisfaction.     Hassell Done A Fidela Cieslak

## 2015-02-21 NOTE — Anesthesia Postprocedure Evaluation (Signed)
Anesthesia Post Note  Patient: Cassandra Duarte  Procedure(s) Performed: Procedure(s) (LRB): BILATERAL TUBAL LIGATION (Bilateral)  Patient location during evaluation: PACU Anesthesia Type: General Level of consciousness: awake and alert Pain management: pain level controlled Vital Signs Assessment: post-procedure vital signs reviewed and stable Respiratory status: spontaneous breathing Cardiovascular status: blood pressure returned to baseline Postop Assessment: no headache Anesthetic complications: no    Last Vitals:  Filed Vitals:   02/21/15 1532 02/21/15 1540  BP:  111/70  Pulse: 92 69  Temp: 36.5 C   Resp: 20 12    Last Pain:  Filed Vitals:   02/21/15 1542  PainSc: 5                  [name] M

## 2015-02-21 NOTE — Anesthesia Postprocedure Evaluation (Signed)
Anesthesia Post Note  Patient: Cassandra Duarte  Procedure(s) Performed: * No procedures listed *  Patient location during evaluation: [facility] Anesthesia Type: Epidural Level of consciousness: oriented and awake and alert Pain management: satisfactory to patient Vital Signs Assessment: post-procedure vital signs reviewed and stable Respiratory status: respiratory function stable Cardiovascular status: stable Postop Assessment: no headache and no backache Anesthetic complications: no    Last Vitals:  Filed Vitals:   02/21/15 0100 02/21/15 0337  BP: 116/71 103/49  Pulse: 94 93  Temp:  37.4 C  Resp:  18    Last Pain:  Filed Vitals:   02/21/15 0512  PainSc: 6                  Blima Singer

## 2015-02-22 DIAGNOSIS — Z302 Encounter for sterilization: Secondary | ICD-10-CM

## 2015-02-22 MED ORDER — OXYCODONE-ACETAMINOPHEN 5-325 MG PO TABS: 1.0000 | ORAL_TABLET | Freq: Four times a day (QID) | ORAL | Status: DC | PRN

## 2015-02-22 MED ORDER — DOCUSATE SODIUM 100 MG PO CAPS: 100.0000 mg | ORAL_CAPSULE | Freq: Two times a day (BID) | ORAL | Status: DC

## 2015-02-22 MED ORDER — IBUPROFEN 800 MG PO TABS: 800.0000 mg | ORAL_TABLET | Freq: Three times a day (TID) | ORAL | Status: DC | PRN

## 2015-02-22 MED ORDER — PROMETHAZINE HCL 25 MG PO TABS: 25.0000 mg | ORAL_TABLET | Freq: Four times a day (QID) | ORAL | Status: DC | PRN

## 2015-02-22 MED ORDER — HYDROCODONE-ACETAMINOPHEN 5-325 MG PO TABS: 1.0000 | ORAL_TABLET | ORAL | Status: DC | PRN

## 2015-02-22 NOTE — Progress Notes (Signed)
Discharge instructions complete and prescriptions given. Patient verbalizes understanding of teaching. Patient discharged home at 1140.

## 2015-02-22 NOTE — Discharge Summary (Signed)
Obstetric Discharge Summary Reason for Admission: induction of labor Prenatal Procedures: NST and ultrasound Intrapartum Procedures: spontaneous vaginal delivery and tubal ligation Postpartum Procedures: P.P. tubal ligation Complications-Operative and Postpartum: none HEMOGLOBIN  Date Value Ref Range Status  02/21/2015 10.8* 12.0 - 16.0 g/dL Final   HGB  Date Value Ref Range Status  04/12/2014 13.2 12.0-16.0 g/dL Final   HCT  Date Value Ref Range Status  02/21/2015 31.2* 35.0 - 47.0 % Final  04/12/2014 38.7 35.0-47.0 % Final   HEMATOCRIT  Date Value Ref Range Status  12/03/2014 31.8* 34.0 - 46.6 % Final    Physical Exam:  General: alert, cooperative and appears stated age 30: appropriate Uterine Fundus: firm Incision: healing well, no significant drainage DVT Evaluation: No evidence of DVT seen on physical exam. Calf/Ankle edema is present.  Discharge Diagnoses: Term Pregnancy-delivered and GDM, PPTL  Discharge Information: Date: 02/22/2015 Activity: pelvic rest Diet: routine Medications: PNV, Tylenol #3, Ibuprofen and Colace Condition: stable Instructions: refer to practice specific booklet Discharge to: home   Newborn Data: Live born female circumcision not done at hospital Birth Weight: 8 lb 11.7 oz (3960 g) APGAR: 8, 9  Home with mother.  Melody [employer], CNM 02/22/2015, 9:47 AM

## 2015-02-24 ENCOUNTER — Encounter: Payer: Self-pay | Admitting: Obstetrics and Gynecology

## 2015-02-24 ENCOUNTER — Telehealth: Payer: Self-pay | Admitting: Obstetrics and Gynecology

## 2015-02-24 NOTE — Telephone Encounter (Signed)
FEET AND ANKLE ARE VERY SWOLLEN, [place] THIS PAST Saturday.

## 2015-02-24 NOTE — Telephone Encounter (Signed)
Pt came by the office and her feet and ankles were very swollen, pt states she is drinking plenty of fluids and is staying off her feet, I text MNS and she advised me to tell pt to wrap her ankles and feet in ace bandage and to hydrate pt voiced understanding

## 2015-02-24 NOTE — Addendum Note (Signed)
Addendum  created 02/24/15 1453 by Elyse Hsu, MD   Modules edited: Anesthesia Responsible Staff

## 2015-02-25 ENCOUNTER — Encounter: Payer: Self-pay | Admitting: Obstetrics and Gynecology

## 2015-02-25 ENCOUNTER — Ambulatory Visit (INDEPENDENT_AMBULATORY_CARE_PROVIDER_SITE_OTHER): Payer: Medicaid Other | Admitting: Obstetrics and Gynecology

## 2015-02-25 VITALS — BP 123/82 | HR 82 | Wt 164.1 lb

## 2015-02-25 DIAGNOSIS — R102 Pelvic and perineal pain: Secondary | ICD-10-CM | POA: Diagnosis not present

## 2015-02-25 DIAGNOSIS — R600 Localized edema: Secondary | ICD-10-CM | POA: Diagnosis not present

## 2015-02-25 DIAGNOSIS — O9089 Other complications of the puerperium, not elsewhere classified: Secondary | ICD-10-CM

## 2015-02-25 LAB — SURGICAL PATHOLOGY

## 2015-02-25 MED ORDER — LIDOCAINE HCL 2 % EX GEL: 1.0000 "application " | CUTANEOUS | Status: DC | PRN

## 2015-02-25 MED ORDER — FUROSEMIDE 20 MG PO TABS: 20.0000 mg | ORAL_TABLET | Freq: Every day | ORAL | Status: DC

## 2015-02-25 NOTE — Progress Notes (Signed)
Subjective:     Patient ID: Cassandra Duarte, female   DOB: [birth year], 30 y.o.   MRN: [id number]  HPI 4 days post vaginal delivery of female infant, reports progressively worsening bilateral lower leg edema since delivery. Has not been keeping legs elevated as infant had to be re-hospitalized due to jaundice.  Did wrap legs with ace wrap as directed yesterday, but doesn't feel like it helped. Also reports burning when urine hits skin where superficial laceration occurred at delivery. Has been limiting po intake to try to minimize voiding.  Review of Systems See above    Objective:   Physical Exam A&O x4  well groomed female in mild distress and crying, states just miserable and worried about infant. HRR, Lungs clear Bilateral non-pitting edema moderate to knees, pulses palpable Pelvic exam refused UA normal Blood pressure 123/82, pulse 82, weight 164 lb 1.6 oz (74.435 kg), unknown if currently breastfeeding.    Assessment:     Bilateral postpartum edema Perineal pain with urination, postpartum     Plan:     Lidocaine gel RX to be applied prn RX lasix 53m po as needed- patient doesn't want to take at this time, but will hold on to RX. Encouraged to increase water intake and stop sodas, elevated legs as much as possible. RTC if worsens  Molly Savarino SMenard CNM

## 2015-03-13 ENCOUNTER — Ambulatory Visit: Payer: Medicaid Other | Admitting: Family Medicine

## 2015-03-17 ENCOUNTER — Encounter: Payer: Self-pay | Admitting: Family Medicine

## 2015-03-17 ENCOUNTER — Ambulatory Visit: Payer: Medicaid Other | Admitting: Family Medicine

## 2015-03-18 ENCOUNTER — Encounter: Payer: Self-pay | Admitting: Family Medicine

## 2015-03-18 ENCOUNTER — Ambulatory Visit (INDEPENDENT_AMBULATORY_CARE_PROVIDER_SITE_OTHER): Payer: Medicaid Other | Admitting: Family Medicine

## 2015-03-18 VITALS — BP 122/77 | HR 72 | Temp 98.5°F | Ht 62.5 in | Wt 148.0 lb

## 2015-03-18 DIAGNOSIS — D485 Neoplasm of uncertain behavior of skin: Secondary | ICD-10-CM | POA: Diagnosis not present

## 2015-03-18 NOTE — Patient Instructions (Signed)
Moles  Moles are usually harmless growths on the skin. They are accumulations of color (pigment) cells in the skin that:   · Can be various colors, from light brown to black.  · Can appear anywhere on the body.  · May remain flat or become raised.  · May contain hairs.  · May remain smooth or develop wrinkling.  Most moles are not cancerous (benign). However, some moles may develop changes and become cancerous. It is important to check your moles every month. If you check your moles regularly, you will be able to notice any changes that may occur.   CAUSES   Moles occur when skin cells grow together in clusters instead of spreading out in the skin as they normally do. The reason for this clustering is unknown.  DIAGNOSIS   Your caregiver will perform a skin examination to diagnose your mole.   TREATMENT   Moles usually do not require treatment. If a mole becomes worrisome, your caregiver may choose to take a sample of the mole or remove it entirely, and then send it to a lab for examination.   HOME CARE INSTRUCTIONS  · Check your mole(s) monthly for changes that may indicate skin cancer. These changes can include:    A change in size.    A change in color. Note that moles tend to darken during pregnancy or when taking birth control pills (oral contraception).    A change in shape.    A change in the border of the mole.  · Wear sunscreen (with an SPF of at least 30) when you spend long periods of time outside. Reapply the sunscreen every 2-3 hours.  · Schedule annual appointments with your skin doctor (dermatologist) if you have a large number of moles.  SEEK MEDICAL CARE IF:  · Your mole changes size, especially if it becomes larger than a pencil eraser.  · Your mole changes in color or develops more than one color.   · Your mole becomes itchy or bleeds.  · Your mole, or the skin near the mole, becomes painful, sore, red, or swollen.  · Your mole becomes scaly, sheds skin, or oozes fluid.   · Your mole develops  irregular borders.  · Your mole becomes flat or develops raised areas.  · Your mole becomes hard or soft.      This information is not intended to replace advice given to you by your health care provider. Make sure you discuss any questions you have with your health care provider.     Document Released: 09/29/2000 Document Revised: 09/29/2011 Document Reviewed: 07/19/2011  Elsevier Interactive Patient Education ©2016 [employer]

## 2015-03-18 NOTE — Progress Notes (Signed)
BP 122/77 mmHg  Pulse 72  Temp(Src) 98.5 F (36.9 C)  Ht 5' 2.5" (1.588 m)  Wt 148 lb (67.132 kg)  BMI 26.62 kg/m2  SpO2 98%   Subjective:    Patient ID: Cassandra Duarte, female    DOB: 12-04-1985, 30 y.o.   MRN: [id number]  HPI: Cassandra Duarte is a 30 y.o. female  Chief Complaint  Patient presents with  . Nevus    Referral to dermatology, patient has an appoinment scheduled for tomorrow.   SKIN LESIONS Duration: chronic Location: couple on her face- changed, has gotten bigger and lumpy, one on her chest- hurts Painful: yes Itching: yes Onset: gradual Context: changing- has gotten lighter, was darker Associated signs and symptoms: none History of skin cancer: no History of precancerous skin lesions: no Family history of skin cancer: yes- Mom had melanoma  Relevant past medical, surgical, family and social history reviewed and updated as indicated. Interim medical history since our last visit reviewed. Allergies and medications reviewed and updated.  Review of Systems  Constitutional: Negative.   Respiratory: Negative.   Cardiovascular: Negative.   Musculoskeletal: Negative.   Skin: Negative.  Negative for color change, pallor, rash and wound.  Psychiatric/Behavioral: Negative.     Per HPI unless specifically indicated above     Objective:    BP 122/77 mmHg  Pulse 72  Temp(Src) 98.5 F (36.9 C)  Ht 5' 2.5" (1.588 m)  Wt 148 lb (67.132 kg)  BMI 26.62 kg/m2  SpO2 98%  Wt Readings from Last 3 Encounters:  03/18/15 148 lb (67.132 kg)  05/21/14 145 lb (65.772 kg)  02/25/15 164 lb 1.6 oz (74.435 kg)    Physical Exam  Constitutional: She is oriented to person, place, and time. She appears well-developed and well-nourished. No distress.  HENT:  Head: Normocephalic and atraumatic.  Right Ear: Hearing normal.  Left Ear: Hearing normal.  Nose: Nose normal.  Eyes: Conjunctivae and lids are normal. Right eye exhibits no discharge. Left eye exhibits no  discharge. No scleral icterus.  Cardiovascular: Normal rate, regular rhythm, normal heart sounds and intact distal pulses.  Exam reveals no gallop and no friction rub.   No murmur heard. Pulmonary/Chest: Effort normal and breath sounds normal. No respiratory distress. She has no wheezes. She has no rales. She exhibits no tenderness.  Musculoskeletal: Normal range of motion.  Neurological: She is alert and oriented to person, place, and time.  Skin: Skin is warm, dry and intact. No rash noted. No erythema. No pallor.  Slightly irregular, raised, pink lesion on the R chest about 0.5cm Hyperpigmented lesion on L side of the face by her ear about 0.5cm each  Psychiatric: She has a normal mood and affect. Her speech is normal and behavior is normal. Judgment and thought content normal. Cognition and memory are normal.  Nursing note and vitals reviewed.   Results for orders placed or performed during the hospital encounter of 02/20/15  CBC  Result Value Ref Range   WBC 10.3 3.6 - 11.0 K/uL   RBC 4.04 3.80 - 5.20 MIL/uL   Hemoglobin 12.2 12.0 - 16.0 g/dL   HCT 35.5 35.0 - 47.0 %   MCV 87.8 80.0 - 100.0 fL   MCH 30.2 26.0 - 34.0 pg   MCHC 34.4 32.0 - 36.0 g/dL   RDW 13.8 11.5 - 14.5 %   Platelets 183 150 - 440 K/uL  RPR  Result Value Ref Range   RPR Ser Ql Non Reactive Non Reactive  Glucose, capillary  Result Value Ref Range   Glucose-Capillary 90 65 - 99 mg/dL  Glucose, capillary  Result Value Ref Range   Glucose-Capillary 84 65 - 99 mg/dL  Glucose, capillary  Result Value Ref Range   Glucose-Capillary 83 65 - 99 mg/dL  CBC  Result Value Ref Range   WBC 15.0 (H) 3.6 - 11.0 K/uL   RBC 3.57 (L) 3.80 - 5.20 MIL/uL   Hemoglobin 10.8 (L) 12.0 - 16.0 g/dL   HCT 31.2 (L) 35.0 - 47.0 %   MCV 87.3 80.0 - 100.0 fL   MCH 30.3 26.0 - 34.0 pg   MCHC 34.7 32.0 - 36.0 g/dL   RDW 13.3 11.5 - 14.5 %   Platelets 185 150 - 440 K/uL  Glucose, capillary  Result Value Ref Range    Glucose-Capillary 71 65 - 99 mg/dL  Glucose, capillary  Result Value Ref Range   Glucose-Capillary 59 (L) 65 - 99 mg/dL  Glucose, capillary  Result Value Ref Range   Glucose-Capillary 64 (L) 65 - 99 mg/dL  Type and screen [facility]  Result Value Ref Range   ABO/RH(D) A POS    Antibody Screen NEG    Sample Expiration 02/23/2015   ABO/Rh  Result Value Ref Range   ABO/RH(D) A POS   Surgical pathology  Result Value Ref Range   SURGICAL PATHOLOGY      Surgical Pathology CASE: [id number] PATIENT: [facility] Surgical Pathology Report     SPECIMEN SUBMITTED: A. Tubal segment, left B. Tubal segment, right  CLINICAL HISTORY: None provided  PRE-OPERATIVE DIAGNOSIS: Desires permanent sterilization  POST-OPERATIVE DIAGNOSIS: Desires permanent sterilization     DIAGNOSIS: A. LEFT FALLOPIAN TUBE SEGMENT; STERILIZATION: - FALLOPIAN TUBE SEGMENT WITH FULL CROSS SECTION OF THE LUMEN.  B. RIGHT FALLOPIAN TUBE SEGMENT; STERILIZATION: - FALLOPIAN TUBE SEGMENT WITH FULL CROSS SECTION OF THE LUMEN.   GROSS DESCRIPTION: A. Labeled: Left tubal segment  Size: 2.3 x 0.6 cm  Other findings: Cylindrically shaped piece of unremarkable fallopian tube, sectioned  Block summary: 1- representative cross-sections  B. Labeled: Right tubal segment  Size: 2.7 x 0.6 cm  Other findings: Segment of pink red unremarkable fallopian tube, sectioned  Block summary: 1- representative cross-sections  Final Diagnosis  performed by Delorse Lek, MD.  Electronically signed 02/25/2015 12:09:22PM    The electronic signature indicates that the named Attending Pathologist has evaluated the specimen  Technical component performed at [facility], [address] [place], [place] [address] Lab: [phone] Dir: Darrick Penna. Evette Doffing, MD  Professional component performed at [facility], [facility], [place], [place], [place] [address] Lab:  [phone] Dir: Dellia Nims. Reuel Derby, MD        Assessment & Plan:   Problem List Items Addressed This Visit    None    Visit Diagnoses    Neoplasm of uncertain behavior of skin    -  Primary    Seeing dermatology tomorrow. Referral generated. Good idea given family history of melanoma.     Relevant Orders    Ambulatory referral to Dermatology        Follow up plan: Return if symptoms worsen or fail to improve.

## 2015-04-07 NOTE — OB Triage Provider Note (Signed)
Presents with reports of contractions all day, no cervical change noted and no contractions noted on EFM, abdomen soft and non-tender.   NST INTERPRETATION:  Indications: decreased fetal movement and rule out uterine contractions  Mode: External Baseline Rate (A): 155 bpm Variability: Moderate Accelerations: 15 x 15 Decelerations: None     Contraction Frequency (min): rare   Impression: reactive   Plan: D/C home until planned IOL in am.    Fontella Shan Rockney Ghee, CNM

## 2015-04-08 ENCOUNTER — Ambulatory Visit (INDEPENDENT_AMBULATORY_CARE_PROVIDER_SITE_OTHER): Payer: Medicaid Other | Admitting: Obstetrics and Gynecology

## 2015-04-08 ENCOUNTER — Encounter: Payer: Self-pay | Admitting: Obstetrics and Gynecology

## 2015-04-08 DIAGNOSIS — R300 Dysuria: Secondary | ICD-10-CM

## 2015-04-08 LAB — POCT URINALYSIS DIPSTICK
Bilirubin, UA: NEGATIVE
Blood, UA: NEGATIVE
Glucose, UA: NEGATIVE
KETONES UA: NEGATIVE
LEUKOCYTES UA: NEGATIVE
NITRITE UA: NEGATIVE
PH UA: 6
PROTEIN UA: NEGATIVE
Spec Grav, UA: 1.02
UROBILINOGEN UA: 0.2

## 2015-04-08 NOTE — Patient Instructions (Signed)
  Place postpartum visit patient instructions here.  

## 2015-04-08 NOTE — Progress Notes (Signed)
  Subjective:     Cassandra Duarte is a 30 y.o. female who presents for a postpartum visit. She is 6 weeks postpartum following a spontaneous vaginal delivery. I have fully reviewed the prenatal and intrapartum course. The delivery was at 97 gestational weeks. Outcome: spontaneous vaginal delivery and BTL. Anesthesia: epidural. Postpartum course has been uncomplicated. Baby's course has been uncomplicated. Baby is feeding by breast. Bleeding no bleeding. Bowel function is normal. Bladder function is increased frequency last week. Patient is not sexually active. Contraception method is tubal ligation. Postpartum depression screening: negative.  The following portions of the patient's history were reviewed and updated as appropriate: allergies, current medications, past family history, past medical history, past social history, past surgical history and problem list.  Review of Systems Pertinent items noted in HPI and remainder of comprehensive ROS otherwise negative.   Objective:    BP 115/67 mmHg  Pulse 76  Ht 5\' 3"  (1.6 m)  Wt 145 lb 12.8 oz (66.134 kg)  BMI 25.83 kg/m2  Breastfeeding? Yes  General:  alert, cooperative and appears stated age   Breasts:  not checked  Lungs: clear to auscultation bilaterally  Heart:  regular rate and rhythm, S1, S2 normal, no murmur, click, rub or gallop  Abdomen: soft, non-tender; bowel sounds normal; no masses,  no organomegaly   Vulva:  normal  Vagina: normal vagina, no discharge, exudate, lesion, or erythema  Cervix:  multiparous appearance  Corpus: normal size, contour, position, consistency, mobility, non-tender  Adnexa:  normal adnexa and no mass, fullness, tenderness  Rectal Exam: Not performed.        Assessment:     6 weeks postpartum exam. Pap smear not done at today's visit.   Plan:    1. Contraception: tubal ligation 2. Postpartum anemia and s/p GDM- labs obtained 3. Follow up in: 4 months for AE or as needed.

## 2015-04-09 ENCOUNTER — Other Ambulatory Visit: Payer: Self-pay | Admitting: Obstetrics and Gynecology

## 2015-04-09 ENCOUNTER — Telehealth: Payer: Self-pay | Admitting: *Deleted

## 2015-04-09 DIAGNOSIS — E559 Vitamin D deficiency, unspecified: Secondary | ICD-10-CM

## 2015-04-09 LAB — CBC
HEMATOCRIT: 40.7 % (ref 34.0–46.6)
Hemoglobin: 13.5 g/dL (ref 11.1–15.9)
MCH: 28.9 pg (ref 26.6–33.0)
MCHC: 33.2 g/dL (ref 31.5–35.7)
MCV: 87 fL (ref 79–97)
PLATELETS: 220 10*3/uL (ref 150–379)
RBC: 4.67 x10E6/uL (ref 3.77–5.28)
RDW: 13.2 % (ref 12.3–15.4)
WBC: 7.5 10*3/uL (ref 3.4–10.8)

## 2015-04-09 LAB — IRON: Iron: 93 ug/dL (ref 27–159)

## 2015-04-09 LAB — GLUCOSE, RANDOM: GLUCOSE: 91 mg/dL (ref 65–99)

## 2015-04-09 LAB — VITAMIN D 25 HYDROXY (VIT D DEFICIENCY, FRACTURES): Vit D, 25-Hydroxy: 18.1 ng/mL — ABNORMAL LOW (ref 30.0–100.0)

## 2015-04-09 MED ORDER — VITAMIN D (ERGOCALCIFEROL) 1.25 MG (50000 UNIT) PO CAPS: 50000.0000 [IU] | ORAL_CAPSULE | ORAL | Status: DC

## 2015-04-09 NOTE — Telephone Encounter (Signed)
-----   Message from Joylene Igo, [place] sent at 04/09/2015 12:07 PM EDT ----- Please mail info on vit d def.

## 2015-04-09 NOTE — Telephone Encounter (Signed)
Mailed info to pt 

## 2015-05-06 ENCOUNTER — Telehealth: Payer: Self-pay | Admitting: *Deleted

## 2015-05-06 NOTE — Telephone Encounter (Signed)
Patient called and stated that she has a health form that needs to be filled out by the provider before she return to work. Patient states that she has some questions about that form and would like to talk to the nurse. Patient is requesting a call back . [phone]. Thanks

## 2015-05-06 NOTE — Telephone Encounter (Signed)
Pt is going to bring form by

## 2015-05-13 ENCOUNTER — Ambulatory Visit: Payer: Medicaid Other | Admitting: Family Medicine

## 2015-08-08 ENCOUNTER — Encounter: Payer: Medicaid Other | Admitting: Obstetrics and Gynecology

## 2015-08-10 IMAGING — US US OB COMP LESS 14 WK
1 series · 14 of 28 positions shown · non-contrast
Comparison: 05/24/2014

CLINICAL DATA: Patient is pregnant. Quantitative beta HCG level is
ultrasound. Patient complaining of lower abdominal cramping and back
pain and vaginal spotting.

EXAM:
OBSTETRIC <14 WK ULTRASOUND
TECHNIQUE: Transabdominal ultrasound was performed for evaluation of the
gestation as well as the maternal uterus and adnexal regions.

[Series 1: us ob comp less 14 wk · 0.22mm/px · 14 of 63 slices shown]
[im 3/63]
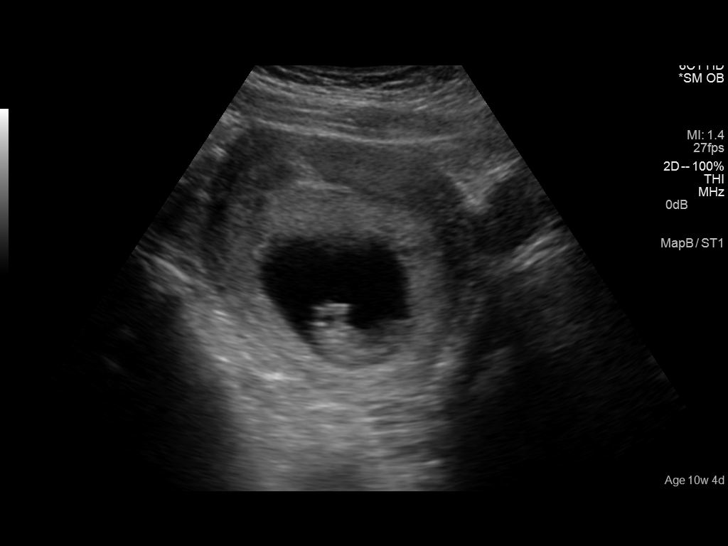
[im 7/63]
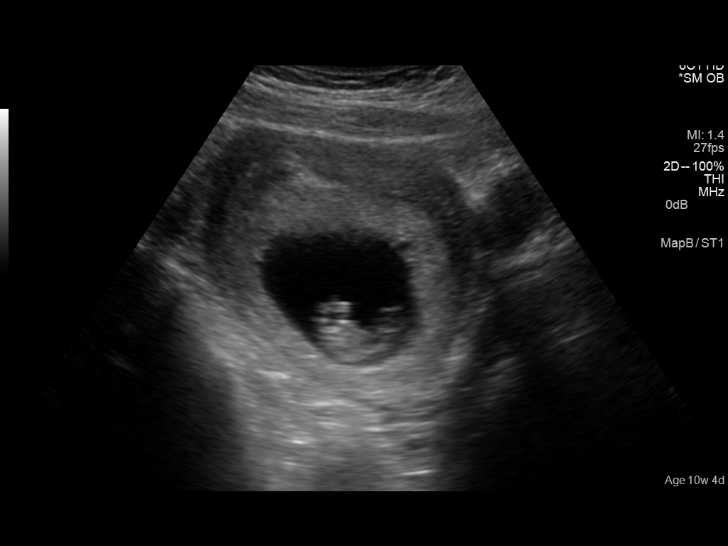
[im 12/63]
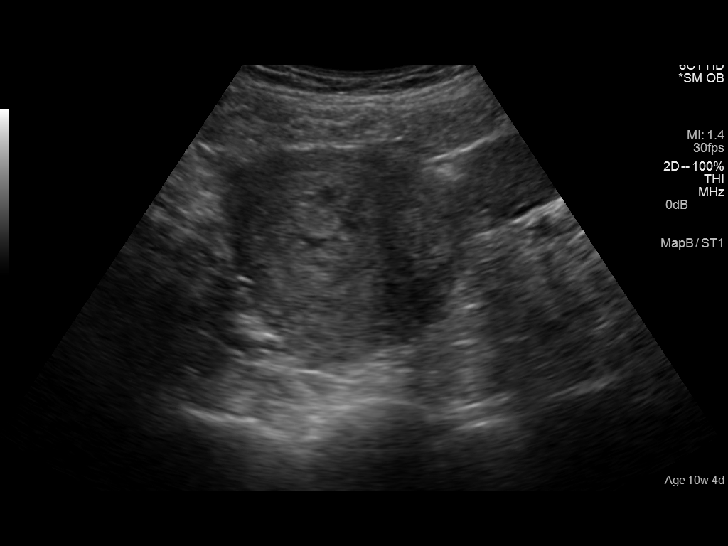
[im 17/63]
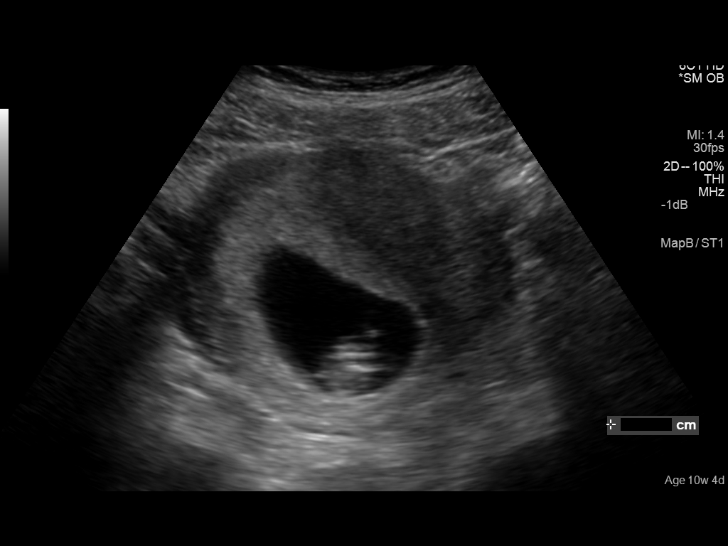
[im 21/63]
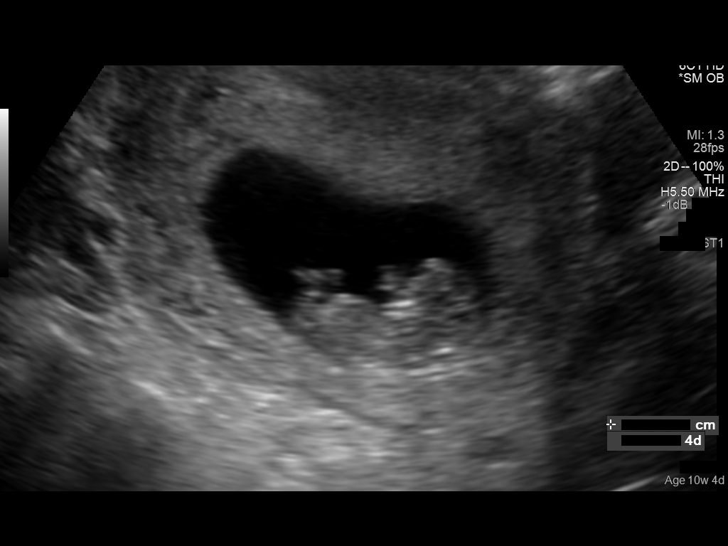
[im 26/63]
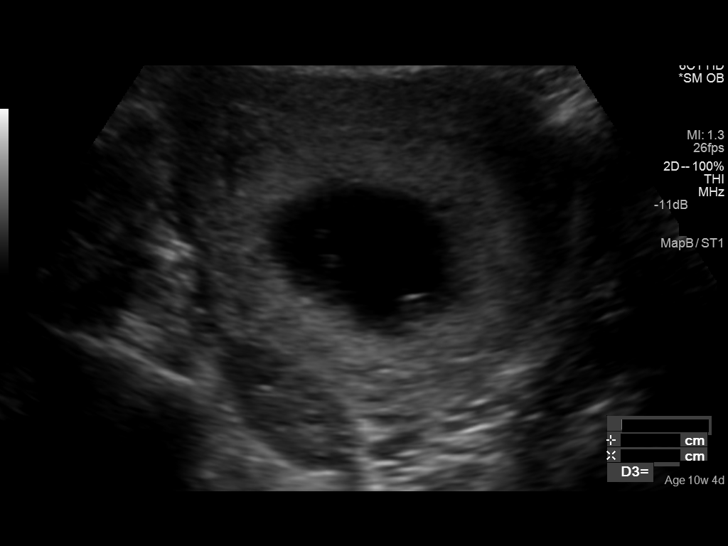
[im 30/63]
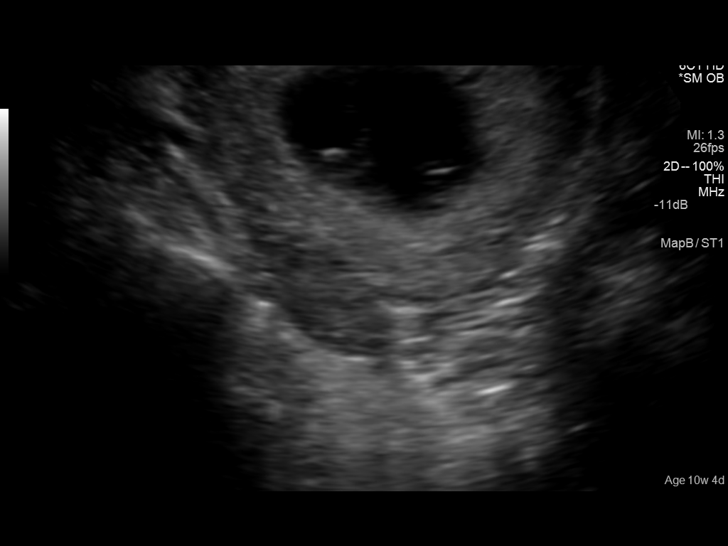
[im 35/63]
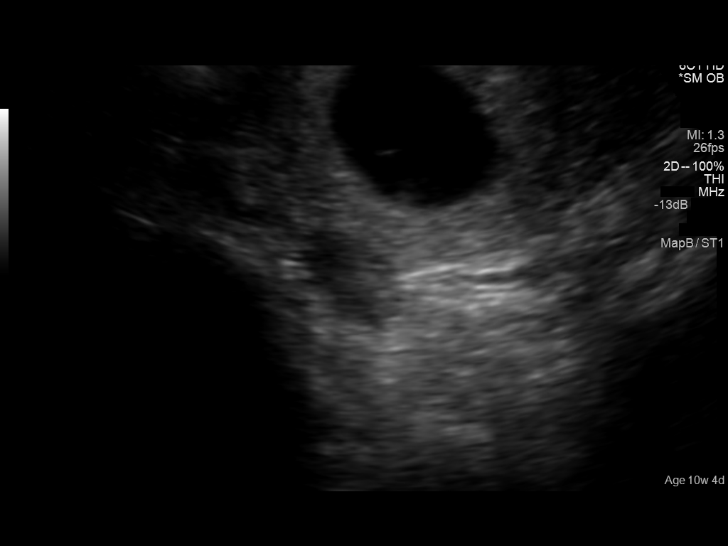
[im 40/63]
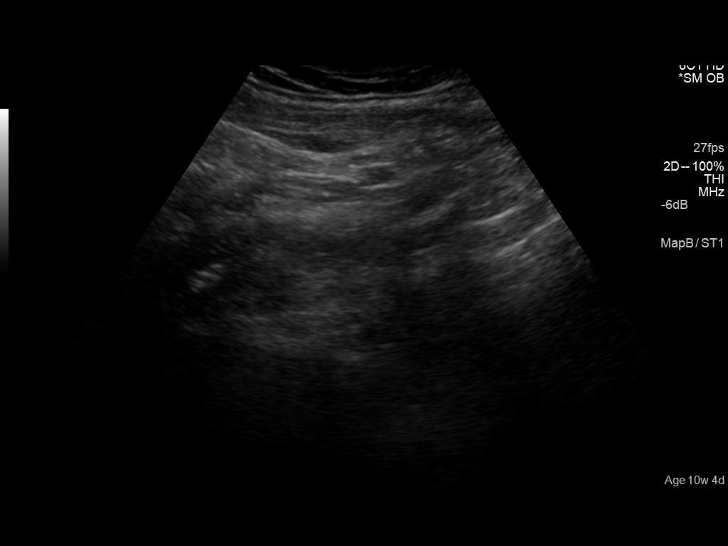
[im 44/63]
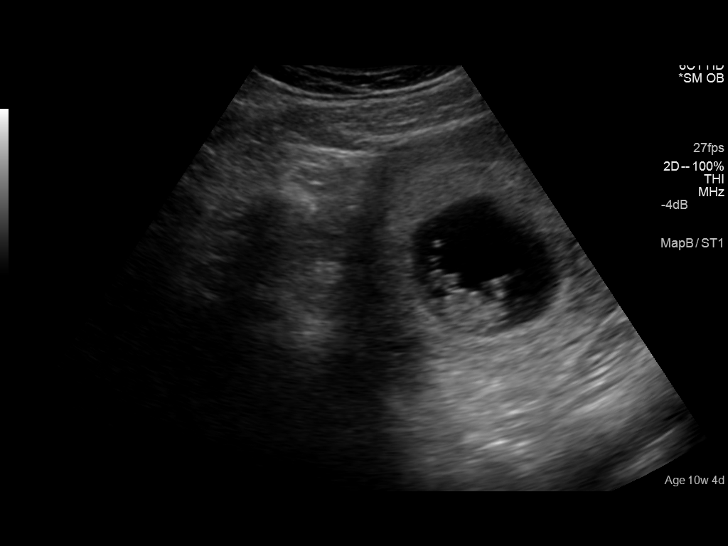
[im 49/63]
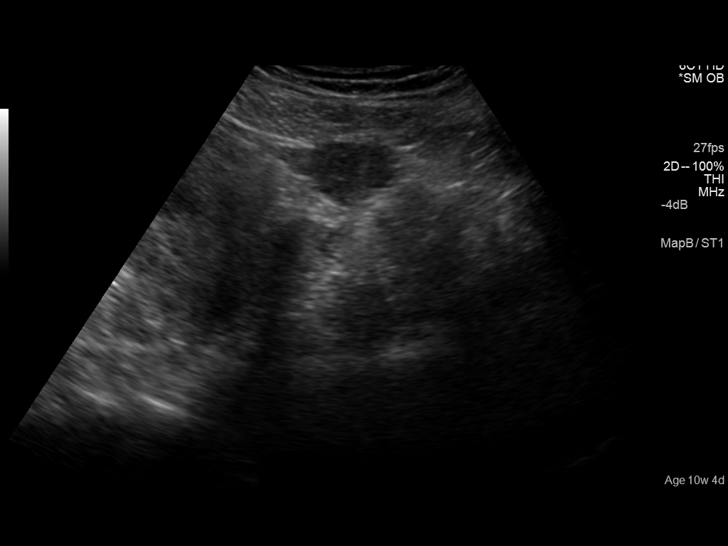
[im 53/63]
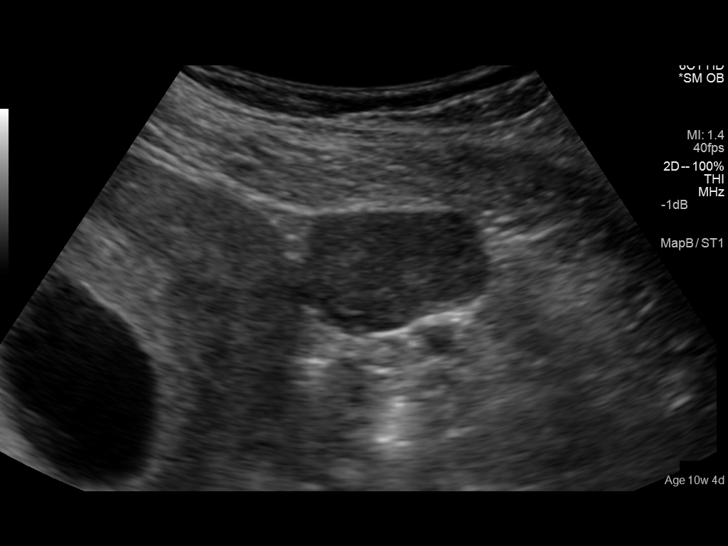
[im 58/63]
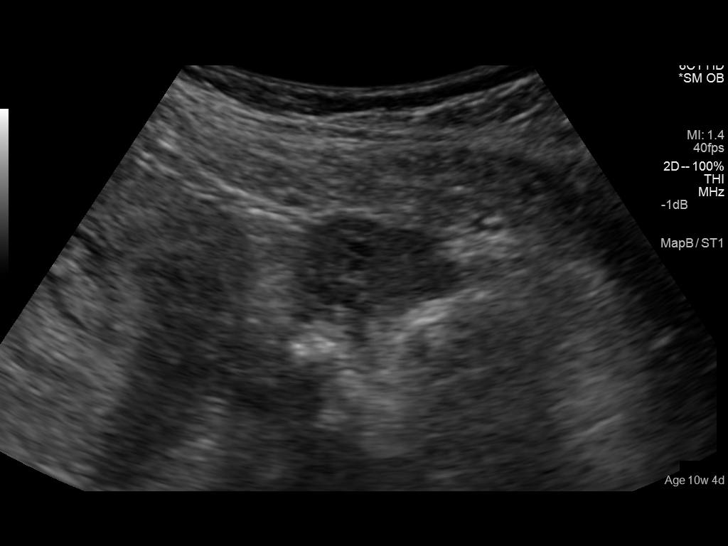
[im 63/63]
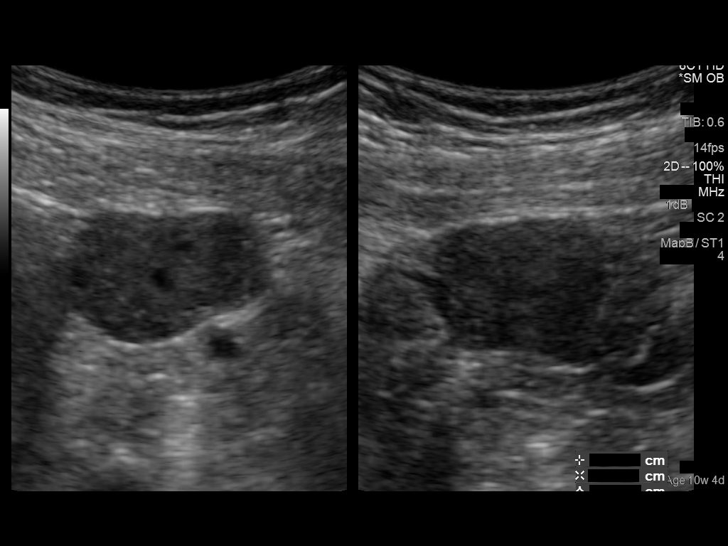

[14 of 28 positions shown; findings below may reference images not displayed]

FINDINGS: Intrauterine gestational sac: Visualized/normal in shape.

Yolk sac:  Yes

Embryo:  Yes

Cardiac Activity: Yes

Heart Rate: 171 bpm

CRL:   38.0  mm   10 w 5 d                  US EDC: 02/26/2015

Maternal uterus/adnexae: No subchronic hemorrhage. No uterine
masses. Small probable left ovarian corpus luteum. Ovaries otherwise
unremarkable. No adnexal masses or pelvic free fluid.
IMPRESSION: 1. Single live intrauterine pregnancy with a measured gestational
age of 10 weeks 5 days, reflecting normal growth from the prior
ultrasound.
2. No emergent pregnancy or maternal finding/complication.

## 2015-10-01 ENCOUNTER — Ambulatory Visit (INDEPENDENT_AMBULATORY_CARE_PROVIDER_SITE_OTHER): Payer: Medicaid Other | Admitting: Obstetrics and Gynecology

## 2015-10-01 ENCOUNTER — Encounter: Payer: Self-pay | Admitting: Obstetrics and Gynecology

## 2015-10-01 ENCOUNTER — Other Ambulatory Visit: Payer: Self-pay | Admitting: Obstetrics and Gynecology

## 2015-10-01 VITALS — BP 117/84 | HR 84 | Ht 63.0 in | Wt 143.8 lb

## 2015-10-01 DIAGNOSIS — N912 Amenorrhea, unspecified: Secondary | ICD-10-CM | POA: Insufficient documentation

## 2015-10-01 DIAGNOSIS — Z803 Family history of malignant neoplasm of breast: Secondary | ICD-10-CM

## 2015-10-01 DIAGNOSIS — E663 Overweight: Secondary | ICD-10-CM | POA: Insufficient documentation

## 2015-10-01 DIAGNOSIS — Z01419 Encounter for gynecological examination (general) (routine) without abnormal findings: Secondary | ICD-10-CM

## 2015-10-01 DIAGNOSIS — Z0001 Encounter for general adult medical examination with abnormal findings: Secondary | ICD-10-CM

## 2015-10-01 DIAGNOSIS — E559 Vitamin D deficiency, unspecified: Secondary | ICD-10-CM | POA: Diagnosis not present

## 2015-10-01 NOTE — Progress Notes (Signed)
   Subjective:     Cassandra Duarte is a 30 y.o. female and is here for a comprehensive physical exam. The patient reports no problems.  Social History   Social History  . Marital status: Single    Spouse name: N/A  . Number of children: N/A  . Years of education: N/A   Occupational History  . Not on file.   Social History Main Topics  . Smoking status: Former Smoker    Packs/day: 0.50    Years: 4.00    Types: Cigarettes    Quit date: 04/02/2014  . Smokeless tobacco: Never Used  . Alcohol use No  . Drug use: No  . Sexual activity: Yes    Birth control/ protection: None, Surgical   Other Topics Concern  . Not on file   Social History Narrative  . No narrative on file   Health Maintenance  Topic Date Due  . HEMOGLOBIN A1C  04/26/1985  . PNEUMOCOCCAL POLYSACCHARIDE VACCINE (1) 01/05/1988  . FOOT EXAM  01/05/1996  . OPHTHALMOLOGY EXAM  01/05/1996  . URINE MICROALBUMIN  01/05/1996  . PAP SMEAR  01/05/2007  . INFLUENZA VACCINE  08/19/2015  . TETANUS/TDAP  03/18/2024  . HIV Screening  Completed    The following portions of the patient's history were reviewed and updated as appropriate: allergies, current medications, past family history, past medical history, past social history, past surgical history and problem list.  Review of Systems A comprehensive review of systems was negative.   Objective:    General appearance: alert, cooperative and appears stated age Neck: no adenopathy, no carotid bruit, no JVD, supple, symmetrical, trachea midline and thyroid not enlarged, symmetric, no tenderness/mass/nodules Lungs: clear to auscultation bilaterally Breasts: normal appearance, no masses or tenderness Heart: regular rate and rhythm, S1, S2 normal, no murmur, click, rub or gallop Abdomen: soft, non-tender; bowel sounds normal; no masses,  no organomegaly Pelvic: cervix normal in appearance, external genitalia normal, no adnexal masses or tenderness, no cervical  motion tenderness, rectovaginal septum normal, uterus normal size, shape, and consistency and vagina normal without discharge    Assessment:    Healthy female exam. Lactational amenorrhea, overweight, family h/o breast cancer- increased personal lifetime risk 21%     Plan:  Pap obtained, desires genetic testing- will return next week for lab draw. Other wise RTC 1 year or prn.  Samuel Rittenhouse Gayla Medicus, CNM   See After Visit Summary for Counseling Recommendations

## 2015-10-01 NOTE — Patient Instructions (Signed)
Preventive Care for Adults, Female A healthy lifestyle and preventive care can promote health and wellness. Preventive health guidelines for women include the following key practices.  A routine yearly physical is a good way to check with your health care provider about your health and preventive screening. It is a chance to share any concerns and updates on your health and to receive a thorough exam.  Visit your dentist for a routine exam and preventive care every 6 months. Brush your teeth twice a day and floss once a day. Good oral hygiene prevents tooth decay and gum disease.  The frequency of eye exams is based on your age, health, family medical history, use of contact lenses, and other factors. Follow your health care provider's recommendations for frequency of eye exams.  Eat a healthy diet. Foods like vegetables, fruits, whole grains, low-fat dairy products, and lean protein foods contain the nutrients you need without too many calories. Decrease your intake of foods high in solid fats, added sugars, and salt. Eat the right amount of calories for you.Get information about a proper diet from your health care provider, if necessary.  Regular physical exercise is one of the most important things you can do for your health. Most adults should get at least 150 minutes of moderate-intensity exercise (any activity that increases your heart rate and causes you to sweat) each week. In addition, most adults need muscle-strengthening exercises on 2 or more days a week.  Maintain a healthy weight. The body mass index (BMI) is a screening tool to identify possible weight problems. It provides an estimate of body fat based on height and weight. Your health care provider can find your BMI and can help you achieve or maintain a healthy weight.For adults 20 years and older:  A BMI below 18.5 is considered underweight.  A BMI of 18.5 to 24.9 is normal.  A BMI of 25 to 29.9 is considered  overweight.  A BMI of 30 and above is considered obese.  Maintain normal blood lipids and cholesterol levels by exercising and minimizing your intake of saturated fat. Eat a balanced diet with plenty of fruit and vegetables. Blood tests for lipids and cholesterol should begin at age 64 and be repeated every 5 years. If your lipid or cholesterol levels are high, you are over 50, or you are at high risk for heart disease, you may need your cholesterol levels checked more frequently.Ongoing high lipid and cholesterol levels should be treated with medicines if diet and exercise are not working.  If you smoke, find out from your health care provider how to quit. If you do not use tobacco, do not start.  Lung cancer screening is recommended for adults aged 52-80 years who are at high risk for developing lung cancer because of a history of smoking. A yearly low-dose CT scan of the lungs is recommended for people who have at least a 30-pack-year history of smoking and are a current smoker or have quit within the past 15 years. A pack year of smoking is smoking an average of 1 pack of cigarettes a day for 1 year (for example: 1 pack a day for 30 years or 2 packs a day for 15 years). Yearly screening should continue until the smoker has stopped smoking for at least 15 years. Yearly screening should be stopped for people who develop a health problem that would prevent them from having lung cancer treatment.  If you are pregnant, do not drink alcohol. If you are  breastfeeding, be very cautious about drinking alcohol. If you are not pregnant and choose to drink alcohol, do not have more than 1 drink per day. One drink is considered to be 12 ounces (355 mL) of beer, 5 ounces (148 mL) of wine, or 1.5 ounces (44 mL) of liquor.  Avoid use of street drugs. Do not share needles with anyone. Ask for help if you need support or instructions about stopping the use of drugs.  High blood pressure causes heart disease and  increases the risk of stroke. Your blood pressure should be checked at least every 1 to 2 years. Ongoing high blood pressure should be treated with medicines if weight loss and exercise do not work.  If you are 25-78 years old, ask your health care provider if you should take aspirin to prevent strokes.  Diabetes screening is done by taking a blood sample to check your blood glucose level after you have not eaten for a certain period of time (fasting). If you are not overweight and you do not have risk factors for diabetes, you should be screened once every 3 years starting at age 86. If you are overweight or obese and you are 3-87 years of age, you should be screened for diabetes every year as part of your cardiovascular risk assessment.  Breast cancer screening is essential preventive care for women. You should practice "breast self-awareness." This means understanding the normal appearance and feel of your breasts and may include breast self-examination. Any changes detected, no matter how small, should be reported to a health care provider. Women in their 66s and 30s should have a clinical breast exam (CBE) by a health care provider as part of a regular health exam every 1 to 3 years. After age 43, women should have a CBE every year. Starting at age 37, women should consider having a mammogram (breast X-ray test) every year. Women who have a family history of breast cancer should talk to their health care provider about genetic screening. Women at a high risk of breast cancer should talk to their health care providers about having an MRI and a mammogram every year.  Breast cancer gene (BRCA)-related cancer risk assessment is recommended for women who have family members with BRCA-related cancers. BRCA-related cancers include breast, ovarian, tubal, and peritoneal cancers. Having family members with these cancers may be associated with an increased risk for harmful changes (mutations) in the breast  cancer genes BRCA1 and BRCA2. Results of the assessment will determine the need for genetic counseling and BRCA1 and BRCA2 testing.  Your health care provider may recommend that you be screened regularly for cancer of the pelvic organs (ovaries, uterus, and vagina). This screening involves a pelvic examination, including checking for microscopic changes to the surface of your cervix (Pap test). You may be encouraged to have this screening done every 3 years, beginning at age 78.  For women ages 79-65, health care providers may recommend pelvic exams and Pap testing every 3 years, or they may recommend the Pap and pelvic exam, combined with testing for human papilloma virus (HPV), every 5 years. Some types of HPV increase your risk of cervical cancer. Testing for HPV may also be done on women of any age with unclear Pap test results.  Other health care providers may not recommend any screening for nonpregnant women who are considered low risk for pelvic cancer and who do not have symptoms. Ask your health care provider if a screening pelvic exam is right for  you.  If you have had past treatment for cervical cancer or a condition that could lead to cancer, you need Pap tests and screening for cancer for at least 20 years after your treatment. If Pap tests have been discontinued, your risk factors (such as having a new sexual partner) need to be reassessed to determine if screening should resume. Some women have medical problems that increase the chance of getting cervical cancer. In these cases, your health care provider may recommend more frequent screening and Pap tests.  Colorectal cancer can be detected and often prevented. Most routine colorectal cancer screening begins at the age of 50 years and continues through age 75 years. However, your health care provider may recommend screening at an earlier age if you have risk factors for colon cancer. On a yearly basis, your health care provider may provide  home test kits to check for hidden blood in the stool. Use of a small camera at the end of a tube, to directly examine the colon (sigmoidoscopy or colonoscopy), can detect the earliest forms of colorectal cancer. Talk to your health care provider about this at age 50, when routine screening begins. Direct exam of the colon should be repeated every 5-10 years through age 75 years, unless early forms of precancerous polyps or small growths are found.  People who are at an increased risk for hepatitis B should be screened for this virus. You are considered at high risk for hepatitis B if:  You were born in a country where hepatitis B occurs often. Talk with your health care provider about which countries are considered high risk.  Your parents were born in a high-risk country and you have not received a shot to protect against hepatitis B (hepatitis B vaccine).  You have HIV or AIDS.  You use needles to inject street drugs.  You live with, or have sex with, someone who has hepatitis B.  You get hemodialysis treatment.  You take certain medicines for conditions like cancer, organ transplantation, and autoimmune conditions.  Hepatitis C blood testing is recommended for all people born from 1945 through 1965 and any individual with known risks for hepatitis C.  Practice safe sex. Use condoms and avoid high-risk sexual practices to reduce the spread of sexually transmitted infections (STIs). STIs include gonorrhea, chlamydia, syphilis, trichomonas, herpes, HPV, and human immunodeficiency virus (HIV). Herpes, HIV, and HPV are viral illnesses that have no cure. They can result in disability, cancer, and death.  You should be screened for sexually transmitted illnesses (STIs) including gonorrhea and chlamydia if:  You are sexually active and are younger than 24 years.  You are older than 24 years and your health care provider tells you that you are at risk for this type of infection.  Your sexual  activity has changed since you were last screened and you are at an increased risk for chlamydia or gonorrhea. Ask your health care provider if you are at risk.  If you are at risk of being infected with HIV, it is recommended that you take a prescription medicine daily to prevent HIV infection. This is called preexposure prophylaxis (PrEP). You are considered at risk if:  You are sexually active and do not regularly use condoms or know the HIV status of your partner(s).  You take drugs by injection.  You are sexually active with a partner who has HIV.  Talk with your health care provider about whether you are at high risk of being infected with HIV. If   you choose to begin PrEP, you should first be tested for HIV. You should then be tested every 3 months for as long as you are taking PrEP.  Osteoporosis is a disease in which the bones lose minerals and strength with aging. This can result in serious bone fractures or breaks. The risk of osteoporosis can be identified using a bone density scan. Women ages 1 years and over and women at risk for fractures or osteoporosis should discuss screening with their health care providers. Ask your health care provider whether you should take a calcium supplement or vitamin D to reduce the rate of osteoporosis.  Menopause can be associated with physical symptoms and risks. Hormone replacement therapy is available to decrease symptoms and risks. You should talk to your health care provider about whether hormone replacement therapy is right for you.  Use sunscreen. Apply sunscreen liberally and repeatedly throughout the day. You should seek shade when your shadow is shorter than you. Protect yourself by wearing long sleeves, pants, a wide-brimmed hat, and sunglasses year round, whenever you are outdoors.  Once a month, do a whole body skin exam, using a mirror to look at the skin on your back. Tell your health care provider of new moles, moles that have irregular  borders, moles that are larger than a pencil eraser, or moles that have changed in shape or color.  Stay current with required vaccines (immunizations).  Influenza vaccine. All adults should be immunized every year.  Tetanus, diphtheria, and acellular pertussis (Td, Tdap) vaccine. Pregnant women should receive 1 dose of Tdap vaccine during each pregnancy. The dose should be obtained regardless of the length of time since the last dose. Immunization is preferred during the 27th-36th week of gestation. An adult who has not previously received Tdap or who does not know her vaccine status should receive 1 dose of Tdap. This initial dose should be followed by tetanus and diphtheria toxoids (Td) booster doses every 10 years. Adults with an unknown or incomplete history of completing a 3-dose immunization series with Td-containing vaccines should begin or complete a primary immunization series including a Tdap dose. Adults should receive a Td booster every 10 years.  Varicella vaccine. An adult without evidence of immunity to varicella should receive 2 doses or a second dose if she has previously received 1 dose. Pregnant females who do not have evidence of immunity should receive the first dose after pregnancy. This first dose should be obtained before leaving the health care facility. The second dose should be obtained 4-8 weeks after the first dose.  Human papillomavirus (HPV) vaccine. Females aged 13-26 years who have not received the vaccine previously should obtain the 3-dose series. The vaccine is not recommended for use in pregnant females. However, pregnancy testing is not needed before receiving a dose. If a female is found to be pregnant after receiving a dose, no treatment is needed. In that case, the remaining doses should be delayed until after the pregnancy. Immunization is recommended for any person with an immunocompromised condition through the age of 24 years if she did not get any or all doses  earlier. During the 3-dose series, the second dose should be obtained 4-8 weeks after the first dose. The third dose should be obtained 24 weeks after the first dose and 16 weeks after the second dose.  Zoster vaccine. One dose is recommended for adults aged 97 years or older unless certain conditions are present.  Measles, mumps, and rubella (MMR) vaccine. Adults born  before 1957 generally are considered immune to measles and mumps. Adults born in [birth year] or later should have 1 or more doses of MMR vaccine unless there is a contraindication to the vaccine or there is laboratory evidence of immunity to each of the three diseases. A routine second dose of MMR vaccine should be obtained at least 28 days after the first dose for students attending postsecondary schools, health care workers, or international travelers. People who received inactivated measles vaccine or an unknown type of measles vaccine during 1963-1967 should receive 2 doses of MMR vaccine. People who received inactivated mumps vaccine or an unknown type of mumps vaccine before 1979 and are at high risk for mumps infection should consider immunization with 2 doses of MMR vaccine. For females of childbearing age, rubella immunity should be determined. If there is no evidence of immunity, females who are not pregnant should be vaccinated. If there is no evidence of immunity, females who are pregnant should delay immunization until after pregnancy. Unvaccinated health care workers born before 60 who lack laboratory evidence of measles, mumps, or rubella immunity or laboratory confirmation of disease should consider measles and mumps immunization with 2 doses of MMR vaccine or rubella immunization with 1 dose of MMR vaccine.  Pneumococcal 13-valent conjugate (PCV13) vaccine. When indicated, a person who is uncertain of his immunization history and has no record of immunization should receive the PCV13 vaccine. All adults 61 years of age and older  should receive this vaccine. An adult aged 92 years or older who has certain medical conditions and has not been previously immunized should receive 1 dose of PCV13 vaccine. This PCV13 should be followed with a dose of pneumococcal polysaccharide (PPSV23) vaccine. Adults who are at high risk for pneumococcal disease should obtain the PPSV23 vaccine at least 8 weeks after the dose of PCV13 vaccine. Adults older than 30 years of age who have normal immune system function should obtain the PPSV23 vaccine dose at least 1 year after the dose of PCV13 vaccine.  Pneumococcal polysaccharide (PPSV23) vaccine. When PCV13 is also indicated, PCV13 should be obtained first. All adults aged 2 years and older should be immunized. An adult younger than age 30 years who has certain medical conditions should be immunized. Any person who resides in a nursing home or long-term care facility should be immunized. An adult smoker should be immunized. People with an immunocompromised condition and certain other conditions should receive both PCV13 and PPSV23 vaccines. People with human immunodeficiency virus (HIV) infection should be immunized as soon as possible after diagnosis. Immunization during chemotherapy or radiation therapy should be avoided. Routine use of PPSV23 vaccine is not recommended for American Indians, [place] Natives, or people younger than 65 years unless there are medical conditions that require PPSV23 vaccine. When indicated, people who have unknown immunization and have no record of immunization should receive PPSV23 vaccine. One-time revaccination 5 years after the first dose of PPSV23 is recommended for people aged 19-64 years who have chronic kidney failure, nephrotic syndrome, asplenia, or immunocompromised conditions. People who received 1-2 doses of PPSV23 before age 44 years should receive another dose of PPSV23 vaccine at age 83 years or later if at least 5 years have passed since the previous dose. Doses  of PPSV23 are not needed for people immunized with PPSV23 at or after age 20 years.  Meningococcal vaccine. Adults with asplenia or persistent complement component deficiencies should receive 2 doses of quadrivalent meningococcal conjugate (MenACWY-D) vaccine. The doses should be obtained  at least 2 months apart. Microbiologists working with certain meningococcal bacteria, [place] recruits, people at risk during an outbreak, and people who travel to or live in countries with a high rate of meningitis should be immunized. A first-year college student up through age 28 years who is living in a residence hall should receive a dose if she did not receive a dose on or after her 16th birthday. Adults who have certain high-risk conditions should receive one or more doses of vaccine.  Hepatitis A vaccine. Adults who wish to be protected from this disease, have certain high-risk conditions, work with hepatitis A-infected animals, work in hepatitis A research labs, or travel to or work in countries with a high rate of hepatitis A should be immunized. Adults who were previously unvaccinated and who anticipate close contact with an international adoptee during the first 60 days after arrival in the [place] from a country with a high rate of hepatitis A should be immunized.  Hepatitis B vaccine. Adults who wish to be protected from this disease, have certain high-risk conditions, may be exposed to blood or other infectious body fluids, are household contacts or sex partners of hepatitis B positive people, are clients or workers in certain care facilities, or travel to or work in countries with a high rate of hepatitis B should be immunized.  Haemophilus influenzae type b (Hib) vaccine. A previously unvaccinated person with asplenia or sickle cell disease or having a scheduled splenectomy should receive 1 dose of Hib vaccine. Regardless of previous immunization, a recipient of a hematopoietic stem cell transplant  should receive a 3-dose series 6-12 months after her successful transplant. Hib vaccine is not recommended for adults with HIV infection. Preventive Services / Frequency Ages 71 to 87 years  Blood pressure check.** / Every 3-5 years.  Lipid and cholesterol check.** / Every 5 years beginning at age 1.  Clinical breast exam.** / Every 3 years for women in their 3s and 31s.  BRCA-related cancer risk assessment.** / For women who have family members with a BRCA-related cancer (breast, ovarian, tubal, or peritoneal cancers).  Pap test.** / Every 2 years from ages 50 through 86. Every 3 years starting at age 87 through age 7 or 75 with a history of 3 consecutive normal Pap tests.  HPV screening.** / Every 3 years from ages 59 through ages 35 to 6 with a history of 3 consecutive normal Pap tests.  Hepatitis C blood test.** / For any individual with known risks for hepatitis C.  Skin self-exam. / Monthly.  Influenza vaccine. / Every year.  Tetanus, diphtheria, and acellular pertussis (Tdap, Td) vaccine.** / Consult your health care provider. Pregnant women should receive 1 dose of Tdap vaccine during each pregnancy. 1 dose of Td every 10 years.  Varicella vaccine.** / Consult your health care provider. Pregnant females who do not have evidence of immunity should receive the first dose after pregnancy.  HPV vaccine. / 3 doses over 6 months, if 72 and younger. The vaccine is not recommended for use in pregnant females. However, pregnancy testing is not needed before receiving a dose.  Measles, mumps, rubella (MMR) vaccine.** / You need at least 1 dose of MMR if you were born in 1957 or later. You may also need a 2nd dose. For females of childbearing age, rubella immunity should be determined. If there is no evidence of immunity, females who are not pregnant should be vaccinated. If there is no evidence of immunity, females who are  pregnant should delay immunization until after  pregnancy.  Pneumococcal 13-valent conjugate (PCV13) vaccine.** / Consult your health care provider.  Pneumococcal polysaccharide (PPSV23) vaccine.** / 1 to 2 doses if you smoke cigarettes or if you have certain conditions.  Meningococcal vaccine.** / 1 dose if you are age 87 to 44 years and a Market researcher living in a residence hall, or have one of several medical conditions, you need to get vaccinated against meningococcal disease. You may also need additional booster doses.  Hepatitis A vaccine.** / Consult your health care provider.  Hepatitis B vaccine.** / Consult your health care provider.  Haemophilus influenzae type b (Hib) vaccine.** / Consult your health care provider. Ages 86 to 38 years  Blood pressure check.** / Every year.  Lipid and cholesterol check.** / Every 5 years beginning at age 49 years.  Lung cancer screening. / Every year if you are aged 71-80 years and have a 30-pack-year history of smoking and currently smoke or have quit within the past 15 years. Yearly screening is stopped once you have quit smoking for at least 15 years or develop a health problem that would prevent you from having lung cancer treatment.  Clinical breast exam.** / Every year after age 51 years.  BRCA-related cancer risk assessment.** / For women who have family members with a BRCA-related cancer (breast, ovarian, tubal, or peritoneal cancers).  Mammogram.** / Every year beginning at age 18 years and continuing for as long as you are in good health. Consult with your health care provider.  Pap test.** / Every 3 years starting at age 63 years through age 37 or 57 years with a history of 3 consecutive normal Pap tests.  HPV screening.** / Every 3 years from ages 41 years through ages 76 to 23 years with a history of 3 consecutive normal Pap tests.  Fecal occult blood test (FOBT) of stool. / Every year beginning at age 36 years and continuing until age 51 years. You may not need  to do this test if you get a colonoscopy every 10 years.  Flexible sigmoidoscopy or colonoscopy.** / Every 5 years for a flexible sigmoidoscopy or every 10 years for a colonoscopy beginning at age 36 years and continuing until age 35 years.  Hepatitis C blood test.** / For all people born from [birth year] through 1965 and any individual with known risks for hepatitis C.  Skin self-exam. / Monthly.  Influenza vaccine. / Every year.  Tetanus, diphtheria, and acellular pertussis (Tdap/Td) vaccine.** / Consult your health care provider. Pregnant women should receive 1 dose of Tdap vaccine during each pregnancy. 1 dose of Td every 10 years.  Varicella vaccine.** / Consult your health care provider. Pregnant females who do not have evidence of immunity should receive the first dose after pregnancy.  Zoster vaccine.** / 1 dose for adults aged 73 years or older.  Measles, mumps, rubella (MMR) vaccine.** / You need at least 1 dose of MMR if you were born in 1957 or later. You may also need a second dose. For females of childbearing age, rubella immunity should be determined. If there is no evidence of immunity, females who are not pregnant should be vaccinated. If there is no evidence of immunity, females who are pregnant should delay immunization until after pregnancy.  Pneumococcal 13-valent conjugate (PCV13) vaccine.** / Consult your health care provider.  Pneumococcal polysaccharide (PPSV23) vaccine.** / 1 to 2 doses if you smoke cigarettes or if you have certain conditions.  Meningococcal vaccine.** /  Consult your health care provider.  Hepatitis A vaccine.** / Consult your health care provider.  Hepatitis B vaccine.** / Consult your health care provider.  Haemophilus influenzae type b (Hib) vaccine.** / Consult your health care provider. Ages 80 years and over  Blood pressure check.** / Every year.  Lipid and cholesterol check.** / Every 5 years beginning at age 62 years.  Lung cancer  screening. / Every year if you are aged 32-80 years and have a 30-pack-year history of smoking and currently smoke or have quit within the past 15 years. Yearly screening is stopped once you have quit smoking for at least 15 years or develop a health problem that would prevent you from having lung cancer treatment.  Clinical breast exam.** / Every year after age 61 years.  BRCA-related cancer risk assessment.** / For women who have family members with a BRCA-related cancer (breast, ovarian, tubal, or peritoneal cancers).  Mammogram.** / Every year beginning at age 39 years and continuing for as long as you are in good health. Consult with your health care provider.  Pap test.** / Every 3 years starting at age 85 years through age 74 or 72 years with 3 consecutive normal Pap tests. Testing can be stopped between 65 and 70 years with 3 consecutive normal Pap tests and no abnormal Pap or HPV tests in the past 10 years.  HPV screening.** / Every 3 years from ages 55 years through ages 67 or 77 years with a history of 3 consecutive normal Pap tests. Testing can be stopped between 65 and 70 years with 3 consecutive normal Pap tests and no abnormal Pap or HPV tests in the past 10 years.  Fecal occult blood test (FOBT) of stool. / Every year beginning at age 81 years and continuing until age 22 years. You may not need to do this test if you get a colonoscopy every 10 years.  Flexible sigmoidoscopy or colonoscopy.** / Every 5 years for a flexible sigmoidoscopy or every 10 years for a colonoscopy beginning at age 67 years and continuing until age 22 years.  Hepatitis C blood test.** / For all people born from [birth year] through 1965 and any individual with known risks for hepatitis C.  Osteoporosis screening.** / A one-time screening for women ages 8 years and over and women at risk for fractures or osteoporosis.  Skin self-exam. / Monthly.  Influenza vaccine. / Every year.  Tetanus, diphtheria, and  acellular pertussis (Tdap/Td) vaccine.** / 1 dose of Td every 10 years.  Varicella vaccine.** / Consult your health care provider.  Zoster vaccine.** / 1 dose for adults aged 56 years or older.  Pneumococcal 13-valent conjugate (PCV13) vaccine.** / Consult your health care provider.  Pneumococcal polysaccharide (PPSV23) vaccine.** / 1 dose for all adults aged 15 years and older.  Meningococcal vaccine.** / Consult your health care provider.  Hepatitis A vaccine.** / Consult your health care provider.  Hepatitis B vaccine.** / Consult your health care provider.  Haemophilus influenzae type b (Hib) vaccine.** / Consult your health care provider. ** Family history and personal history of risk and conditions may change your health care provider's recommendations.   This information is not intended to replace advice given to you by your health care provider. Make sure you discuss any questions you have with your health care provider.   Document Released: 03/02/2001 Document Revised: 01/25/2014 Document Reviewed: 06/01/2010 Elsevier Interactive Patient Education [employer].

## 2015-10-02 LAB — CYTOLOGY - PAP

## 2015-10-23 ENCOUNTER — Encounter: Payer: Self-pay | Admitting: Obstetrics and Gynecology

## 2015-11-20 ENCOUNTER — Other Ambulatory Visit: Payer: Medicaid Other

## 2015-11-21 LAB — CBC
HEMATOCRIT: 40 % (ref 34.0–46.6)
Hemoglobin: 14 g/dL (ref 11.1–15.9)
MCH: 30.5 pg (ref 26.6–33.0)
MCHC: 35 g/dL (ref 31.5–35.7)
MCV: 87 fL (ref 79–97)
PLATELETS: 212 10*3/uL (ref 150–379)
RBC: 4.59 x10E6/uL (ref 3.77–5.28)
RDW: 13.1 % (ref 12.3–15.4)
WBC: 7.6 10*3/uL (ref 3.4–10.8)

## 2015-11-21 LAB — COMPREHENSIVE METABOLIC PANEL
ALK PHOS: 143 IU/L — AB (ref 39–117)
ALT: 41 IU/L — AB (ref 0–32)
AST: 25 IU/L (ref 0–40)
Albumin/Globulin Ratio: 1.7 (ref 1.2–2.2)
Albumin: 4.6 g/dL (ref 3.5–5.5)
BUN/Creatinine Ratio: 25 — ABNORMAL HIGH (ref 9–23)
BUN: 18 mg/dL (ref 6–20)
Bilirubin Total: 0.4 mg/dL (ref 0.0–1.2)
CO2: 23 mmol/L (ref 18–29)
CREATININE: 0.73 mg/dL (ref 0.57–1.00)
Calcium: 9.3 mg/dL (ref 8.7–10.2)
Chloride: 100 mmol/L (ref 96–106)
GFR calc Af Amer: 129 mL/min/{1.73_m2} (ref >59)
GFR calc non Af Amer: 112 mL/min/{1.73_m2} (ref >59)
GLOBULIN, TOTAL: 2.7 g/dL (ref 1.5–4.5)
Glucose: 68 mg/dL (ref 65–99)
POTASSIUM: 4.7 mmol/L (ref 3.5–5.2)
SODIUM: 144 mmol/L (ref 134–144)
Total Protein: 7.3 g/dL (ref 6.0–8.5)

## 2015-11-21 LAB — HEMOGLOBIN A1C
ESTIMATED AVERAGE GLUCOSE: 111 mg/dL
HEMOGLOBIN A1C: 5.5 % (ref 4.8–5.6)

## 2015-11-21 LAB — IRON: Iron: 124 ug/dL (ref 27–159)

## 2015-11-21 LAB — VITAMIN D 25 HYDROXY (VIT D DEFICIENCY, FRACTURES): Vit D, 25-Hydroxy: 25.2 ng/mL — ABNORMAL LOW (ref 30.0–100.0)

## 2015-11-24 ENCOUNTER — Other Ambulatory Visit: Payer: Self-pay | Admitting: Obstetrics and Gynecology

## 2015-11-24 MED ORDER — VITAMIN D (ERGOCALCIFEROL) 1.25 MG (50000 UNIT) PO CAPS: 50000.0000 [IU] | ORAL_CAPSULE | ORAL | 1 refills | Status: DC

## 2016-02-26 ENCOUNTER — Emergency Department
Admission: EM | Admit: 2016-02-26 | Discharge: 2016-02-26 | Disposition: A | Discharge status: Home / Self Care | Payer: Medicaid Other | Attending: Emergency Medicine | Admitting: Emergency Medicine

## 2016-02-26 ENCOUNTER — Encounter: Payer: Self-pay | Admitting: Emergency Medicine

## 2016-02-26 DIAGNOSIS — Z87891 Personal history of nicotine dependence: Secondary | ICD-10-CM | POA: Diagnosis not present

## 2016-02-26 DIAGNOSIS — J111 Influenza due to unidentified influenza virus with other respiratory manifestations: Secondary | ICD-10-CM

## 2016-02-26 DIAGNOSIS — N61 Mastitis without abscess: Secondary | ICD-10-CM | POA: Insufficient documentation

## 2016-02-26 DIAGNOSIS — R69 Illness, unspecified: Secondary | ICD-10-CM

## 2016-02-26 DIAGNOSIS — N644 Mastodynia: Secondary | ICD-10-CM | POA: Diagnosis present

## 2016-02-26 LAB — URINALYSIS, COMPLETE (UACMP) WITH MICROSCOPIC
BACTERIA UA: NONE SEEN
Bilirubin Urine: NEGATIVE
Glucose, UA: NEGATIVE mg/dL
HGB URINE DIPSTICK: NEGATIVE
KETONES UR: NEGATIVE mg/dL
LEUKOCYTES UA: NEGATIVE
NITRITE: NEGATIVE
PROTEIN: NEGATIVE mg/dL
Specific Gravity, Urine: 1.031 — ABNORMAL HIGH (ref 1.005–1.030)
pH: 5 (ref 5.0–8.0)

## 2016-02-26 LAB — CBC WITH DIFFERENTIAL/PLATELET
BASOS ABS: 0 10*3/uL (ref 0–0.1)
BASOS PCT: 1 %
Eosinophils Absolute: 0.1 10*3/uL (ref 0–0.7)
Eosinophils Relative: 2 %
HEMATOCRIT: 35.9 % (ref 35.0–47.0)
HEMOGLOBIN: 12.5 g/dL (ref 12.0–16.0)
LYMPHS PCT: 46 %
Lymphs Abs: 2.2 10*3/uL (ref 1.0–3.6)
MCH: 30.3 pg (ref 26.0–34.0)
MCHC: 34.6 g/dL (ref 32.0–36.0)
MCV: 87.3 fL (ref 80.0–100.0)
MONO ABS: 0.7 10*3/uL (ref 0.2–0.9)
Monocytes Relative: 16 %
NEUTROS ABS: 1.6 10*3/uL (ref 1.4–6.5)
NEUTROS PCT: 35 %
Platelets: 126 10*3/uL — ABNORMAL LOW (ref 150–440)
RBC: 4.12 MIL/uL (ref 3.80–5.20)
RDW: 12.9 % (ref 11.5–14.5)
WBC: 4.7 10*3/uL (ref 3.6–11.0)

## 2016-02-26 LAB — COMPREHENSIVE METABOLIC PANEL
ALBUMIN: 4.2 g/dL (ref 3.5–5.0)
ALK PHOS: 128 U/L — AB (ref 38–126)
ALT: 67 U/L — ABNORMAL HIGH (ref 14–54)
ANION GAP: 8 (ref 5–15)
AST: 47 U/L — ABNORMAL HIGH (ref 15–41)
BUN: 17 mg/dL (ref 6–20)
CALCIUM: 8.6 mg/dL — AB (ref 8.9–10.3)
CO2: 23 mmol/L (ref 22–32)
Chloride: 105 mmol/L (ref 101–111)
Creatinine, Ser: 0.63 mg/dL (ref 0.44–1.00)
GFR calc non Af Amer: >60 mL/min (ref >60)
GLUCOSE: 100 mg/dL — AB (ref 65–99)
POTASSIUM: 3.6 mmol/L (ref 3.5–5.1)
SODIUM: 136 mmol/L (ref 135–145)
Total Bilirubin: 1 mg/dL (ref 0.3–1.2)
Total Protein: 7.3 g/dL (ref 6.5–8.1)

## 2016-02-26 MED ORDER — AMOXICILLIN 500 MG PO CAPS: 500.0000 mg | ORAL_CAPSULE | Freq: Three times a day (TID) | ORAL | 0 refills | Status: DC

## 2016-02-26 MED ORDER — AMOXICILLIN 500 MG PO CAPS
500.0000 mg | ORAL_CAPSULE | Freq: Once | ORAL | Status: AC
  Administered 2016-02-26: 500 mg via ORAL
  Filled 2016-02-26: qty 1

## 2016-02-26 MED ORDER — KETOROLAC TROMETHAMINE 60 MG/2ML IM SOLN
60.0000 mg | Freq: Once | INTRAMUSCULAR | Status: AC
  Administered 2016-02-26: 60 mg via INTRAMUSCULAR
  Filled 2016-02-26: qty 2

## 2016-02-26 NOTE — ED Provider Notes (Signed)
[facility] Emergency Department Provider Note   ____________________________________________   First MD Initiated Contact with Patient 02/26/16 [phone]     (approximate)  I have reviewed the triage vital signs and the nursing notes.   HISTORY  Chief Complaint Generalized Body Aches; Chills; and Breast Pain    HPI Cassandra Duarte is a 31 y.o. female who presents to the ED from home with a chief complaint of flulike symptoms and left breast pain. Patient reports a 4 day history of low-grade fever, chills, headache, congestion and body aches.Patient continues to breast-feed her 36-year-old son, and complains of left breast pain. States her left breast continues to feel engorged even after feeding, the area is tender and warm to the touch. Denies associated chest pain, shortness of breath, abdominal pain, nausea, vomiting, dysuria, diarrhea. Denies recent travel or trauma. Tylenol and Motrin have not helped her symptoms.   Past Medical History:  Diagnosis Date  . Vaginal Pap smear, abnormal 2012   REPEAT AND WAS NORMAL    Patient Active Problem List   Diagnosis Date Noted  . Overweight 10/01/2015  . Lactational amenorrhea 10/01/2015  . Family history of breast cancer 10/01/2015    Past Surgical History:  Procedure Laterality Date  . DILATION AND CURETTAGE OF UTERUS  2014   [facility]  . TUBAL LIGATION Bilateral 02/21/2015   Procedure: BILATERAL TUBAL LIGATION;  Surgeon: Brayton Mars, MD;  Location: [facility] ORS;  Service: Gynecology;  Laterality: Bilateral;    Prior to Admission medications   Medication Sig Start Date End Date Taking? Authorizing Provider  hydrOXYzine (ATARAX/VISTARIL) 10 MG tablet Take 2 tablets by mouth at bedtime as needed. 01/27/16  Yes Historical Provider, MD  Prenatal Vit-Fe Fumarate-FA (PRENATAL MULTIVITAMIN) TABS tablet Take 1 tablet by mouth daily at 12 noon.   Yes Historical Provider, MD  amoxicillin (AMOXIL) 500 MG capsule  Take 1 capsule (500 mg total) by mouth 3 (three) times daily. 02/26/16   Paulette Blanch, MD    Allergies Magnesium-containing compounds and Tramadol  Family History  Problem Relation Age of Onset  . Breast cancer Paternal Aunt   . Diabetes Paternal Aunt   . Diabetes Maternal Grandfather   . COPD Maternal Grandmother   . Cancer Maternal Grandmother     lung  . Colon cancer Neg Hx   . Ovarian cancer Neg Hx   . Heart disease Neg Hx     Social History Social History  Substance Use Topics  . Smoking status: Former Smoker    Packs/day: 0.50    Years: 4.00    Types: Cigarettes    Quit date: 04/02/2014  . Smokeless tobacco: Never Used  . Alcohol use No    Review of Systems  Constitutional: Positive for low-grade fever/chills and body aches. Eyes: No visual changes. ENT: As a different congestion. No sore throat. Cardiovascular: Denies chest pain. Respiratory: Denies shortness of breath. Gastrointestinal: No abdominal pain.  No nausea, no vomiting.  No diarrhea.  No constipation. Genitourinary: Negative for dysuria. Musculoskeletal: Negative for back pain. Skin: Positive for left breast pain, warmth and redness. Negative for rash. Neurological: Negative for headaches, focal weakness or numbness.  10-point ROS otherwise negative.  ____________________________________________   PHYSICAL EXAM:  VITAL SIGNS: ED Triage Vitals  Enc Vitals Group     BP 02/26/16 0216 125/74     Pulse Rate 02/26/16 0216 (!) 114     Resp 02/26/16 0216 18     Temp 02/26/16 0216 98.9  F (37.2 C)     Temp Source 02/26/16 0216 Oral     SpO2 02/26/16 0216 96 %     Weight 02/26/16 0217 140 lb (63.5 kg)     Height 02/26/16 0217 5\' 3"  (1.6 m)     Head Circumference --      Peak Flow --      Pain Score 02/26/16 0217 9     Pain Loc --      Pain Edu? --      Excl. in [place]? --     Constitutional: Alert and oriented. Well appearing and in no acute distress. Eyes: Conjunctivae are normal. PERRL.  EOMI. Head: Atraumatic. Nose: Congestion/rhinnorhea. Mouth/Throat: Mucous membranes are moist. Postnasal drip. Oropharynx non-erythematous. Neck: No stridor.  Supple neck without meningismus. Hematological/Lymphatic/Immunilogical: No cervical lymphadenopathy. Cardiovascular: Normal rate, regular rhythm. Grossly normal heart sounds.  Good peripheral circulation. Respiratory: Normal respiratory effort.  No retractions. Lungs CTAB. Gastrointestinal: Soft and nontender. No distention. No abdominal bruits. No CVA tenderness. Musculoskeletal: No lower extremity tenderness nor edema.  No joint effusions. Neurologic:  Normal speech and language. No gross focal neurologic deficits are appreciated. No gait instability. Skin:  Skin is warm, dry and intact. No rash noted. No petechiae. Left breast: Engorged veins, warm to touch, tender. No area of fluctuance. Psychiatric: Mood and affect are normal. Speech and behavior are normal.  ____________________________________________   LABS (all labs ordered are listed, but only abnormal results are displayed)  Labs Reviewed  COMPREHENSIVE METABOLIC PANEL - Abnormal; Notable for the following:       Result Value   Glucose, Bld 100 (*)    Calcium 8.6 (*)    AST 47 (*)    ALT 67 (*)    Alkaline Phosphatase 128 (*)    All other components within normal limits  CBC WITH DIFFERENTIAL/PLATELET - Abnormal; Notable for the following:    Platelets 126 (*)    All other components within normal limits  URINALYSIS, COMPLETE (UACMP) WITH MICROSCOPIC - Abnormal; Notable for the following:    Color, Urine YELLOW (*)    APPearance CLEAR (*)    Specific Gravity, Urine 1.031 (*)    Squamous Epithelial / LPF 0-5 (*)    All other components within normal limits   ____________________________________________  EKG  None ____________________________________________  RADIOLOGY  None ____________________________________________   PROCEDURES  Procedure(s)  performed: None  Procedures  Critical Care performed: No  ____________________________________________   INITIAL IMPRESSION / ASSESSMENT AND PLAN / ED COURSE  Pertinent labs & imaging results that were available during my care of the patient were reviewed by me and considered in my medical decision making (see chart for details).  31 year old female who presents with a 4 day history of flulike symptoms and left mastitis. She is in no acute distress and breast-feeding her son. Will place on amoxicillin for mastitis; patient declines IV fluids in the ED. Will administer IM Toradol, advised patient to increase her hydration and to follow-up with her PCP next week. Strict return precautions given. Patient verbalizes understanding and agrees with plan of care.      ____________________________________________   FINAL CLINICAL IMPRESSION(S) / ED DIAGNOSES  Final diagnoses:  Mastitis  Influenza-like illness      NEW MEDICATIONS STARTED DURING THIS VISIT:  New Prescriptions   AMOXICILLIN (AMOXIL) 500 MG CAPSULE    Take 1 capsule (500 mg total) by mouth 3 (three) times daily.     Note:  This document was prepared using Dragon  voice recognition software and may include unintentional dictation errors.    Paulette Blanch, MD 02/26/16 [phone]

## 2016-02-26 NOTE — Discharge Instructions (Signed)
1. Take antibiotic as prescribed (amoxicillin 500 mg 3 times daily 7 days).  2. Drink plenty of fluids daily. 3. You may alternate Tylenol and ibuprofen every 3-4 hours as needed for body aches. 4. Return to the ER for worsening symptoms, persistent vomiting, difficulty breathing or other concerns.

## 2016-02-26 NOTE — ED Triage Notes (Signed)
Pt to triage via wheelchair, reports fever, chills, and body aches since Monday, reports left breast pain as well, reports was engorged about a week ago and thinks it may be related, states still feeding from breast.  Pt reports HA as well.  Pt NAD at this time.

## 2016-08-02 ENCOUNTER — Ambulatory Visit: Payer: Medicaid Other | Admitting: Family Medicine

## 2016-08-15 ENCOUNTER — Emergency Department
Admission: EM | Admit: 2016-08-15 | Discharge: 2016-08-15 | Disposition: A | Discharge status: Home / Self Care | Payer: Medicaid Other

## 2016-10-01 ENCOUNTER — Encounter: Payer: Medicaid Other | Admitting: Obstetrics and Gynecology

## 2016-12-08 ENCOUNTER — Ambulatory Visit: Payer: Medicaid Other | Admitting: Family Medicine

## 2016-12-28 ENCOUNTER — Other Ambulatory Visit: Payer: Self-pay | Admitting: Obstetrics and Gynecology

## 2016-12-28 ENCOUNTER — Ambulatory Visit (INDEPENDENT_AMBULATORY_CARE_PROVIDER_SITE_OTHER): Payer: Medicaid Other | Admitting: Obstetrics and Gynecology

## 2016-12-28 ENCOUNTER — Encounter: Payer: Self-pay | Admitting: Obstetrics and Gynecology

## 2016-12-28 VITALS — BP 105/75 | HR 73 | Ht 63.0 in | Wt 157.5 lb

## 2016-12-28 DIAGNOSIS — R102 Pelvic and perineal pain: Secondary | ICD-10-CM

## 2016-12-28 NOTE — Progress Notes (Signed)
HPI:      [name] is a 31 y.o. [id number] who LMP was Patient's last menstrual period was 11/24/2016 (exact date).  Subjective:   She presents today stating that her menses is on time this month but it is only spotting instead of a "normal" period.  She also thinks that 1 of her home pregnancy test was positive.  She had a tubal ligation postpartum approximately 2 years ago. She also complains of some left-sided pelvic discomfort and back pain.  It is not disabling and she has been able to work but it is intermittently painful. She is concerned because she thinks all of her pregnancies began exactly like this and wants to be "absolutely sure" that she is not pregnant.    Hx: The following portions of the patient's history were reviewed and updated as appropriate:             She  has a past medical history of Vaginal Pap smear, abnormal (2012). She does not have any pertinent problems on file. She  has a past surgical history that includes Dilation and curettage of uterus (2014) and Tubal ligation (Bilateral, 02/21/2015). Her family history includes Breast cancer in her paternal aunt; COPD in her maternal grandmother; Cancer in her maternal grandmother; Diabetes in her maternal grandfather and paternal aunt. She  reports that she quit smoking about 2 years ago. Her smoking use included cigarettes. She has a 2.00 pack-year smoking history. she has never used smokeless tobacco. She reports that she does not drink alcohol or use drugs. She is allergic to magnesium-containing compounds and tramadol.       Review of Systems:  Review of Systems  Constitutional: Denied constitutional symptoms, night sweats, recent illness, fatigue, fever, insomnia and weight loss.  Eyes: Denied eye symptoms, eye pain, photophobia, vision change and visual disturbance.  Ears/Nose/Throat/Neck: Denied ear, nose, throat or neck symptoms, hearing loss, nasal discharge, sinus congestion and sore throat.   Cardiovascular: Denied cardiovascular symptoms, arrhythmia, chest pain/pressure, edema, exercise intolerance, orthopnea and palpitations.  Respiratory: Denied pulmonary symptoms, asthma, pleuritic pain, productive sputum, cough, dyspnea and wheezing.  Gastrointestinal: Denied, gastro-esophageal reflux, melena, nausea and vomiting.  Genitourinary: See HPI for additional information.  Musculoskeletal: Denied musculoskeletal symptoms, stiffness, swelling, muscle weakness and myalgia.  Dermatologic: Denied dermatology symptoms, rash and scar.  Neurologic: Denied neurology symptoms, dizziness, headache, neck pain and syncope.  Psychiatric: Denied psychiatric symptoms, anxiety and depression.  Endocrine: Denied endocrine symptoms including hot flashes and night sweats.   Meds:   No current outpatient medications on file prior to visit.   No current facility-administered medications on file prior to visit.     Objective:     Vitals:   12/28/16 1423  BP: 105/75  Pulse: 73              Urinary beta-hCG negative  Assessment:    [id number] Patient Active Problem List   Diagnosis Date Noted  . Overweight 10/01/2015  . Lactational amenorrhea 10/01/2015  . Family history of breast cancer 10/01/2015     1. Pelvic pain in female     Possibly ovarian cyst delaying menses and causing left-sided pelvic pain.  Not disabling.   Plan:            1.  Serum beta hCG. -Expect negative result.  2.  If pain worsens consider ultrasound for identification and characterization of possible left ovarian cyst.  Patient to contact us if this happens.  Orders Orders Placed This  Encounter  Procedures  . B-HCG Quant    No orders of the defined types were placed in this encounter.     F/U  Return for Pt to contact us if symptoms worsen. I spent 17 minutes with this patient of which greater than 50% was spent discussing ovarian cyst, irregular menses, tubal ligation, future risk of pregnancy,  workup for cysts.  Finis Bud, M.D. 12/28/2016 2:39 PM

## 2016-12-28 NOTE — Addendum Note (Signed)
Addended by: Raliegh Ip on: 12/28/2016 04:45 PM   Modules accepted: Orders

## 2016-12-29 ENCOUNTER — Telehealth: Payer: Self-pay | Admitting: Obstetrics and Gynecology

## 2016-12-29 LAB — BETA HCG QUANT (REF LAB)

## 2016-12-29 NOTE — Telephone Encounter (Signed)
Lab results given to pt.

## 2016-12-29 NOTE — Telephone Encounter (Signed)
The patient called to check on her lab results. No other information was disclosed. Please advise.

## 2017-01-07 ENCOUNTER — Ambulatory Visit: Payer: Medicaid Other | Admitting: Family Medicine

## 2017-01-24 ENCOUNTER — Encounter: Payer: Self-pay | Admitting: Family Medicine

## 2017-01-24 ENCOUNTER — Ambulatory Visit: Payer: Medicaid Other | Admitting: Family Medicine

## 2017-01-24 VITALS — BP 94/61 | HR 96 | Temp 98.1°F | Wt 157.4 lb

## 2017-01-24 DIAGNOSIS — G8929 Other chronic pain: Secondary | ICD-10-CM | POA: Diagnosis not present

## 2017-01-24 DIAGNOSIS — M5441 Lumbago with sciatica, right side: Secondary | ICD-10-CM | POA: Diagnosis not present

## 2017-01-24 MED ORDER — LIDOCAINE 5 % EX PTCH: 1.0000 | MEDICATED_PATCH | CUTANEOUS | 0 refills | Status: DC

## 2017-01-24 MED ORDER — HYDROCODONE-ACETAMINOPHEN 5-325 MG PO TABS: 1.0000 | ORAL_TABLET | Freq: Three times a day (TID) | ORAL | 0 refills | Status: DC | PRN

## 2017-01-24 MED ORDER — SUCRALFATE 1 G PO TABS: 1.0000 g | ORAL_TABLET | Freq: Three times a day (TID) | ORAL | 0 refills | Status: DC

## 2017-01-24 NOTE — Progress Notes (Signed)
BP 94/61 (BP Location: Right Arm, Patient Position: Sitting, Cuff Size: Normal)   Pulse 96   Temp 98.1 F (36.7 C) (Oral)   Wt 157 lb 6.4 oz (71.4 kg)   SpO2 95%   BMI 27.88 kg/m    Subjective:    Patient ID: Cassandra Duarte, female    DOB: [birth year], 32 y.o.   MRN: [id number]  HPI: Cassandra Duarte is a 32 y.o. female  Chief Complaint  Patient presents with  . Back Pain    More on right side. Buldging disc at 17. Got worse when she had her son. Wanting referral to specialist. Radiates to neck and leg.   Pt here with significant right low back pain and neck pain. Right leg painful and numb down to toes. Bulging disc in lumbar area since age17. Did PT with no benefit, chiropractic, and many different medications. Saw neurosurgeon around that time who did not want to operate d/t her age.  Was taking about 2 600 mg ibuprofen daily but started having stomach issues. Muscle relaxers haven't helped much, has some at home. Denies fevers, chills, bowel or bladder incontinence, saddle paresthesias.   *Still breastfeeding son, but tapering him off and only breastfeeding him once every night to every other night.   Relevant past medical, surgical, family and social history reviewed and updated as indicated. Interim medical history since our last visit reviewed. Allergies and medications reviewed and updated.  Review of Systems  HENT: Negative.   Respiratory: Negative.   Cardiovascular: Negative.   Gastrointestinal: Negative.   Genitourinary: Negative.   Musculoskeletal: Positive for arthralgias, back pain, myalgias, neck pain and neck stiffness.  Neurological: Positive for weakness and numbness.  Psychiatric/Behavioral: Negative.    Per HPI unless specifically indicated above     Objective:    BP 94/61 (BP Location: Right Arm, Patient Position: Sitting, Cuff Size: Normal)   Pulse 96   Temp 98.1 F (36.7 C) (Oral)   Wt 157 lb 6.4 oz (71.4 kg)   SpO2 95%   BMI 27.88  kg/m   Wt Readings from Last 3 Encounters:  01/24/17 157 lb 6.4 oz (71.4 kg)  12/28/16 157 lb 8 oz (71.4 kg)  02/26/16 140 lb (63.5 kg)    Physical Exam  Constitutional: She is oriented to person, place, and time. She appears well-developed and well-nourished. No distress.  HENT:  Head: Atraumatic.  Eyes: Conjunctivae are normal. Pupils are equal, round, and reactive to light.  Neck: Neck supple.  ROM decreased, particularly on right side of neck  Cardiovascular: Normal rate and normal heart sounds.  Pulmonary/Chest: Effort normal and breath sounds normal. No respiratory distress.  Musculoskeletal:  Mildly antalgic ROM Significant trapezius spasm, right B/l lumbar paraspinal ttp, worst on right - SLR  Neurological: She is alert and oriented to person, place, and time. Coordination normal.  Skin: Skin is warm and dry.  Psychiatric: She has a normal mood and affect. Her behavior is normal.  Nursing note and vitals reviewed.   Results for orders placed or performed in visit on 12/28/16  Beta HCG, Quant  Result Value Ref Range   hCG Quant <1 mIU/mL      Assessment & Plan:   Problem List Items Addressed This Visit    None    Visit Diagnoses    Chronic right-sided low back pain with right-sided sciatica    -  Primary   Referral placed to Neurosurgery for re-eval as issues are chronic, worsening. Flexeril, tylenol,  soaks in meantime. Small supply of hydrocodone given    Relevant Medications   HYDROcodone-acetaminophen (NORCO/VICODIN) 5-325 MG tablet   Other Relevant Orders   Ambulatory referral to Neurosurgery    Pt aware of sedation risks with flexeril and hydrocodone, as well as to not breastfeed within 8 hours of taking them.   Follow up plan: Return if symptoms worsen or fail to improve.

## 2017-01-25 ENCOUNTER — Telehealth: Payer: Self-pay

## 2017-01-25 NOTE — Telephone Encounter (Signed)
Pt told me in appt that she had some at home and they caused lots of side effects so she was not interested in getting any more. I am happy to send some over if she's changed her mind, but that was my understanding of our conversation

## 2017-01-25 NOTE — Telephone Encounter (Signed)
Copied from [place]. Topic: General - Other >> Jan 24, 2017  2:59 PM Yvette Rack wrote: Reason for CRM: patient calling stating that the provider was suppose to have sent in a muscle relaxer to the [facility] on [address]

## 2017-01-25 NOTE — Telephone Encounter (Signed)
Cassandra Duarte, Patient stating muscle relaxer was supposed to be sent in to her pharmacy. It's not in her current med list, so if there was I don't think it went through.

## 2017-01-25 NOTE — Telephone Encounter (Signed)
Tried calling patient back. No answer and mailbox was full. Will try again later.

## 2017-01-26 MED ORDER — CYCLOBENZAPRINE HCL 10 MG PO TABS: 10.0000 mg | ORAL_TABLET | Freq: Three times a day (TID) | ORAL | 0 refills | Status: DC | PRN

## 2017-01-26 NOTE — Telephone Encounter (Signed)
Spoke with patient. Please send muscle relaxer to pharmacy. Patient said she must have misunderstood and that's why you sent in the nausea medicine. But patient states her back is hurting and she would like to try the muscle relaxer again.

## 2017-01-26 NOTE — Telephone Encounter (Signed)
Rx sent 

## 2017-01-27 NOTE — Patient Instructions (Signed)
Follow up as needed

## 2017-02-02 ENCOUNTER — Telehealth: Payer: Self-pay | Admitting: Family Medicine

## 2017-02-02 ENCOUNTER — Encounter: Payer: Self-pay | Admitting: Family Medicine

## 2017-02-02 ENCOUNTER — Ambulatory Visit (INDEPENDENT_AMBULATORY_CARE_PROVIDER_SITE_OTHER): Payer: Medicaid Other | Admitting: Family Medicine

## 2017-02-02 VITALS — BP 124/73 | HR 95 | Temp 98.0°F | Resp 17 | Wt 159.4 lb

## 2017-02-02 DIAGNOSIS — J111 Influenza due to unidentified influenza virus with other respiratory manifestations: Secondary | ICD-10-CM | POA: Diagnosis not present

## 2017-02-02 DIAGNOSIS — G8929 Other chronic pain: Secondary | ICD-10-CM | POA: Diagnosis not present

## 2017-02-02 DIAGNOSIS — M5441 Lumbago with sciatica, right side: Secondary | ICD-10-CM | POA: Diagnosis not present

## 2017-02-02 LAB — VERITOR FLU A/B WAIVED
INFLUENZA B: NEGATIVE
Influenza A: NEGATIVE

## 2017-02-02 MED ORDER — METHOCARBAMOL 500 MG PO TABS: 500.0000 mg | ORAL_TABLET | Freq: Four times a day (QID) | ORAL | 0 refills | Status: DC

## 2017-02-02 MED ORDER — OSELTAMIVIR PHOSPHATE 75 MG PO CAPS: 75.0000 mg | ORAL_CAPSULE | Freq: Two times a day (BID) | ORAL | 0 refills | Status: DC

## 2017-02-02 MED ORDER — HYDROCOD POLST-CPM POLST ER 10-8 MG/5ML PO SUER: 5.0000 mL | Freq: Two times a day (BID) | ORAL | 0 refills | Status: DC | PRN

## 2017-02-02 MED ORDER — HYDROCODONE-HOMATROPINE 5-1.5 MG/5ML PO SYRP: 5.0000 mL | ORAL_SOLUTION | Freq: Three times a day (TID) | ORAL | 0 refills | Status: DC | PRN

## 2017-02-02 MED ORDER — ONDANSETRON 4 MG PO TBDP: 4.0000 mg | ORAL_TABLET | Freq: Three times a day (TID) | ORAL | 0 refills | Status: DC | PRN

## 2017-02-02 NOTE — Telephone Encounter (Signed)
Will print the one requested and she can bring back the old one and pick up the new one
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
## 2017-02-02 NOTE — Telephone Encounter (Signed)
CRM for notification. See Telephone encounter for:   02/02/17.    Relation to pt: self Call back number: [phone] Pharmacy: [facility], [place]  Reason for call:  Patient was seen today and  states chlorpheniramine-HYDROcodone (TUSSIONEX PENNKINETIC ER) 10-8 MG/5ML SURE is to expensive and was prescribed in the past by urgent care "hydromet syrup 60mil a day by mouth every 6 hrs if needed / 120 quantity", patient states hydromet cost $10.   Patient states she will bring  chlorpheniramine-HYDROcodone (TUSSIONEX PENNKINETIC ER) 10-8 MG/5ML SURE rX back into office and would like alternate Rx today, please advise

## 2017-02-02 NOTE — Telephone Encounter (Signed)
Called pt. Home number, to inform them to stop by office or call back in regards to medication. No answer due to VM box being full.

## 2017-02-05 ENCOUNTER — Encounter: Payer: Self-pay | Admitting: Family Medicine

## 2017-02-05 NOTE — Assessment & Plan Note (Signed)
Will try sending over a different muscle relaxer to replace the flexeril, but explained to pt we do not replace lost controlled substances. Pt understands. PT referral placed to discuss dry needling or other non-surgical options for her chronic back pain while she waits to get into neurosurgery

## 2017-02-05 NOTE — Patient Instructions (Signed)
Follow up as needed

## 2017-02-10 DIAGNOSIS — M5441 Lumbago with sciatica, right side: Secondary | ICD-10-CM | POA: Diagnosis not present

## 2017-02-10 DIAGNOSIS — Z6827 Body mass index (BMI) 27.0-27.9, adult: Secondary | ICD-10-CM | POA: Diagnosis not present

## 2017-02-17 ENCOUNTER — Encounter: Payer: Self-pay | Admitting: Family Medicine

## 2017-02-17 ENCOUNTER — Ambulatory Visit: Payer: Medicaid Other | Admitting: Family Medicine

## 2017-02-17 ENCOUNTER — Telehealth: Payer: Self-pay | Admitting: Family Medicine

## 2017-02-17 VITALS — BP 108/67 | HR 88 | Temp 98.1°F | Wt 158.5 lb

## 2017-02-17 DIAGNOSIS — G8929 Other chronic pain: Secondary | ICD-10-CM | POA: Diagnosis not present

## 2017-02-17 DIAGNOSIS — M5441 Lumbago with sciatica, right side: Secondary | ICD-10-CM

## 2017-02-17 MED ORDER — PREDNISONE 10 MG PO TABS: ORAL_TABLET | ORAL | 0 refills | Status: DC

## 2017-02-17 MED ORDER — CYCLOBENZAPRINE HCL 10 MG PO TABS: 10.0000 mg | ORAL_TABLET | Freq: Three times a day (TID) | ORAL | 0 refills | Status: DC | PRN

## 2017-02-17 MED ORDER — OXYCODONE-ACETAMINOPHEN 2.5-325 MG PO TABS: 1.0000 | ORAL_TABLET | Freq: Four times a day (QID) | ORAL | 0 refills | Status: DC | PRN

## 2017-02-17 MED ORDER — OXYCODONE-ACETAMINOPHEN 5-325 MG PO TABS: 1.0000 | ORAL_TABLET | Freq: Four times a day (QID) | ORAL | 0 refills | Status: DC | PRN

## 2017-02-17 MED ORDER — LIDOCAINE 5 % EX PTCH: 1.0000 | MEDICATED_PATCH | CUTANEOUS | 0 refills | Status: DC

## 2017-02-17 NOTE — Telephone Encounter (Signed)
Copied from [place]. Topic: General - Other >> Feb 17, 2017  2:25 PM Lolita Rieger, [place] wrote: Reason for CRM: Carrolyn Meiers from [facility] called and stated that the percocet 2.5 dosage is not available and he would like to know if it could be written for the more common dosage of 5-325 mg  Please contact the pharmacy at [phone]

## 2017-02-17 NOTE — Telephone Encounter (Signed)
Patient notified

## 2017-02-17 NOTE — Progress Notes (Signed)
BP 108/67 (BP Location: Left Arm, Patient Position: Sitting, Cuff Size: Normal)   Pulse 88   Temp 98.1 F (36.7 C) (Oral)   Wt 158 lb 8 oz (71.9 kg)   SpO2 98%   BMI 28.08 kg/m    Subjective:    Patient ID: Cassandra Duarte, female    DOB: Jan 21, 1985, 32 y.o.   MRN: [id number]  HPI: Cassandra Duarte is a 32 y.o. female  Chief Complaint  Patient presents with  . Fall    [place] getting out of bed Wednesday morning. Patient states her right leg gave out. States she was feeling better until she fell. Spine Specialist said 2 weeks ago that patient will probably have to have surgery.  . Back Pain  . Leg Pain   Pt here for right lower back pain with radiation down right leg since falling out of bed yesterday. Fell onto right hip/back when her right leg gave out on her trying to get out of bed. Trying ibuprofen and muscle relaxers with minimal relief. Long hx of back issues with known disc disease. Had consult with Neurosurgery last week. Awaiting an MRI next week, hoping to have surgery in the summer per their recommendation.   Relevant past medical, surgical, family and social history reviewed and updated as indicated. Interim medical history since our last visit reviewed. Allergies and medications reviewed and updated.  Review of Systems  Respiratory: Negative.   Cardiovascular: Negative.   Genitourinary: Negative.   Musculoskeletal: Positive for back pain, gait problem and myalgias.  Neurological: Positive for weakness.  Psychiatric/Behavioral: Negative.    Per HPI unless specifically indicated above     Objective:    BP 108/67 (BP Location: Left Arm, Patient Position: Sitting, Cuff Size: Normal)   Pulse 88   Temp 98.1 F (36.7 C) (Oral)   Wt 158 lb 8 oz (71.9 kg)   SpO2 98%   BMI 28.08 kg/m   Wt Readings from Last 3 Encounters:  02/17/17 158 lb 8 oz (71.9 kg)  02/02/17 159 lb 6.4 oz (72.3 kg)  01/24/17 157 lb 6.4 oz (71.4 kg)    Physical Exam    Constitutional: She is oriented to person, place, and time. She appears well-developed and well-nourished. No distress.  HENT:  Head: Atraumatic.  Eyes: Conjunctivae are normal. Pupils are equal, round, and reactive to light.  Neck: Normal range of motion. Neck supple.  Cardiovascular: Normal rate and normal heart sounds.  Pulmonary/Chest: Effort normal and breath sounds normal. No respiratory distress.  Musculoskeletal: She exhibits no deformity.  Antalgic gait TTP right lower back  Neurological: She is alert and oriented to person, place, and time.  Skin: Skin is warm and dry.  Psychiatric: She has a normal mood and affect. Her behavior is normal.  Nursing note and vitals reviewed.     Assessment & Plan:   Problem List Items Addressed This Visit      Nervous and Auditory   Chronic low back pain with right-sided sciatica - Primary    With acute exacerbation from a fall. Will give small supply of oxycodone as well as start prednisone taper. Continue muscle relaxer prn as well as lidocaine patches. Rest, massage, heat. Await MRI results with Neurosurgery      Relevant Medications   predniSONE (DELTASONE) 10 MG tablet   cyclobenzaprine (FLEXERIL) 10 MG tablet     Sedation precautions reviewed at length with patient with these medications  Follow up plan: Return if symptoms worsen or  fail to improve.

## 2017-02-17 NOTE — Telephone Encounter (Signed)
Rx changed, being faxed now

## 2017-02-17 NOTE — Telephone Encounter (Signed)
Copied from [place] [id number]. Topic: Quick Communication - See Telephone Encounter >> Feb 17, 2017  2:21 PM Arletha Grippe wrote: CRM for notification. See Telephone encounter for:   02/17/17. Pt was given rx for percocet. The pharmacy she took it to says that no one carries it in 2.5 mg. Pharmacist is wondering if it is supposed to be 5 mg. Pharmacist  says that he is not aware of any pharmacy that carries the 2.5mg .  Cb# [phone]

## 2017-02-19 NOTE — Assessment & Plan Note (Signed)
With acute exacerbation from a fall. Will give small supply of oxycodone as well as start prednisone taper. Continue muscle relaxer prn as well as lidocaine patches. Rest, massage, heat. Await MRI results with Neurosurgery

## 2017-02-19 NOTE — Patient Instructions (Signed)
Follow up as needed

## 2017-02-21 ENCOUNTER — Other Ambulatory Visit: Payer: Self-pay | Admitting: Physician Assistant

## 2017-02-21 ENCOUNTER — Other Ambulatory Visit (HOSPITAL_COMMUNITY): Payer: Self-pay | Admitting: Physician Assistant

## 2017-02-21 DIAGNOSIS — M5441 Lumbago with sciatica, right side: Secondary | ICD-10-CM

## 2017-02-23 ENCOUNTER — Emergency Department
Admission: EM | Admit: 2017-02-23 | Discharge: 2017-02-23 | Disposition: A | Discharge status: Home / Self Care | Payer: Medicaid Other | Attending: Emergency Medicine | Admitting: Emergency Medicine

## 2017-02-23 ENCOUNTER — Other Ambulatory Visit: Payer: Self-pay

## 2017-02-23 ENCOUNTER — Encounter: Payer: Self-pay | Admitting: Emergency Medicine

## 2017-02-23 DIAGNOSIS — Z87891 Personal history of nicotine dependence: Secondary | ICD-10-CM | POA: Insufficient documentation

## 2017-02-23 DIAGNOSIS — J111 Influenza due to unidentified influenza virus with other respiratory manifestations: Secondary | ICD-10-CM | POA: Diagnosis not present

## 2017-02-23 DIAGNOSIS — R509 Fever, unspecified: Secondary | ICD-10-CM | POA: Diagnosis present

## 2017-02-23 MED ORDER — OSELTAMIVIR PHOSPHATE 75 MG PO CAPS: 75.0000 mg | ORAL_CAPSULE | Freq: Two times a day (BID) | ORAL | 0 refills | Status: AC

## 2017-02-23 MED ORDER — GUAIFENESIN-CODEINE 100-10 MG/5ML PO SYRP: 5.0000 mL | ORAL_SOLUTION | Freq: Three times a day (TID) | ORAL | 0 refills | Status: AC | PRN

## 2017-02-23 MED ORDER — ALBUTEROL SULFATE HFA 108 (90 BASE) MCG/ACT IN AERS: 2.0000 | INHALATION_SPRAY | Freq: Four times a day (QID) | RESPIRATORY_TRACT | 0 refills | Status: DC | PRN

## 2017-02-23 NOTE — ED Triage Notes (Signed)
Patient complaining of fever - states 104.6 during the night, she states she took 3 200mg . Ibuprofen and a bath and got the temperature to come down.  Complaining of body aches, fever, chills, headache and cough.  Both kids have similar symptoms.

## 2017-02-23 NOTE — ED Notes (Signed)
Patient ambulatory to lobby with steady gait and NAD noted. Patient verbalized understanding of discharge instructions and follow-up care.

## 2017-02-23 NOTE — ED Provider Notes (Signed)
[facility] Emergency Department Provider Note  ____________________________________________  Time seen: Approximately 9:14 AM  I have reviewed the triage vital signs and the nursing notes.   HISTORY  Chief Complaint Fever; Generalized Body Aches; Cough; and Chills    HPI Cassandra Duarte is a 32 y.o. female presents to the emergency department for evaluation of fever, chills, nonproductive cough for 6 hours. She is coughing but does "not feel that cold has moved into chest." She had a fever of 104 during the night.  She took ibuprofen and a cold bath to reduce fever.  Her children were sick with similar symptoms.  She works at a daycare.  No shortness of breath, chest pain, nausea, vomiting, abdominal pain, diarrhea, constipation.   Past Medical History:  Diagnosis Date  . Vaginal Pap smear, abnormal 2012   REPEAT AND WAS NORMAL    Patient Active Problem List   Diagnosis Date Noted  . Overweight 10/01/2015  . Lactational amenorrhea 10/01/2015  . Family history of breast cancer 10/01/2015  . 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 DILATION AND CURETTAGE OF UTERUS  2014   WS  . TUBAL LIGATION Bilateral 02/21/2015   Procedure: BILATERAL TUBAL LIGATION;  Surgeon: Brayton Mars, MD;  Location: [facility] ORS;  Service: Gynecology;  Laterality: Bilateral;    Prior to Admission medications   Medication Sig Start Date End Date Taking? Authorizing Provider  albuterol (PROVENTIL HFA;VENTOLIN HFA) 108 (90 Base) MCG/ACT inhaler Inhale 2 puffs into the lungs every 6 (six) hours as needed for wheezing or shortness of breath. 02/23/17   Laban Emperor, PA-C  cyclobenzaprine (FLEXERIL) 10 MG tablet Take 1 tablet (10 mg total) by mouth 3 (three) times daily as needed for muscle spasms. 02/17/17   Volney American, PA-C  guaiFENesin-codeine [facility]) 100-10 MG/5ML syrup Take 5 mLs  by mouth 3 (three) times daily as needed for up to 2 days for cough. 02/23/17 02/25/17  Laban Emperor, PA-C  HYDROcodone-acetaminophen (NORCO/VICODIN) 5-325 MG tablet Take 1 tablet by mouth every 8 (eight) hours as needed for moderate pain. Patient not taking: Reported on 02/02/2017 01/24/17   Volney American, PA-C  lidocaine (LIDODERM) 5 % Place 1 patch onto the skin daily. Remove & Discard patch within 12 hours or as directed by MD 02/17/17   Volney American, PA-C  methocarbamol (ROBAXIN) 500 MG tablet Take 1 tablet (500 mg total) by mouth 4 (four) times daily. Patient not taking: Reported on 02/17/2017 02/02/17   Volney American, PA-C  ondansetron (ZOFRAN ODT) 4 MG disintegrating tablet Take 1 tablet (4 mg total) by mouth every 8 (eight) hours as needed. 02/02/17   Volney American, PA-C  oseltamivir (TAMIFLU) 75 MG capsule Take 1 capsule (75 mg total) by mouth 2 (two) times daily for 5 days. 02/23/17 02/28/17  Laban Emperor, PA-C  oxyCODONE-acetaminophen (PERCOCET/ROXICET) 5-325 MG tablet Take 1 tablet by mouth every 6 (six) hours as needed for severe pain. 02/17/17   Volney American, PA-C  predniSONE (DELTASONE) 10 MG tablet Take 6 tabs day one, 5 tabs day two, 4 tabs day three, etc 02/17/17   Volney American, PA-C    Allergies Magnesium-containing compounds and Tramadol  Family History  Problem Relation Age of Onset  . Breast cancer Paternal Aunt   . Diabetes Paternal Aunt   . Diabetes Maternal Grandfather   . COPD Maternal Grandmother   .  Cancer Maternal Grandmother        lung  . Colon cancer Neg Hx   . Ovarian cancer Neg Hx   . Heart disease Neg Hx     Social History Social History   Tobacco Use  . Smoking status: Former Smoker    Packs/day: 0.50    Years: 4.00    Pack years: 2.00    Types: Cigarettes    Last attempt to quit: 04/02/2014    Years since quitting: 2.8  . Smokeless tobacco: Never Used  Substance Use Topics  . Alcohol use: No     Alcohol/week: 0.0 oz  . Drug use: No     Review of Systems  Eyes: No visual changes. No discharge. ENT: Positive for congestion and rhinorrhea. Cardiovascular: No chest pain. Respiratory: Positive for cough. No SOB. Gastrointestinal: No abdominal pain.  No nausea, no vomiting.  No diarrhea.  No constipation. Musculoskeletal: Positive for body aches. Skin: Negative for rash, abrasions, lacerations, ecchymosis.   ____________________________________________   PHYSICAL EXAM:  VITAL SIGNS: ED Triage Vitals  Enc Vitals Group     BP 02/23/17 0801 134/79     Pulse Rate 02/23/17 0801 (!) 137     Resp 02/23/17 0801 18     Temp 02/23/17 0801 100.1 F (37.8 C)     Temp Source 02/23/17 0801 Oral     SpO2 02/23/17 0801 98 %     Weight 02/23/17 0801 152 lb (68.9 kg)     Height 02/23/17 0801 5\' 3"  (1.6 m)     Head Circumference --      Peak Flow --      Pain Score 02/23/17 0812 10     Pain Loc --      Pain Edu? --      Excl. in [place]? --      Constitutional: Alert and oriented. Well appearing and in no acute distress. Eyes: Conjunctivae are normal. PERRL. EOMI. No discharge. Head: Atraumatic. ENT: No frontal and maxillary sinus tenderness.      Ears: Tympanic membranes pearly gray with good landmarks. No discharge.      Nose: Mild congestion/rhinnorhea.      Mouth/Throat: Mucous membranes are moist. Oropharynx non-erythematous. Tonsils not enlarged. No exudates. Uvula midline. Neck: No stridor.   Hematological/Lymphatic/Immunilogical: No cervical lymphadenopathy. Cardiovascular: Normal rate, regular rhythm.  Good peripheral circulation. Respiratory: Normal respiratory effort without tachypnea or retractions. Lungs CTAB. Good air entry to the bases with no decreased or absent breath sounds. Gastrointestinal: Bowel sounds 4 quadrants. Soft and nontender to palpation. No guarding or rigidity. No palpable masses. No distention. Musculoskeletal: Full range of motion to all extremities.  No gross deformities appreciated. Neurologic:  Normal speech and language. No gross focal neurologic deficits are appreciated.  Skin:  Skin is warm, dry and intact. No rash noted.    ____________________________________________   LABS (all labs ordered are listed, but only abnormal results are displayed)  Labs Reviewed - No data to display ____________________________________________  EKG   ____________________________________________  RADIOLOGY  No results found.  ____________________________________________    PROCEDURES  Procedure(s) performed:    Procedures    Medications - No data to display   ____________________________________________   INITIAL IMPRESSION / ASSESSMENT AND PLAN / ED COURSE  Pertinent labs & imaging results that were available during my care of the patient were reviewed by me and considered in my medical decision making (see chart for details).  Review of the [place] CSRS was performed in accordance of  the [place] prior to dispensing any controlled drugs.     Patient's diagnosis is consistent with influenza. Vital signs and exam are reassuring. Patient appears well and is staying well hydrated. Patient should alternate tylenol and ibuprofen for fever. Patient feels comfortable going home. Patient will be discharged home with prescriptions for Tamiflu, Robitussin, albuterol inhaler. Patient is to follow up with PCP as needed or otherwise directed. Patient is given ED precautions to return to the ED for any worsening or new symptoms.     ____________________________________________  FINAL CLINICAL IMPRESSION(S) / ED DIAGNOSES  Final diagnoses:  Influenza      NEW MEDICATIONS STARTED DURING THIS VISIT:  ED Discharge Orders        Ordered    oseltamivir (TAMIFLU) 75 MG capsule  2 times daily     02/23/17 0935    guaiFENesin-codeine (ROBITUSSIN AC) 100-10 MG/5ML syrup  3 times daily PRN     02/23/17 0935    albuterol (PROVENTIL  HFA;VENTOLIN HFA) 108 (90 Base) MCG/ACT inhaler  Every 6 hours PRN     02/23/17 0935          This chart was dictated using voice recognition software/Dragon. Despite best efforts to proofread, errors can occur which can change the meaning. Any change was purely unintentional.    Laban Emperor, PA-C 02/23/17 [place], Randall An, MD 02/23/17 [phone]

## 2017-02-28 ENCOUNTER — Emergency Department: Payer: Medicaid Other

## 2017-02-28 ENCOUNTER — Other Ambulatory Visit: Payer: Self-pay

## 2017-02-28 ENCOUNTER — Encounter: Payer: Self-pay | Admitting: Radiology

## 2017-02-28 ENCOUNTER — Emergency Department
Admission: EM | Admit: 2017-02-28 | Discharge: 2017-02-28 | Disposition: A | Discharge status: Home / Self Care | Payer: Medicaid Other | Attending: Emergency Medicine | Admitting: Emergency Medicine

## 2017-02-28 ENCOUNTER — Ambulatory Visit
Admission: RE | Admit: 2017-02-28 | Discharge: 2017-02-28 | Disposition: A | Discharge status: Home / Self Care | Payer: Medicaid Other | Source: Ambulatory Visit | Attending: Physician Assistant | Admitting: Physician Assistant

## 2017-02-28 DIAGNOSIS — R112 Nausea with vomiting, unspecified: Secondary | ICD-10-CM | POA: Diagnosis not present

## 2017-02-28 DIAGNOSIS — M5136 Other intervertebral disc degeneration, lumbar region: Secondary | ICD-10-CM | POA: Diagnosis not present

## 2017-02-28 DIAGNOSIS — R1010 Upper abdominal pain, unspecified: Secondary | ICD-10-CM | POA: Diagnosis not present

## 2017-02-28 DIAGNOSIS — Z79899 Other long term (current) drug therapy: Secondary | ICD-10-CM | POA: Insufficient documentation

## 2017-02-28 DIAGNOSIS — R160 Hepatomegaly, not elsewhere classified: Secondary | ICD-10-CM | POA: Insufficient documentation

## 2017-02-28 DIAGNOSIS — Q7649 Other congenital malformations of spine, not associated with scoliosis: Secondary | ICD-10-CM | POA: Diagnosis not present

## 2017-02-28 DIAGNOSIS — R109 Unspecified abdominal pain: Secondary | ICD-10-CM

## 2017-02-28 DIAGNOSIS — M5441 Lumbago with sciatica, right side: Secondary | ICD-10-CM | POA: Diagnosis present

## 2017-02-28 LAB — CBC
HEMATOCRIT: 40.9 % (ref 35.0–47.0)
Hemoglobin: 14.2 g/dL (ref 12.0–16.0)
MCH: 29.7 pg (ref 26.0–34.0)
MCHC: 34.6 g/dL (ref 32.0–36.0)
MCV: 85.7 fL (ref 80.0–100.0)
PLATELETS: 186 10*3/uL (ref 150–440)
RBC: 4.77 MIL/uL (ref 3.80–5.20)
RDW: 13 % (ref 11.5–14.5)
WBC: 6 10*3/uL (ref 3.6–11.0)

## 2017-02-28 LAB — COMPREHENSIVE METABOLIC PANEL
ALBUMIN: 4.3 g/dL (ref 3.5–5.0)
ALT: 41 U/L (ref 14–54)
AST: 33 U/L (ref 15–41)
Alkaline Phosphatase: 74 U/L (ref 38–126)
Anion gap: 10 (ref 5–15)
BILIRUBIN TOTAL: 0.5 mg/dL (ref 0.3–1.2)
BUN: 12 mg/dL (ref 6–20)
CO2: 23 mmol/L (ref 22–32)
Calcium: 8.9 mg/dL (ref 8.9–10.3)
Chloride: 104 mmol/L (ref 101–111)
Creatinine, Ser: 0.78 mg/dL (ref 0.44–1.00)
GFR calc Af Amer: >60 mL/min (ref >60)
GLUCOSE: 91 mg/dL (ref 65–99)
POTASSIUM: 3.9 mmol/L (ref 3.5–5.1)
Sodium: 137 mmol/L (ref 135–145)
TOTAL PROTEIN: 7.8 g/dL (ref 6.5–8.1)

## 2017-02-28 LAB — URINALYSIS, COMPLETE (UACMP) WITH MICROSCOPIC
Bilirubin Urine: NEGATIVE
Glucose, UA: NEGATIVE mg/dL
Ketones, ur: NEGATIVE mg/dL
Leukocytes, UA: NEGATIVE
NITRITE: NEGATIVE
PH: 7 (ref 5.0–8.0)
Protein, ur: NEGATIVE mg/dL
SPECIFIC GRAVITY, URINE: 1.016 (ref 1.005–1.030)

## 2017-02-28 LAB — POCT PREGNANCY, URINE: Preg Test, Ur: NEGATIVE

## 2017-02-28 LAB — LIPASE, BLOOD: Lipase: 28 U/L (ref 11–51)

## 2017-02-28 MED ORDER — IOPAMIDOL (ISOVUE-300) INJECTION 61%
100.0000 mL | Freq: Once | INTRAVENOUS | Status: AC | PRN
  Administered 2017-02-28: 100 mL via INTRAVENOUS

## 2017-02-28 MED ORDER — ONDANSETRON HCL 4 MG/2ML IJ SOLN
4.0000 mg | Freq: Once | INTRAMUSCULAR | Status: AC
  Administered 2017-02-28: 4 mg via INTRAVENOUS

## 2017-02-28 MED ORDER — ONDANSETRON HCL 4 MG/2ML IJ SOLN
4.0000 mg | Freq: Once | INTRAMUSCULAR | Status: AC
  Administered 2017-02-28: 4 mg via INTRAVENOUS
  Filled 2017-02-28: qty 2

## 2017-02-28 MED ORDER — ONDANSETRON HCL 4 MG/2ML IJ SOLN
INTRAMUSCULAR | Status: AC
  Filled 2017-02-28: qty 2

## 2017-02-28 MED ORDER — DICYCLOMINE HCL 10 MG PO CAPS: 10.0000 mg | ORAL_CAPSULE | Freq: Four times a day (QID) | ORAL | 0 refills | Status: DC

## 2017-02-28 MED ORDER — FAMOTIDINE 40 MG PO TABS: 40.0000 mg | ORAL_TABLET | Freq: Every evening | ORAL | 0 refills | Status: DC

## 2017-02-28 MED ORDER — MORPHINE SULFATE (PF) 4 MG/ML IV SOLN
INTRAVENOUS | Status: DC
Start: 2017-02-28 — End: 2017-02-28
  Filled 2017-02-28: qty 1

## 2017-02-28 MED ORDER — METOCLOPRAMIDE HCL 5 MG/ML IJ SOLN
10.0000 mg | Freq: Once | INTRAMUSCULAR | Status: AC
  Administered 2017-02-28: 10 mg via INTRAVENOUS
  Filled 2017-02-28: qty 2

## 2017-02-28 MED ORDER — SUCRALFATE 1 G PO TABS
1.0000 g | ORAL_TABLET | Freq: Once | ORAL | Status: AC
  Administered 2017-02-28: 1 g via ORAL
  Filled 2017-02-28: qty 1

## 2017-02-28 MED ORDER — METOCLOPRAMIDE HCL 10 MG PO TABS: 10.0000 mg | ORAL_TABLET | Freq: Three times a day (TID) | ORAL | 0 refills | Status: DC | PRN

## 2017-02-28 MED ORDER — SODIUM CHLORIDE 0.9 % IV BOLUS (SEPSIS)
1000.0000 mL | Freq: Once | INTRAVENOUS | Status: AC
  Administered 2017-02-28: 1000 mL via INTRAVENOUS

## 2017-02-28 MED ORDER — MORPHINE SULFATE (PF) 4 MG/ML IV SOLN
4.0000 mg | Freq: Once | INTRAVENOUS | Status: AC
  Administered 2017-02-28: 4 mg via INTRAVENOUS

## 2017-02-28 MED ORDER — KETOROLAC TROMETHAMINE 30 MG/ML IJ SOLN
30.0000 mg | Freq: Once | INTRAMUSCULAR | Status: AC
  Administered 2017-02-28: 30 mg via INTRAVENOUS
  Filled 2017-02-28: qty 1

## 2017-02-28 NOTE — ED Notes (Signed)
Pt transported back from [facility].

## 2017-02-28 NOTE — ED Notes (Signed)
Pt given water for PO challenge 

## 2017-02-28 NOTE — ED Notes (Signed)
Pt transported to [facility]

## 2017-02-28 NOTE — Discharge Instructions (Signed)
Please follow up with GI for further evaluation of your liver lesion. Please return with any worsening symptoms

## 2017-02-28 NOTE — ED Triage Notes (Signed)
Pt reports that she was diagnosed with the flu last Wednesday, pt states that she started feeling better on Friday, pt states that now she is having upper left abd cramping with vomiting with a small amount of diarrhea

## 2017-02-28 NOTE — ED Provider Notes (Signed)
[facility] Emergency Department Provider Note   ____________________________________________   First MD Initiated Contact with Patient 02/28/17 0125     (approximate)  I have reviewed the triage vital signs and the nursing notes.   HISTORY  Chief Complaint Abdominal Pain    HPI Cassandra Duarte is a 32 y.o. female who comes into the hospital today with some abdominal pain and vomiting.  The patient was here 4 days ago and was told that she had the flu.  She reports that she was given Tamiflu and her fever had improved by Friday.  Last night though she states that her stomach was cramping and then today started having some vomiting.  The patient initially thought the cramping was from her menstrual cycle but the cramps were getting worse and worse.  The patient reports that she is unsure exactly how much she is vomited but now it is just stomach acid.  She reports that it burns when it comes up.  The patient took her Tamiflu up till today but could not stomach it anymore.  She tried some ibuprofen at home but states that it made her stomach hurt worse.  Patient is nauseous and states that her pain is a 10 out of 10 in intensity.  The patient's pain is in her epigastric and left upper quadrant area.  She states that she did have some diarrhea but she did take some castor oil as she felt like she might have been constipated.  The patient is here today for evaluation.   Past Medical History:  Diagnosis Date  . Vaginal Pap smear, abnormal 2012   REPEAT AND WAS NORMAL    Patient Active Problem List   Diagnosis Date Noted  . Overweight 10/01/2015  . Lactational amenorrhea 10/01/2015  . Family history of breast cancer 10/01/2015  . 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 DILATION AND CURETTAGE OF UTERUS  2014   WS  . TUBAL LIGATION Bilateral 02/21/2015   Procedure: BILATERAL TUBAL  LIGATION;  Surgeon: Brayton Mars, MD;  Location: [facility] ORS;  Service: Gynecology;  Laterality: Bilateral;    Prior to Admission medications   Medication Sig Start Date End Date Taking? Authorizing Provider  albuterol (PROVENTIL HFA;VENTOLIN HFA) 108 (90 Base) MCG/ACT inhaler Inhale 2 puffs into the lungs every 6 (six) hours as needed for wheezing or shortness of breath. 02/23/17   Laban Emperor, PA-C  cyclobenzaprine (FLEXERIL) 10 MG tablet Take 1 tablet (10 mg total) by mouth 3 (three) times daily as needed for muscle spasms. 02/17/17   Volney American, PA-C  dicyclomine (BENTYL) 10 MG capsule Take 1 capsule (10 mg total) by mouth 4 (four) times daily for 5 days. 02/28/17 03/05/17  Loney Hering, MD  famotidine (PEPCID) 40 MG tablet Take 1 tablet (40 mg total) by mouth every evening. 02/28/17 02/28/18  Loney Hering, MD  lidocaine (LIDODERM) 5 % Place 1 patch onto the skin daily. Remove & Discard patch within 12 hours or as directed by MD 02/17/17   Volney American, PA-C  methocarbamol (ROBAXIN) 750 MG tablet Take 750 mg by mouth every 6 (six) hours. 02/10/17   [provider]  metoCLOPramide (REGLAN) 10 MG tablet Take 1 tablet (10 mg total) by mouth every 8 (eight) hours as needed. 02/28/17   Loney Hering, MD  ondansetron (ZOFRAN ODT) 4 MG disintegrating tablet Take 1 tablet (4  mg total) by mouth every 8 (eight) hours as needed. 02/02/17   Volney American, PA-C  oseltamivir (TAMIFLU) 75 MG capsule Take 1 capsule (75 mg total) by mouth 2 (two) times daily for 5 days. 02/23/17 02/28/17  Laban Emperor, PA-C  oxyCODONE-acetaminophen (PERCOCET/ROXICET) 5-325 MG tablet Take 1 tablet by mouth every 6 (six) hours as needed for severe pain. 02/17/17   Volney American, PA-C  predniSONE (DELTASONE) 10 MG tablet Take 6 tabs day one, 5 tabs day two, 4 tabs day three, etc 02/17/17   Volney American, PA-C    Allergies Magnesium-containing compounds and  Tramadol  Family History  Problem Relation Age of Onset  . Breast cancer Paternal Aunt   . Diabetes Paternal Aunt   . Diabetes Maternal Grandfather   . COPD Maternal Grandmother   . Cancer Maternal Grandmother        lung  . Colon cancer Neg Hx   . Ovarian cancer Neg Hx   . Heart disease Neg Hx     Social History Social History   Tobacco Use  . Smoking status: Former Smoker    Packs/day: 0.50    Years: 4.00    Pack years: 2.00    Types: Cigarettes    Last attempt to quit: 04/02/2014    Years since quitting: 2.9  . Smokeless tobacco: Never Used  Substance Use Topics  . Alcohol use: No    Alcohol/week: 0.0 oz  . Drug use: No    Review of Systems  Constitutional:  fever/chills Eyes: No visual changes. ENT: No sore throat. Cardiovascular: Denies chest pain. Respiratory: Denies shortness of breath. Gastrointestinal:  abdominal pain, nausea, vomiting.  No diarrhea.  No constipation. Genitourinary: Negative for dysuria. Musculoskeletal: Negative for back pain. Skin: Negative for rash. Neurological: Negative for headaches, focal weakness or numbness.   ____________________________________________   PHYSICAL EXAM:  VITAL SIGNS: ED Triage Vitals  Enc Vitals Group     BP 02/28/17 0059 138/84     Pulse Rate 02/28/17 0059 (!) 104     Resp 02/28/17 0059 18     Temp 02/28/17 0059 99.3 F (37.4 C)     Temp Source 02/28/17 0059 Oral     SpO2 02/28/17 0059 98 %     Weight 02/28/17 0059 148 lb (67.1 kg)     Height 02/28/17 0059 5\' 3"  (1.6 m)     Head Circumference --      Peak Flow --      Pain Score 02/28/17 0105 9     Pain Loc --      Pain Edu? --      Excl. in [place]? --     Constitutional: Alert and oriented. Well appearing and in moderate distress. Eyes: Conjunctivae are normal. PERRL. EOMI. Head: Atraumatic. Nose: No congestion/rhinnorhea. Mouth/Throat: Mucous membranes are moist.  Oropharynx non-erythematous. Cardiovascular: Normal rate, regular rhythm.  Grossly normal heart sounds.  Good peripheral circulation. Respiratory: Normal respiratory effort.  No retractions. Lungs CTAB. Gastrointestinal: Soft with some diffuse tenderness to palpation worse in the epigastric and LUQ. No distention. Positive bowel sounds Musculoskeletal: No lower extremity tenderness nor edema.   Neurologic:  Normal speech and language.  Skin:  Skin is warm, dry and intact.  Psychiatric: Mood and affect are normal.   ____________________________________________   LABS (all labs ordered are listed, but only abnormal results are displayed)  Labs Reviewed  URINALYSIS, COMPLETE (UACMP) WITH MICROSCOPIC - Abnormal; Notable for the following components:  Result Value   Color, Urine YELLOW (*)    APPearance CLEAR (*)    Hgb urine dipstick MODERATE (*)    Bacteria, UA RARE (*)    Squamous Epithelial / LPF 0-5 (*)    All other components within normal limits  LIPASE, BLOOD  COMPREHENSIVE METABOLIC PANEL  CBC  POC URINE PREG, ED  POCT PREGNANCY, URINE   ____________________________________________  EKG  none ____________________________________________  RADIOLOGY  ED MD interpretation: Abdominal ultrasound: Liver mass noted with some gallbladder polyps                                      CT abdomen and pelvis: 4.3 cm liver mass with some central scar                                             tissue likely focal benign hyperplasia.  Otherwise CT unremarkable.  Official radiology report(s): Ct Abdomen Pelvis W Contrast  Result Date: 02/28/2017 CLINICAL DATA:  Acute onset of left upper quadrant abdominal cramping and vomiting. Diarrhea. EXAM: CT ABDOMEN AND PELVIS WITH CONTRAST TECHNIQUE: Multidetector CT imaging of the abdomen and pelvis was performed using the standard protocol following bolus administration of intravenous contrast. CONTRAST:  14mL ISOVUE-300 IOPAMIDOL (ISOVUE-300) INJECTION 61% COMPARISON:  Right upper quadrant abdominal ultrasound  performed earlier today at 2:42 a.m., and CT of the abdomen and pelvis performed 02/04/2012 FINDINGS: Lower chest: The visualized lung bases are grossly clear. The visualized portions of the mediastinum are unremarkable. Hepatobiliary: A 4.3 cm mildly hyperattenuating lesion at the right hepatic lobe with a central scar most likely reflects focal nodular hyperplasia. A small nonspecific 7 mm hypodensity at the hepatic dome may reflect a cyst. The liver is otherwise unremarkable. The hypoechoic focus at the left hepatic lobe on ultrasound is not well characterized on CT, and may have reflected focal fatty sparing. The gallbladder is unremarkable in appearance. The common bile duct remains normal in caliber. Pancreas: The pancreas is within normal limits. Spleen: The spleen is unremarkable in appearance. Adrenals/Urinary Tract: The adrenal glands are unremarkable in appearance. The kidneys are within normal limits. There is no evidence of hydronephrosis. No renal or ureteral stones are identified. No perinephric stranding is seen. Stomach/Bowel: The stomach is unremarkable in appearance. The small bowel is within normal limits. The appendix is normal in caliber, without evidence of appendicitis. The colon is unremarkable in appearance. Vascular/Lymphatic: The abdominal aorta is unremarkable in appearance. The inferior vena cava is grossly unremarkable. No retroperitoneal lymphadenopathy is seen. No pelvic sidewall lymphadenopathy is identified. Reproductive: The bladder is mildly distended and grossly unremarkable. The uterus is unremarkable in appearance. The ovaries are relatively symmetric. No suspicious adnexal masses are seen. Other: No additional soft tissue abnormalities are seen. Musculoskeletal: No acute osseous abnormalities are identified. Chronic bilateral pars defects are seen at L5, without significant anterolisthesis. The visualized musculature is unremarkable in appearance. IMPRESSION: 1. No acute  abnormality seen to explain the patient's symptoms. 2. 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 3. 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 Electronically Signed   By: Garald Balding M.D.   On: 02/28/2017 04:12   US Abdomen Limited Ruq  Result Date: 02/28/2017  CLINICAL DATA:  Acute onset of right upper quadrant abdominal pain. EXAM: ULTRASOUND ABDOMEN LIMITED RIGHT UPPER QUADRANT COMPARISON:  Right upper quadrant ultrasound performed 12/22/2012 FINDINGS: Gallbladder: Scattered polyps are noted within the gallbladder, measuring up to 6 mm in size. No stones are seen. No gallbladder wall thickening or pericholecystic fluid is seen. No ultrasonographic Murphy's sign is elicited. Common bile duct: Diameter: 0.3 cm, within normal limits in caliber. Liver: A heterogeneous hypoechoic mass with internal blood flow is noted at the right hepatic lobe, measuring 4.0 x 3.9 x 3.9 cm. A smaller hypoechoic lesion is seen at the left hepatic lobe, measuring 1.3 cm. Diffusely increased parenchymal echogenicity likely reflects fatty infiltration. Portal vein is patent on color Doppler imaging with normal direction of blood flow towards the liver. IMPRESSION: 1. Heterogeneous hypoechoic mass with internal blood flow at the right hepatic lobe, measuring 4.0 cm. Malignancy cannot be excluded. Would correlate with LFTs, and perform dynamic liver protocol MRI or CT for further evaluation. 2. Smaller hypoechoic 1.3 cm nonspecific lesion at the left hepatic lobe. 3. Scattered polyps again noted within the gallbladder. Gallbladder otherwise unremarkable. 4. Diffuse fatty infiltration within the liver. Electronically Signed   By: Garald Balding M.D.   On: 02/28/2017 03:09    ____________________________________________   PROCEDURES  Procedure(s) performed: None  Procedures  Critical Care performed:  No  ____________________________________________   INITIAL IMPRESSION / ASSESSMENT AND PLAN / ED COURSE  As part of my medical decision making, I reviewed the following data within the electronic MEDICAL RECORD NUMBER Notes from prior ED visits and [place] Controlled Substance Database   This is a 32 year old female who comes into the hospital today with some upper abdominal pain and vomiting.  The patient was recently diagnosed with influenza and has been taking Tamiflu.  Differential diagnosis includes medication reaction, gastritis, pancreatitis, biliary colic, cholelithiasis.  We did give the patient a liter of normal saline as well as some Toradol and Zofran.  We are awaiting the results of the patient's blood work to determine the remaining testing that needs to be done.  The patient will be reassessed.    The patient received some Reglan and Carafate.  She also received some morphine and Zofran.  Her ultrasound initially showed a 4 cm mass but the CT scan showed some hyperplasia with scar tissue that was likely benign.  I discussed this with the patient but she does need to follow-up with GI.  I feel that the patient has some gastritis possibly with some medication reaction from the Tamiflu.  She will be discharged home and encouraged to follow-up with her primary care physician.  ____________________________________________   FINAL CLINICAL IMPRESSION(S) / ED DIAGNOSES  Final diagnoses:  Abdominal pain  Non-intractable vomiting with nausea, unspecified vomiting type  Liver mass     ED Discharge Orders        Ordered    metoCLOPramide (REGLAN) 10 MG tablet  Every 8 hours PRN     02/28/17 0556    dicyclomine (BENTYL) 10 MG capsule  4 times daily     02/28/17 0556    famotidine (PEPCID) 40 MG tablet  Every evening     02/28/17 0556       Note:  This document was prepared using Dragon voice recognition software and may include unintentional dictation errors.    Loney Hering, MD 02/28/17 [phone]

## 2017-03-03 ENCOUNTER — Other Ambulatory Visit
Admission: RE | Admit: 2017-03-03 | Discharge: 2017-03-03 | Disposition: A | Discharge status: Home / Self Care | Payer: Medicaid Other | Source: Ambulatory Visit | Attending: Gastroenterology | Admitting: Gastroenterology

## 2017-03-03 ENCOUNTER — Encounter: Payer: Self-pay | Admitting: Gastroenterology

## 2017-03-03 ENCOUNTER — Other Ambulatory Visit: Payer: Self-pay

## 2017-03-03 ENCOUNTER — Ambulatory Visit (INDEPENDENT_AMBULATORY_CARE_PROVIDER_SITE_OTHER): Payer: Medicaid Other | Admitting: Gastroenterology

## 2017-03-03 VITALS — BP 111/80 | HR 83 | Temp 98.1°F | Ht 63.0 in | Wt 155.0 lb

## 2017-03-03 DIAGNOSIS — Z8379 Family history of other diseases of the digestive system: Secondary | ICD-10-CM

## 2017-03-03 DIAGNOSIS — R1084 Generalized abdominal pain: Secondary | ICD-10-CM | POA: Diagnosis not present

## 2017-03-03 DIAGNOSIS — R1013 Epigastric pain: Secondary | ICD-10-CM | POA: Insufficient documentation

## 2017-03-03 DIAGNOSIS — R197 Diarrhea, unspecified: Secondary | ICD-10-CM | POA: Insufficient documentation

## 2017-03-03 DIAGNOSIS — K7689 Other specified diseases of liver: Secondary | ICD-10-CM

## 2017-03-03 LAB — FERRITIN: FERRITIN: 65 ng/mL (ref 11–307)

## 2017-03-03 LAB — IRON AND TIBC
IRON: 62 ug/dL (ref 28–170)
Saturation Ratios: 19 % (ref 10.4–31.8)
TIBC: 326 ug/dL (ref 250–450)
UIBC: 264 ug/dL

## 2017-03-03 NOTE — Progress Notes (Signed)
Cassandra Darby, MD [address]  [place], [place] [address]  Main: [phone]  Fax: [phone]    Gastroenterology Consultation  Referring Provider:     Volney American,* Primary Care Physician:  Volney American, PA-C Primary Gastroenterologist:  Dr. Cephas Duarte Reason for Consultation:     Upper abdominal pain, nausea, liver nodule        HPI:   Cassandra Duarte is a 32 y.o. female referred by Dr. Orene Desanctis, Cassandra Argue, PA-C  for consultation & management of and chronic upper abdominal pain, nausea. Patient reports 6 month history of dull/sometimes sharp right upper quadrant and epigastric discomfort associated with intermittent nausea. She went to ER last week secondary to URI and diagnosed with flow, was started on Tamiflu. Within 2-3 days, patient started having worsening of upper GI symptoms including abdominal cramps, nausea and vomiting. She went to ER again, her CBC and LFTs were normal. Lipase was normal. She had an ultrasound which incidentally showed 4 x 3.9 x 3.9 cm cm hypoechoic lesion in right lobe of the liver, fatty liver and subsequently underwent CT liver mass protocol for further evaluation of this lesion and it was consistent with focal nodular hyperplasia. She had mildly elevated transaminases during her first ER visit, normal during her second ER visit. Patient reports that her cramps, nausea and vomiting have resolved. She continues to have right upper quadrant and epigastric dull pain which is worse when she presses on it or changing position. She noticed that the pain has overall gotten worse since last week. She also reports bloating and overall loose stools, nonbloody, which sometimes get worse after eating bread. She reports that her daughter has celiac disease and her family has several autoimmune conditions including myasthenia gravis, IgA deficiency. She denies taking PPI or H2 blocker.  She reports that she was tested  for celiac disease several years ago by serologies and she was told that she does not have celiac disease. She is accompanied by her mom today who has concerns about the liver lesion because of her family history of liver cirrhosis and liver cancer which was attributed to be secondary to fatty liver.  Patient smokes cigarettes up to 3 per day. She does not drink alcohol. She denies blood transfusions in the past, IV drug abuse. No known hepatitis B or C  NSAIDs: none  Antiplts/Anticoagulants/Anti thrombotics: none  GI Procedures: none She did not have any GI surgeries  Past Medical History:  Diagnosis Date  . Vaginal Pap smear, abnormal 2012   REPEAT AND WAS NORMAL    Past Surgical History:  Procedure Laterality Date  . DILATION AND CURETTAGE OF UTERUS  2014   WS  . TUBAL LIGATION Bilateral 02/21/2015   Procedure: BILATERAL TUBAL LIGATION;  Surgeon: Brayton Mars, MD;  Location: [facility] ORS;  Service: Gynecology;  Laterality: Bilateral;     Current Outpatient Medications:  .  albuterol (PROVENTIL HFA;VENTOLIN HFA) 108 (90 Base) MCG/ACT inhaler, Inhale 2 puffs into the lungs every 6 (six) hours as needed for wheezing or shortness of breath. (Patient not taking: Reported on 03/03/2017), Disp: 1 Inhaler, Rfl: 0 .  cyclobenzaprine (FLEXERIL) 10 MG tablet, Take 1 tablet (10 mg total) by mouth 3 (three) times daily as needed for muscle spasms. (Patient not taking: Reported on 03/03/2017), Disp: 30 tablet, Rfl: 0 .  dicyclomine (BENTYL) 10 MG capsule, Take 1 capsule (10 mg total) by mouth 4 (four) times daily for 5 days. (  Patient not taking: Reported on 03/03/2017), Disp: 20 capsule, Rfl: 0 .  famotidine (PEPCID) 40 MG tablet, Take 1 tablet (40 mg total) by mouth every evening. (Patient not taking: Reported on 03/03/2017), Disp: 7 tablet, Rfl: 0 .  lidocaine (LIDODERM) 5 %, Place 1 patch onto the skin daily. Remove & Discard patch within 12 hours or as directed by MD (Patient not taking:  Reported on 03/03/2017), Disp: 30 patch, Rfl: 0 .  methocarbamol (ROBAXIN) 750 MG tablet, Take 750 mg by mouth every 6 (six) hours., Disp: , Rfl: 0 .  metoCLOPramide (REGLAN) 10 MG tablet, Take 1 tablet (10 mg total) by mouth every 8 (eight) hours as needed. (Patient not taking: Reported on 03/03/2017), Disp: 20 tablet, Rfl: 0 .  ondansetron (ZOFRAN ODT) 4 MG disintegrating tablet, Take 1 tablet (4 mg total) by mouth every 8 (eight) hours as needed. (Patient not taking: Reported on 03/03/2017), Disp: 45 tablet, Rfl: 0 .  oxyCODONE-acetaminophen (PERCOCET/ROXICET) 5-325 MG tablet, Take 1 tablet by mouth every 6 (six) hours as needed for severe pain. (Patient not taking: Reported on 03/03/2017), Disp: 20 tablet, Rfl: 0   Family History  Problem Relation Age of Onset  . Breast cancer Paternal Aunt   . Diabetes Paternal Aunt   . Diabetes Maternal Grandfather   . COPD Maternal Grandmother   . Cancer Maternal Grandmother        lung  . Colon cancer Neg Hx   . Ovarian cancer Neg Hx   . Heart disease Neg Hx      Social History   Tobacco Use  . Smoking status: Former Smoker    Packs/day: 0.50    Years: 4.00    Pack years: 2.00    Types: Cigarettes    Last attempt to quit: 04/02/2014    Years since quitting: 2.9  . Smokeless tobacco: Never Used  Substance Use Topics  . Alcohol use: No    Alcohol/week: 0.0 oz  . Drug use: No    Allergies as of 03/03/2017 - Review Complete 03/03/2017  Allergen Reaction Noted  . Magnesium-containing compounds Anaphylaxis 06/25/2014  . Tramadol Itching 06/25/2014    Review of Systems:    All systems reviewed and negative except where noted in HPI.   Physical Exam:  BP 111/80   Pulse 83   Temp 98.1 F (36.7 C) (Oral)   Ht 5\' 3"  (1.6 m)   Wt 155 lb (70.3 kg)   BMI 27.46 kg/m  No LMP recorded.  General:   Alert,  Well-developed, well-nourished, pleasant and cooperative in NAD Head:  Normocephalic and atraumatic. Eyes:  Sclera clear, no icterus.    Conjunctiva pink. Ears:  Normal auditory acuity. Nose:  No deformity, discharge, or lesions. Mouth:  No deformity or lesions,oropharynx pink & moist. Neck:  Supple; no masses or thyromegaly. Lungs:  Respirations even and unlabored.  Clear throughout to auscultation.   No wheezes, crackles, or rhonchi. No acute distress. Heart:  Regular rate and rhythm; no murmurs, clicks, rubs, or gallops. Abdomen:  Normal bowel sounds. Soft, non-tender and non-distended without masses, hepatosplenomegaly or hernias noted.  No guarding or rebound tenderness.   Rectal: Not performed Msk:  Symmetrical without gross deformities. Good, equal movement & strength bilaterally. Pulses:  Normal pulses noted. Extremities:  No clubbing or edema.  No cyanosis. Neurologic:  Alert and oriented x3;  grossly normal neurologically. Skin:  Intact without significant lesions or rashes. No jaundice. Lymph Nodes:  No significant cervical adenopathy. Psych:  Alert  and cooperative. Normal mood and affect.  Imaging Studies: reviewed  Assessment and Plan:   Angeliyah Kirkey is a 32 y.o. female with six-month history of mild to moderate epigastric and right upper quadrant pain associated with intermittent nausea, bloating. She does have chronic history of increased bowel frequency, nonbloody. 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 Educated patient that this lesion is not precancerous or cancerous and nothing to worry about malignant transformation. - We will repeat ultrasound in 3-6 months to confirm stability   Follow up based on the above  results   Cassandra Darby, MD

## 2017-03-04 ENCOUNTER — Other Ambulatory Visit: Payer: Self-pay | Admitting: Gastroenterology

## 2017-03-04 LAB — MISC LABCORP TEST (SEND OUT): LABCORP TEST CODE: 164002

## 2017-03-04 LAB — VITAMIN D 25 HYDROXY (VIT D DEFICIENCY, FRACTURES): VIT D 25 HYDROXY: 14.3 ng/mL — AB (ref 30.0–100.0)

## 2017-03-04 MED ORDER — VITAMIN D (ERGOCALCIFEROL) 1.25 MG (50000 UNIT) PO CAPS: 50000.0000 [IU] | ORAL_CAPSULE | ORAL | 0 refills | Status: DC

## 2017-03-23 ENCOUNTER — Telehealth: Payer: Self-pay | Admitting: Gastroenterology

## 2017-03-23 NOTE — Telephone Encounter (Signed)
Pt wants to cancel procedure for 03-24-17 she states her mother is having surgery and she will call back when she is ready to reschedule this procedure.

## 2017-03-24 ENCOUNTER — Ambulatory Visit: Admission: RE | Admit: 2017-03-24 | Payer: Medicaid Other | Source: Ambulatory Visit | Admitting: Gastroenterology

## 2017-03-24 ENCOUNTER — Encounter: Payer: Self-pay | Admitting: Family Medicine

## 2017-03-24 ENCOUNTER — Ambulatory Visit (INDEPENDENT_AMBULATORY_CARE_PROVIDER_SITE_OTHER): Payer: Medicaid Other | Admitting: Family Medicine

## 2017-03-24 ENCOUNTER — Encounter: Admission: RE | Payer: Self-pay | Source: Ambulatory Visit

## 2017-03-24 VITALS — BP 99/70 | HR 83 | Temp 99.2°F | Wt 156.1 lb

## 2017-03-24 DIAGNOSIS — G8929 Other chronic pain: Secondary | ICD-10-CM

## 2017-03-24 DIAGNOSIS — M5441 Lumbago with sciatica, right side: Secondary | ICD-10-CM

## 2017-03-24 SURGERY — ESOPHAGOGASTRODUODENOSCOPY (EGD) WITH PROPOFOL
Anesthesia: General

## 2017-03-24 MED ORDER — OXYCODONE-ACETAMINOPHEN 5-325 MG PO TABS: 1.0000 | ORAL_TABLET | Freq: Four times a day (QID) | ORAL | 0 refills | Status: DC | PRN

## 2017-03-24 NOTE — Progress Notes (Signed)
BP 99/70 (BP Location: Left Arm, Patient Position: Sitting, Cuff Size: Normal)   Pulse 83   Temp 99.2 F (37.3 C) (Tympanic)   Wt 156 lb 1.6 oz (70.8 kg)   SpO2 99%   BMI 27.65 kg/m    Subjective:    Patient ID: Cassandra Duarte, female    DOB: 1985-11-26, 32 y.o.   MRN: [id number]  HPI: Cassandra Duarte is a 32 y.o. female  Chief Complaint  Patient presents with  . Back Pain   Pt here today following up on her chronic back pain. Was seen by a Neurosurgeon last week and wanting a second opinion as no viable options for treatment were presented to her. Currently on flexeril and percocet for bedtime with poor pain control most days. Still having radicular sxs down right leg, weakness. Denies fevers, chills, incontinence, saddle paresthesias. Last MRI was 02/2017.   Relevant past medical, surgical, family and social history reviewed and updated as indicated. Interim medical history since our last visit reviewed. Allergies and medications reviewed and updated.  Review of Systems  Per HPI unless specifically indicated above     Objective:    BP 99/70 (BP Location: Left Arm, Patient Position: Sitting, Cuff Size: Normal)   Pulse 83   Temp 99.2 F (37.3 C) (Tympanic)   Wt 156 lb 1.6 oz (70.8 kg)   SpO2 99%   BMI 27.65 kg/m   Wt Readings from Last 3 Encounters:  03/24/17 156 lb 1.6 oz (70.8 kg)  03/03/17 155 lb (70.3 kg)  02/28/17 148 lb (67.1 kg)    Physical Exam  Constitutional: She is oriented to person, place, and time. She appears well-developed and well-nourished. No distress.  HENT:  Head: Atraumatic.  Eyes: Conjunctivae are normal. Pupils are equal, round, and reactive to light.  Neck: Normal range of motion. Neck supple.  Cardiovascular: Normal rate and normal heart sounds.  Pulmonary/Chest: Effort normal and breath sounds normal. No respiratory distress.  Musculoskeletal: Normal range of motion.  Neurological: She is alert and oriented to person,  place, and time.  Skin: Skin is warm and dry.  Psychiatric: She has a normal mood and affect.  Nursing note and vitals reviewed.   Results for orders placed or performed during the hospital encounter of 03/03/17  VITAMIN D 25 Hydroxy (Vit-D Deficiency, Fractures)  Result Value Ref Range   Vit D, 25-Hydroxy 14.3 (L) 30.0 - 100.0 ng/mL  Iron and TIBC  Result Value Ref Range   Iron 62 28 - 170 ug/dL   TIBC 326 250 - 450 ug/dL   Saturation Ratios 19 10.4 - 31.8 %   UIBC 264 ug/dL  Ferritin  Result Value Ref Range   Ferritin 65 11 - 307 ng/mL  Miscellaneous [facility] test (send-out)  Result Value Ref Range   [facility] test code [id number]    [facility] test name CELIAC AB TTG DGP TIGA    Misc [facility] result COMMENT       Assessment & Plan:   Problem List Items Addressed This Visit      Nervous and Auditory   Chronic low back pain with right-sided sciatica - Primary    Will refer to a different Neurosurgeon for second opinion per pt request, and also refer to pain management for injection consultation and/or oral pain supplementation as pt aware I cannot prescribe extended courses. Small amount given today to bridge her, pt aware she needs to make it last      Relevant Medications  oxyCODONE-acetaminophen (PERCOCET/ROXICET) 5-325 MG tablet   Other Relevant Orders   Ambulatory referral to Neurosurgery   Ambulatory referral to Pain Clinic       Follow up plan: Return if symptoms worsen or fail to improve.

## 2017-03-24 NOTE — Patient Instructions (Addendum)
Gabapentin capsules or tablets What is this medicine? GABAPENTIN (GA ba pen tin) is used to control partial seizures in adults with epilepsy. It is also used to treat certain types of nerve pain. This medicine may be used for other purposes; ask your health care provider or pharmacist if you have questions. COMMON BRAND NAME(S): Active-PAC with Gabapentin, Gabarone, Neurontin What should I tell my health care provider before I take this medicine? They need to know if you have any of these conditions: -kidney disease -suicidal thoughts, plans, or attempt; a previous suicide attempt by you or a family member -an unusual or allergic reaction to gabapentin, other medicines, foods, dyes, or preservatives -pregnant or trying to get pregnant -breast-feeding How should I use this medicine? Take this medicine by mouth with a glass of water. Follow the directions on the prescription label. You can take it with or without food. If it upsets your stomach, take it with food.Take your medicine at regular intervals. Do not take it more often than directed. Do not stop taking except on your doctor's advice. If you are directed to break the 600 or 800 mg tablets in half as part of your dose, the extra half tablet should be used for the next dose. If you have not used the extra half tablet within 28 days, it should be thrown away. A special MedGuide will be given to you by the pharmacist with each prescription and refill. Be sure to read this information carefully each time. Talk to your pediatrician regarding the use of this medicine in children. Special care may be needed. Overdosage: If you think you have taken too much of this medicine contact a poison control center or emergency room at once. NOTE: This medicine is only for you. Do not share this medicine with others. What if I miss a dose? If you miss a dose, take it as soon as you can. If it is almost time for your next dose, take only that dose. Do not  take double or extra doses. What may interact with this medicine? Do not take this medicine with any of the following medications: -other gabapentin products This medicine may also interact with the following medications: -alcohol -antacids -antihistamines for allergy, cough and cold -certain medicines for anxiety or sleep -certain medicines for depression or psychotic disturbances -homatropine; hydrocodone -naproxen -narcotic medicines (opiates) for pain -phenothiazines like chlorpromazine, mesoridazine, prochlorperazine, thioridazine This list may not describe all possible interactions. Give your health care provider a list of all the medicines, herbs, non-prescription drugs, or dietary supplements you use. Also tell them if you smoke, drink alcohol, or use illegal drugs. Some items may interact with your medicine. What should I watch for while using this medicine? Visit your doctor or health care professional for regular checks on your progress. You may want to keep a record at home of how you feel your condition is responding to treatment. You may want to share this information with your doctor or health care professional at each visit. You should contact your doctor or health care professional if your seizures get worse or if you have any new types of seizures. Do not stop taking this medicine or any of your seizure medicines unless instructed by your doctor or health care professional. Stopping your medicine suddenly can increase your seizures or their severity. Wear a medical identification bracelet or chain if you are taking this medicine for seizures, and carry a card that lists all your medications. You may get drowsy, dizzy,   or have blurred vision. Do not drive, use machinery, or do anything that needs mental alertness until you know how this medicine affects you. To reduce dizzy or fainting spells, do not sit or stand up quickly, especially if you are an older patient. Alcohol can  increase drowsiness and dizziness. Avoid alcoholic drinks. Your mouth may get dry. Chewing sugarless gum or sucking hard candy, and drinking plenty of water will help. The use of this medicine may increase the chance of suicidal thoughts or actions. Pay special attention to how you are responding while on this medicine. Any worsening of mood, or thoughts of suicide or dying should be reported to your health care professional right away. Women who become pregnant while using this medicine may enroll in the [place] Pregnancy Registry by calling [phone]. This registry collects information about the safety of antiepileptic drug use during pregnancy. What side effects may I notice from receiving this medicine? Side effects that you should report to your doctor or health care professional as soon as possible: -allergic reactions like skin rash, itching or hives, swelling of the face, lips, or tongue -worsening of mood, thoughts or actions of suicide or dying Side effects that usually do not require medical attention (report to your doctor or health care professional if they continue or are bothersome): -constipation -difficulty walking or controlling muscle movements -dizziness -nausea -slurred speech -tiredness -tremors -weight gain This list may not describe all possible side effects. Call your doctor for medical advice about side effects. You may report side effects to FDA at 1-800-FDA-1088. Where should I keep my medicine? Keep out of reach of children. This medicine may cause accidental overdose and death if it taken by other adults, children, or pets. Mix any unused medicine with a substance like cat litter or coffee grounds. Then throw the medicine away in a sealed container like a sealed bag or a coffee can with a lid. Do not use the medicine after the expiration date. Store at room temperature between 15 and 30 degrees C (59 and 86 degrees F). NOTE: This  sheet is a summary. It may not cover all possible information. If you have questions about this medicine, talk to your doctor, pharmacist, or health care provider.  2018 Elsevier/Gold Standard (2013-03-02 15:26:50) Duloxetine delayed-release capsules What is this medicine? DULOXETINE (doo LOX e teen) is used to treat depression, anxiety, and different types of chronic pain. This medicine may be used for other purposes; ask your health care provider or pharmacist if you have questions. COMMON BRAND NAME(S): Cymbalta, Irenka What should I tell my health care provider before I take this medicine? They need to know if you have any of these conditions: -bipolar disorder or a family history of bipolar disorder -glaucoma -kidney disease -liver disease -suicidal thoughts or a previous suicide attempt -taken medicines called MAOIs like Carbex, Eldepryl, Marplan, Nardil, and Parnate within 14 days -an unusual reaction to duloxetine, other medicines, foods, dyes, or preservatives -pregnant or trying to get pregnant -breast-feeding How should I use this medicine? Take this medicine by mouth with a glass of water. Follow the directions on the prescription label. Do not cut, crush or chew this medicine. You can take this medicine with or without food. Take your medicine at regular intervals. Do not take your medicine more often than directed. Do not stop taking this medicine suddenly except upon the advice of your doctor. Stopping this medicine too quickly may cause serious side effects or your condition may  worsen. A special MedGuide will be given to you by the pharmacist with each prescription and refill. Be sure to read this information carefully each time. Talk to your pediatrician regarding the use of this medicine in children. While this drug may be prescribed for children as young as 61 years of age for selected conditions, precautions do apply. Overdosage: If you think you have taken too much of this  medicine contact a poison control center or emergency room at once. NOTE: This medicine is only for you. Do not share this medicine with others. What if I miss a dose? If you miss a dose, take it as soon as you can. If it is almost time for your next dose, take only that dose. Do not take double or extra doses. What may interact with this medicine? Do not take this medicine with any of the following medications: -desvenlafaxine -levomilnacipran -linezolid -MAOIs like Carbex, Eldepryl, Marplan, Nardil, and Parnate -methylene blue (injected into a vein) -milnacipran -thioridazine -venlafaxine This medicine may also interact with the following medications: -alcohol -amphetamines -aspirin and aspirin-like medicines -certain antibiotics like ciprofloxacin and enoxacin -certain medicines for blood pressure, heart disease, irregular heart beat -certain medicines for depression, anxiety, or psychotic disturbances -certain medicines for migraine headache like almotriptan, eletriptan, frovatriptan, naratriptan, rizatriptan, sumatriptan, zolmitriptan -certain medicines that treat or prevent blood clots like warfarin, enoxaparin, and dalteparin -cimetidine -fentanyl -lithium -NSAIDS, medicines for pain and inflammation, like ibuprofen or naproxen -phentermine -procarbazine -rasagiline -sibutramine -St. John's wort -theophylline -tramadol -tryptophan This list may not describe all possible interactions. Give your health care provider a list of all the medicines, herbs, non-prescription drugs, or dietary supplements you use. Also tell them if you smoke, drink alcohol, or use illegal drugs. Some items may interact with your medicine. What should I watch for while using this medicine? Tell your doctor if your symptoms do not get better or if they get worse. Visit your doctor or health care professional for regular checks on your progress. Because it may take several weeks to see the full effects  of this medicine, it is important to continue your treatment as prescribed by your doctor. Patients and their families should watch out for new or worsening thoughts of suicide or depression. Also watch out for sudden changes in feelings such as feeling anxious, agitated, panicky, irritable, hostile, aggressive, impulsive, severely restless, overly excited and hyperactive, or not being able to sleep. If this happens, especially at the beginning of treatment or after a change in dose, call your health care professional. Dennis Bast may get drowsy or dizzy. Do not drive, use machinery, or do anything that needs mental alertness until you know how this medicine affects you. Do not stand or sit up quickly, especially if you are an older patient. This reduces the risk of dizzy or fainting spells. Alcohol may interfere with the effect of this medicine. Avoid alcoholic drinks. This medicine can cause an increase in blood pressure. This medicine can also cause a sudden drop in your blood pressure, which may make you feel faint and increase the chance of a fall. These effects are most common when you first start the medicine or when the dose is increased, or during use of other medicines that can cause a sudden drop in blood pressure. Check with your doctor for instructions on monitoring your blood pressure while taking this medicine. Your mouth may get dry. Chewing sugarless gum or sucking hard candy, and drinking plenty of water may help. Contact your doctor if  the problem does not go away or is severe. What side effects may I notice from receiving this medicine? Side effects that you should report to your doctor or health care professional as soon as possible: -allergic reactions like skin rash, itching or hives, swelling of the face, lips, or tongue -anxious -breathing problems -confusion -changes in vision -chest pain -confusion -elevated mood, decreased need for sleep, racing thoughts, impulsive behavior -eye  pain -fast, irregular heartbeat -feeling faint or lightheaded, falls -feeling agitated, angry, or irritable -hallucination, loss of contact with reality -high blood pressure -loss of balance or coordination -palpitations -redness, blistering, peeling or loosening of the skin, including inside the mouth -restlessness, pacing, inability to keep still -seizures -stiff muscles -suicidal thoughts or other mood changes -trouble passing urine or change in the amount of urine -trouble sleeping -unusual bleeding or bruising -unusually weak or tired -vomiting -yellowing of the eyes or skin Side effects that usually do not require medical attention (report to your doctor or health care professional if they continue or are bothersome): -change in sex drive or performance -change in appetite or weight -constipation -dizziness -dry mouth -headache -increased sweating -nausea -tired This list may not describe all possible side effects. Call your doctor for medical advice about side effects. You may report side effects to FDA at 1-800-FDA-1088. Where should I keep my medicine? Keep out of the reach of children. Store at room temperature between 20 and 25 degrees C (68 to 77 degrees F). Throw away any unused medicine after the expiration date. NOTE: This sheet is a summary. It may not cover all possible information. If you have questions about this medicine, talk to your doctor, pharmacist, or health care provider.  2018 Elsevier/Gold Standard (2015-06-05 18:16:03) Amitriptyline tablets What is this medicine? AMITRIPTYLINE (a mee TRIP ti leen) is used to treat depression. This medicine may be used for other purposes; ask your health care provider or pharmacist if you have questions. COMMON BRAND NAME(S): Elavil, Vanatrip What should I tell my health care provider before I take this medicine? They need to know if you have any of these conditions: -an alcohol problem -asthma, difficulty  breathing -bipolar disorder or schizophrenia -difficulty passing urine, prostate trouble -glaucoma -heart disease or previous heart attack -liver disease -over active thyroid -seizures -thoughts or plans of suicide, a previous suicide attempt, or family history of suicide attempt -an unusual or allergic reaction to amitriptyline, other medicines, foods, dyes, or preservatives -pregnant or trying to get pregnant -breast-feeding How should I use this medicine? Take this medicine by mouth with a drink of water. Follow the directions on the prescription label. You can take the tablets with or without food. Take your medicine at regular intervals. Do not take it more often than directed. Do not stop taking this medicine suddenly except upon the advice of your doctor. Stopping this medicine too quickly may cause serious side effects or your condition may worsen. A special MedGuide will be given to you by the pharmacist with each prescription and refill. Be sure to read this information carefully each time. Talk to your pediatrician regarding the use of this medicine in children. Special care may be needed. Overdosage: If you think you have taken too much of this medicine contact a poison control center or emergency room at once. NOTE: This medicine is only for you. Do not share this medicine with others. What if I miss a dose? If you miss a dose, take it as soon as you can. If it  is almost time for your next dose, take only that dose. Do not take double or extra doses. What may interact with this medicine? Do not take this medicine with any of the following medications: -arsenic trioxide -certain medicines used to regulate abnormal heartbeat or to treat other heart conditions -cisapride -droperidol -halofantrine -linezolid -MAOIs like Carbex, Eldepryl, Marplan, Nardil, and Parnate -methylene blue -other medicines for mental depression -phenothiazines like perphenazine, thioridazine and  chlorpromazine -pimozide -probucol -procarbazine -sparfloxacin -St. John's Wort -ziprasidone This medicine may also interact with the following medications: -atropine and related drugs like hyoscyamine, scopolamine, tolterodine and others -barbiturate medicines for inducing sleep or treating seizures, like phenobarbital -cimetidine -disulfiram -ethchlorvynol -thyroid hormones such as levothyroxine This list may not describe all possible interactions. Give your health care provider a list of all the medicines, herbs, non-prescription drugs, or dietary supplements you use. Also tell them if you smoke, drink alcohol, or use illegal drugs. Some items may interact with your medicine. What should I watch for while using this medicine? Tell your doctor if your symptoms do not get better or if they get worse. Visit your doctor or health care professional for regular checks on your progress. Because it may take several weeks to see the full effects of this medicine, it is important to continue your treatment as prescribed by your doctor. Patients and their families should watch out for new or worsening thoughts of suicide or depression. Also watch out for sudden changes in feelings such as feeling anxious, agitated, panicky, irritable, hostile, aggressive, impulsive, severely restless, overly excited and hyperactive, or not being able to sleep. If this happens, especially at the beginning of treatment or after a change in dose, call your health care professional. Dennis Bast may get drowsy or dizzy. Do not drive, use machinery, or do anything that needs mental alertness until you know how this medicine affects you. Do not stand or sit up quickly, especially if you are an older patient. This reduces the risk of dizzy or fainting spells. Alcohol may interfere with the effect of this medicine. Avoid alcoholic drinks. Do not treat yourself for coughs, colds, or allergies without asking your doctor or health care  professional for advice. Some ingredients can increase possible side effects. Your mouth may get dry. Chewing sugarless gum or sucking hard candy, and drinking plenty of water will help. Contact your doctor if the problem does not go away or is severe. This medicine may cause dry eyes and blurred vision. If you wear contact lenses you may feel some discomfort. Lubricating drops may help. See your eye doctor if the problem does not go away or is severe. This medicine can cause constipation. Try to have a bowel movement at least every 2 to 3 days. If you do not have a bowel movement for 3 days, call your doctor or health care professional. This medicine can make you more sensitive to the sun. Keep out of the sun. If you cannot avoid being in the sun, wear protective clothing and use sunscreen. Do not use sun lamps or tanning beds/booths. What side effects may I notice from receiving this medicine? Side effects that you should report to your doctor or health care professional as soon as possible: -allergic reactions like skin rash, itching or hives, swelling of the face, lips, or tongue -anxious -breathing problems -changes in vision -confusion -elevated mood, decreased need for sleep, racing thoughts, impulsive behavior -eye pain -fast, irregular heartbeat -feeling faint or lightheaded, falls -feeling agitated, angry, or irritable -  fever with increased sweating -hallucination, loss of contact with reality -seizures -stiff muscles -suicidal thoughts or other mood changes -tingling, pain, or numbness in the feet or hands -trouble passing urine or change in the amount of urine -trouble sleeping -unusually weak or tired -vomiting -yellowing of the eyes or skin Side effects that usually do not require medical attention (report to your doctor or health care professional if they continue or are bothersome): -change in sex drive or performance -change in appetite or  weight -constipation -dizziness -dry mouth -nausea -tired -tremors -upset stomach This list may not describe all possible side effects. Call your doctor for medical advice about side effects. You may report side effects to FDA at 1-800-FDA-1088. Where should I keep my medicine? Keep out of the reach of children. Store at room temperature between 20 and 25 degrees C (68 and 77 degrees F). Throw away any unused medicine after the expiration date. NOTE: This sheet is a summary. It may not cover all possible information. If you have questions about this medicine, talk to your doctor, pharmacist, or health care provider.  2018 Elsevier/Gold Standard (2015-06-06 12:14:15)

## 2017-03-27 NOTE — Assessment & Plan Note (Signed)
Will refer to a different Neurosurgeon for second opinion per pt request, and also refer to pain management for injection consultation and/or oral pain supplementation as pt aware I cannot prescribe extended courses. Small amount given today to bridge her, pt aware she needs to make it last

## 2017-03-29 ENCOUNTER — Telehealth: Payer: Self-pay | Admitting: Family Medicine

## 2017-03-29 NOTE — Telephone Encounter (Signed)
Copied from [place]. Topic: Quick Communication - Rx Refill/Question >> Mar 29, 2017  2:37 PM Margot Ables wrote: Medication: pt states the oxycodone made her nauseous. She is requesting zofran for nausea when she takes her pain meds. Preferred Pharmacy (with phone number or street name): [place], [place] [phone] (Phone) [phone] (Fax)

## 2017-03-29 NOTE — Telephone Encounter (Signed)
Routing to correct office

## 2017-03-30 MED ORDER — ONDANSETRON 4 MG PO TBDP: 4.0000 mg | ORAL_TABLET | Freq: Three times a day (TID) | ORAL | 0 refills | Status: DC | PRN

## 2017-03-30 NOTE — Telephone Encounter (Signed)
rx sent

## 2017-04-07 ENCOUNTER — Telehealth: Payer: Self-pay | Admitting: Family Medicine

## 2017-04-07 NOTE — Telephone Encounter (Signed)
Copied from [place] [phone]. Topic: Inquiry >> Apr 07, 2017  1:56 PM Margot Ables wrote: Reason for CRM: Pt referred to pain clinic and they cannot see her until 4/23. She was advised to ask Apolonio Schneiders for pain meds (oxycodone) for another month. She has enough for about a week. Please advise.   [place], [place] [phone] (Phone) [phone] (Fax)

## 2017-04-08 ENCOUNTER — Other Ambulatory Visit: Payer: Self-pay | Admitting: Family Medicine

## 2017-04-08 MED ORDER — OXYCODONE-ACETAMINOPHEN 5-325 MG PO TABS: 1.0000 | ORAL_TABLET | Freq: Four times a day (QID) | ORAL | 0 refills | Status: DC | PRN

## 2017-04-08 NOTE — Telephone Encounter (Signed)
Patient notified. Stated she will pick medication up Monday 04/11/2017

## 2017-04-08 NOTE — Progress Notes (Signed)
Printed script to be filled in a week since she should be able to stretch them. She will need to make these last until her appt

## 2017-04-08 NOTE — Progress Notes (Signed)
Patient notified. Stated she will pick medication up Monday 04/11/2017

## 2017-04-08 NOTE — Telephone Encounter (Signed)
Rx printed and given to Keri to call pt

## 2017-04-11 ENCOUNTER — Telehealth: Payer: Self-pay

## 2017-04-27 ENCOUNTER — Ambulatory Visit (INDEPENDENT_AMBULATORY_CARE_PROVIDER_SITE_OTHER): Payer: Medicaid Other | Admitting: Family Medicine

## 2017-04-27 ENCOUNTER — Encounter: Payer: Self-pay | Admitting: Family Medicine

## 2017-04-27 VITALS — BP 110/71 | HR 85 | Temp 99.0°F | Ht 63.0 in | Wt 156.9 lb

## 2017-04-27 DIAGNOSIS — R319 Hematuria, unspecified: Secondary | ICD-10-CM

## 2017-04-27 DIAGNOSIS — N3001 Acute cystitis with hematuria: Secondary | ICD-10-CM

## 2017-04-27 MED ORDER — FLUCONAZOLE 150 MG PO TABS: 150.0000 mg | ORAL_TABLET | Freq: Once | ORAL | 0 refills | Status: AC

## 2017-04-27 MED ORDER — OXYCODONE-ACETAMINOPHEN 5-325 MG PO TABS: 1.0000 | ORAL_TABLET | Freq: Three times a day (TID) | ORAL | 0 refills | Status: DC | PRN

## 2017-04-27 MED ORDER — TAMSULOSIN HCL 0.4 MG PO CAPS: 0.4000 mg | ORAL_CAPSULE | Freq: Every day | ORAL | 0 refills | Status: DC

## 2017-04-27 NOTE — Progress Notes (Signed)
BP 110/71   Pulse 85   Temp 99 F (37.2 C) (Oral)   Ht 5\' 3"  (1.6 m)   Wt 156 lb 14.4 oz (71.2 kg)   BMI 27.79 kg/m    Subjective:    Patient ID: Cassandra Duarte, female    DOB: 11/10/1985, 32 y.o.   MRN: [id number]  HPI: Cassandra Duarte is a 32 y.o. female  Chief Complaint  Patient presents with  . Urinary Tract Infection    pt states she thinks she may have a kidney infection, states she has had burning, frequency, and pressure for 2 weeks    Urinary frequency, pressure, and low back pain/cramping,nausea x 2 weeks, now moving up into flank pain on the right. Went to UC on Monday and started on cipro for a UTI but not improved. Pain in back is now sharp, feels like kidney stones she's had in the past. Pain is worst when she has to urinate, slightly relieved after urinating. Last stone was about 7 years ago, had to have lithotripsy at that time. Denies fevers, vomiting, diarrhea, vaginal discharge, concern for STIs. S/p tubal ligation. Taking OTC pain relievers, has been out of her oxycodone for her back pain.   Past Medical History:  Diagnosis Date  . Vaginal Pap smear, abnormal 2012   REPEAT AND WAS NORMAL   Social History   Socioeconomic History  . Marital status: Single    Spouse name: Not on file  . Number of children: Not on file  . Years of education: Not on file  . Highest education level: Not on file  Occupational History  . Not on file  Social Needs  . Financial resource strain: Not on file  . Food insecurity:    Worry: Not on file    Inability: Not on file  . Transportation needs:    Medical: Not on file    Non-medical: Not on file  Tobacco Use  . Smoking status: Former Smoker    Packs/day: 0.50    Years: 4.00    Pack years: 2.00    Types: Cigarettes    Last attempt to quit: 04/02/2014    Years since quitting: 3.0  . Smokeless tobacco: Never Used  Substance and Sexual Activity  . Alcohol use: No    Alcohol/week: 0.0 oz  . Drug use: No    . Sexual activity: Yes    Birth control/protection: None, Surgical  Lifestyle  . Physical activity:    Days per week: Not on file    Minutes per session: Not on file  . Stress: Not on file  Relationships  . Social connections:    Talks on phone: Not on file    Gets together: Not on file    Attends religious service: Not on file    Active member of club or organization: Not on file    Attends meetings of clubs or organizations: Not on file    Relationship status: Not on file  . Intimate partner violence:    Fear of current or ex partner: Not on file    Emotionally abused: Not on file    Physically abused: Not on file    Forced sexual activity: Not on file  Other Topics Concern  . Not on file  Social History Narrative  . Not on file    Relevant past medical, surgical, family and social history reviewed and updated as indicated. Interim medical history since our last visit reviewed. Allergies and medications reviewed and  updated.  Review of Systems  Per HPI unless specifically indicated above     Objective:    BP 110/71   Pulse 85   Temp 99 F (37.2 C) (Oral)   Ht 5\' 3"  (1.6 m)   Wt 156 lb 14.4 oz (71.2 kg)   BMI 27.79 kg/m   Wt Readings from Last 3 Encounters:  04/28/17 155 lb (70.3 kg)  04/27/17 156 lb 14.4 oz (71.2 kg)  03/24/17 156 lb 1.6 oz (70.8 kg)    Physical Exam  Constitutional: She is oriented to person, place, and time. She appears well-developed and well-nourished. No distress.  HENT:  Head: Atraumatic.  Eyes: Pupils are equal, round, and reactive to light.  Neck: Normal range of motion. Neck supple.  Cardiovascular: Normal rate and regular rhythm.  Pulmonary/Chest: Effort normal and breath sounds normal. No respiratory distress.  Abdominal: Soft. Bowel sounds are normal. She exhibits no distension. There is tenderness (generalized, slightly worse on the right flank). There is no rebound and no guarding.  Musculoskeletal: Normal range of motion.  She exhibits no tenderness (No CVA tenderness b/l).  Neurological: She is alert and oriented to person, place, and time.  Skin: Skin is warm and dry.  Psychiatric: She has a normal mood and affect. Her behavior is normal.  Nursing note and vitals reviewed.   Results for orders placed or performed in visit on 04/27/17  Microscopic Examination  Result Value Ref Range   WBC, UA 0-5 0 - 5 /hpf   RBC, UA 3-10 (A) 0 - 2 /hpf   Epithelial Cells (non renal) >10 (A) 0 - 10 /hpf   Mucus, UA Present (A) Not Estab.   Bacteria, UA Many (A) None seen/Few  Urine Culture, Reflex  Result Value Ref Range   Urine Culture, Routine Final report    Organism ID, Bacteria No growth   UA/M w/rflx Culture, Routine  Result Value Ref Range   Specific Gravity, UA 1.025 1.005 - 1.030   pH, UA 5.5 5.0 - 7.5   Color, UA Yellow Yellow   Appearance Ur Slightly cloudy Clear   Leukocytes, UA Negative Negative   Protein, UA Negative Negative/Trace   Glucose, UA Negative Negative   Ketones, UA Negative Negative   RBC, UA 2+ (A) Negative   Bilirubin, UA Negative Negative   Urobilinogen, Ur 1.0 0.2 - 1.0 mg/dL   Nitrite, UA Negative Negative   Microscopic Examination See below:    Urinalysis Reflex Comment   Pregnancy, urine  Result Value Ref Range   Preg Test, Ur Negative Negative      Assessment & Plan:   Problem List Items Addressed This Visit    None    Visit Diagnoses    Hematuria, unspecified type    -  Primary   Given sxs, history of stones, and RBCs in U/A, suspect kidney stone. Pt wanting to hold off on CT scan for a few more days. Flomax, oxycodone prn, push fluids   Relevant Orders   UA/M w/rflx Culture, Routine (Completed)   Pregnancy, urine (Completed)   Acute cystitis with hematuria       On cipro from UC, U/A today with 2+ RBCs but neg for leuks or nitrites. Complete cipro, push fluids.     Sedation and addiction precautions reviewed with narcotic pain medication. OTC pain relievers and  muscle relaxers prn. Strict return precautions reviewed with pt.   Follow up plan: Return if symptoms worsen or fail to improve.

## 2017-04-28 ENCOUNTER — Encounter: Payer: Self-pay | Admitting: *Deleted

## 2017-04-28 ENCOUNTER — Other Ambulatory Visit: Payer: Self-pay

## 2017-04-28 DIAGNOSIS — Z87891 Personal history of nicotine dependence: Secondary | ICD-10-CM | POA: Diagnosis not present

## 2017-04-28 DIAGNOSIS — R1031 Right lower quadrant pain: Secondary | ICD-10-CM | POA: Diagnosis present

## 2017-04-28 DIAGNOSIS — Z79899 Other long term (current) drug therapy: Secondary | ICD-10-CM | POA: Diagnosis not present

## 2017-04-28 LAB — CBC
HEMATOCRIT: 38.1 % (ref 35.0–47.0)
HEMOGLOBIN: 13.4 g/dL (ref 12.0–16.0)
MCH: 30.6 pg (ref 26.0–34.0)
MCHC: 35.1 g/dL (ref 32.0–36.0)
MCV: 87 fL (ref 80.0–100.0)
Platelets: 181 10*3/uL (ref 150–440)
RBC: 4.38 MIL/uL (ref 3.80–5.20)
RDW: 12.7 % (ref 11.5–14.5)
WBC: 8.4 10*3/uL (ref 3.6–11.0)

## 2017-04-28 NOTE — ED Triage Notes (Signed)
Pt to ED reporting right sided abd pain and flank pain. Pt was seen at PCP on Wednesday and started on Cipro for an UTI that did not improve symptoms. Pt called PCP and was told there was blood in her urine and they started her on flomax to help pass a potential stone. Pt to ED reporting pain has worsened and now radiating to right side of abd with vaginal bleeding that is brown and "purple" in nature. Pt reports that is not normal vaginal bleeding for her.   Pt denies diarrhea or changes in BM but reports new onset of nausea, no vomiting. No fevers.

## 2017-04-29 ENCOUNTER — Emergency Department
Admission: EM | Admit: 2017-04-29 | Discharge: 2017-04-29 | Disposition: A | Discharge status: Home / Self Care | Payer: Medicaid Other | Attending: Emergency Medicine | Admitting: Emergency Medicine

## 2017-04-29 ENCOUNTER — Emergency Department: Payer: Medicaid Other

## 2017-04-29 ENCOUNTER — Encounter: Payer: Self-pay | Admitting: Family Medicine

## 2017-04-29 ENCOUNTER — Other Ambulatory Visit: Payer: Self-pay | Admitting: Family Medicine

## 2017-04-29 ENCOUNTER — Telehealth: Payer: Self-pay | Admitting: Family Medicine

## 2017-04-29 DIAGNOSIS — R109 Unspecified abdominal pain: Secondary | ICD-10-CM

## 2017-04-29 LAB — COMPREHENSIVE METABOLIC PANEL
ALBUMIN: 4.3 g/dL (ref 3.5–5.0)
ALT: 43 U/L (ref 14–54)
AST: 33 U/L (ref 15–41)
Alkaline Phosphatase: 68 U/L (ref 38–126)
Anion gap: 5 (ref 5–15)
BILIRUBIN TOTAL: 0.6 mg/dL (ref 0.3–1.2)
BUN: 17 mg/dL (ref 6–20)
CALCIUM: 8.8 mg/dL — AB (ref 8.9–10.3)
CHLORIDE: 106 mmol/L (ref 101–111)
CO2: 27 mmol/L (ref 22–32)
Creatinine, Ser: 0.86 mg/dL (ref 0.44–1.00)
GFR calc non Af Amer: >60 mL/min (ref >60)
Glucose, Bld: 100 mg/dL — ABNORMAL HIGH (ref 65–99)
POTASSIUM: 3.7 mmol/L (ref 3.5–5.1)
Sodium: 138 mmol/L (ref 135–145)
Total Protein: 7.3 g/dL (ref 6.5–8.1)

## 2017-04-29 LAB — URINALYSIS, COMPLETE (UACMP) WITH MICROSCOPIC
BILIRUBIN URINE: NEGATIVE
Glucose, UA: NEGATIVE mg/dL
KETONES UR: NEGATIVE mg/dL
Leukocytes, UA: NEGATIVE
NITRITE: NEGATIVE
Protein, ur: NEGATIVE mg/dL
SPECIFIC GRAVITY, URINE: 1.026 (ref 1.005–1.030)
pH: 8 (ref 5.0–8.0)

## 2017-04-29 LAB — UA/M W/RFLX CULTURE, ROUTINE
BILIRUBIN UA: NEGATIVE
GLUCOSE, UA: NEGATIVE
KETONES UA: NEGATIVE
Leukocytes, UA: NEGATIVE
Nitrite, UA: NEGATIVE
PROTEIN UA: NEGATIVE
SPEC GRAV UA: 1.025 (ref 1.005–1.030)
UUROB: 1 mg/dL (ref 0.2–1.0)
pH, UA: 5.5 (ref 5.0–7.5)

## 2017-04-29 LAB — MICROSCOPIC EXAMINATION: Epithelial Cells (non renal): >10 /hpf — AB (ref 0–10)

## 2017-04-29 LAB — POC URINE PREG, ED: PREG TEST UR: NEGATIVE

## 2017-04-29 LAB — URINE CULTURE, REFLEX: Organism ID, Bacteria: NO GROWTH

## 2017-04-29 LAB — LIPASE, BLOOD: Lipase: 29 U/L (ref 11–51)

## 2017-04-29 LAB — PREGNANCY, URINE: Preg Test, Ur: NEGATIVE

## 2017-04-29 MED ORDER — CYCLOBENZAPRINE HCL 10 MG PO TABS
5.0000 mg | ORAL_TABLET | Freq: Once | ORAL | Status: AC
  Administered 2017-04-29: 5 mg via ORAL
  Filled 2017-04-29: qty 1

## 2017-04-29 MED ORDER — CYCLOBENZAPRINE HCL 10 MG PO TABS: 10.0000 mg | ORAL_TABLET | Freq: Three times a day (TID) | ORAL | 0 refills | Status: DC | PRN

## 2017-04-29 MED ORDER — DOXYCYCLINE HYCLATE 100 MG PO TABS: 100.0000 mg | ORAL_TABLET | Freq: Two times a day (BID) | ORAL | 0 refills | Status: DC

## 2017-04-29 MED ORDER — ONDANSETRON HCL 4 MG/2ML IJ SOLN
4.0000 mg | Freq: Once | INTRAMUSCULAR | Status: AC
  Administered 2017-04-29: 4 mg via INTRAVENOUS
  Filled 2017-04-29: qty 2

## 2017-04-29 MED ORDER — MORPHINE SULFATE (PF) 4 MG/ML IV SOLN
4.0000 mg | Freq: Once | INTRAVENOUS | Status: AC
  Administered 2017-04-29: 4 mg via INTRAVENOUS
  Filled 2017-04-29: qty 1

## 2017-04-29 MED ORDER — ONDANSETRON 4 MG PO TBDP: 4.0000 mg | ORAL_TABLET | Freq: Three times a day (TID) | ORAL | 0 refills | Status: DC | PRN

## 2017-04-29 MED ORDER — KETOROLAC TROMETHAMINE 30 MG/ML IJ SOLN
30.0000 mg | Freq: Once | INTRAMUSCULAR | Status: AC
  Administered 2017-04-29: 30 mg via INTRAVENOUS

## 2017-04-29 MED ORDER — KETOROLAC TROMETHAMINE 30 MG/ML IJ SOLN
INTRAMUSCULAR | Status: AC
  Administered 2017-04-29: 30 mg via INTRAVENOUS
  Filled 2017-04-29: qty 1

## 2017-04-29 NOTE — ED Notes (Signed)
Pt with R flank pain x 2 weeks. States tonight it was so bad that she was unable to sleep and has tried many different medications and has seen her regular doctor without relief

## 2017-04-29 NOTE — ED Provider Notes (Signed)
[facility] Emergency Department Provider Note    First MD Initiated Contact with Patient 04/29/17 0217     (approximate)  I have reviewed the triage vital signs and the nursing notes.   HISTORY  Chief Complaint Flank Pain and Abdominal Pain    HPI Cassandra Duarte is a 32 y.o. female presents to the emergency department with 2-1/2-week history of right sided abdominal/flank pain.  Patient denies any aggravating or alleviating factors.  Patient states her current pain score is 9 out of 10.  Patient states that she was seen by her primary care provider on Wednesday and diagnosed with a urinary tract infection for which she was prescribed Cipro however pain is persisted.  Patient states that she was notified by primary care provider that there was blood in her urine and as such was concern for possible kidney stone for which they prescribed Flomax.  Patient denies any fever no dysuria.  Patient afebrile on presentation temperature 98.7.  Nausea vomiting or diarrhea.   Past Medical History:  Diagnosis Date  . Vaginal Pap smear, abnormal 2012   REPEAT AND WAS NORMAL    Patient Active Problem List   Diagnosis Date Noted  . Overweight 10/01/2015  . Lactational amenorrhea 10/01/2015  . Family history of breast cancer 10/01/2015  . 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 DILATION AND CURETTAGE OF UTERUS  2014   WS  . TUBAL LIGATION Bilateral 02/21/2015   Procedure: BILATERAL TUBAL LIGATION;  Surgeon: Brayton Mars, MD;  Location: [facility] ORS;  Service: Gynecology;  Laterality: Bilateral;    Prior to Admission medications   Medication Sig Start Date End Date Taking? Authorizing Provider  albuterol (PROVENTIL HFA;VENTOLIN HFA) 108 (90 Base) MCG/ACT inhaler Inhale 2 puffs into the lungs every 6 (six) hours as needed for wheezing or shortness of breath. Patient not taking:  Reported on 03/03/2017 02/23/17   Laban Emperor, PA-C  ciprofloxacin (CIPRO) 500 MG tablet take 1 tablet by mouth twice a day for 7 days 04/25/17   [provider]  cyclobenzaprine (FLEXERIL) 10 MG tablet Take 1 tablet (10 mg total) by mouth 3 (three) times daily as needed for muscle spasms. Patient not taking: Reported on 04/27/2017 02/17/17   Volney American, PA-C  cyclobenzaprine (FLEXERIL) 10 MG tablet Take 1 tablet (10 mg total) by mouth 3 (three) times daily as needed. 04/29/17   Gregor Hams, MD  dicyclomine (BENTYL) 10 MG capsule Take 1 capsule (10 mg total) by mouth 4 (four) times daily for 5 days. Patient not taking: Reported on 03/03/2017 02/28/17 03/05/17  Loney Hering, MD  famotidine (PEPCID) 40 MG tablet Take 1 tablet (40 mg total) by mouth every evening. Patient not taking: Reported on 03/03/2017 02/28/17 02/28/18  Loney Hering, MD  lidocaine (LIDODERM) 5 % Place 1 patch onto the skin daily. Remove & Discard patch within 12 hours or as directed by MD Patient not taking: Reported on 03/03/2017 02/17/17   Volney American, PA-C  methocarbamol (ROBAXIN) 750 MG tablet Take 750 mg by mouth every 6 (six) hours. 02/10/17   [provider]  metoCLOPramide (REGLAN) 10 MG tablet Take 1 tablet (10 mg total) by mouth every 8 (eight) hours as needed. Patient not taking: Reported on 03/03/2017 02/28/17   Loney Hering, MD  ondansetron (ZOFRAN ODT) 4 MG disintegrating tablet Take 1 tablet (4 mg total) by mouth  every 8 (eight) hours as needed. 03/30/17   Volney American, PA-C  oxyCODONE-acetaminophen (PERCOCET/ROXICET) 5-325 MG tablet Take 1 tablet by mouth every 8 (eight) hours as needed for severe pain. 04/27/17   Volney American, PA-C  tamsulosin (FLOMAX) 0.4 MG CAPS capsule Take 1 capsule (0.4 mg total) by mouth daily. 04/27/17   Volney American, PA-C  Vitamin D, Ergocalciferol, (DRISDOL) 50000 units CAPS capsule Take 1 capsule (50,000 Units  total) by mouth every 7 (seven) days for 12 doses. Patient not taking: Reported on 04/27/2017 03/04/17 05/21/17  Lin Landsman, MD    Allergies Magnesium-containing compounds; Tamiflu [oseltamivir phosphate]; and Tramadol  Family History  Problem Relation Age of Onset  . Breast cancer Paternal Aunt   . Diabetes Paternal Aunt   . Diabetes Maternal Grandfather   . COPD Maternal Grandmother   . Cancer Maternal Grandmother        lung  . Colon cancer Neg Hx   . Ovarian cancer Neg Hx   . Heart disease Neg Hx     Social History Social History   Tobacco Use  . Smoking status: Former Smoker    Packs/day: 0.50    Years: 4.00    Pack years: 2.00    Types: Cigarettes    Last attempt to quit: 04/02/2014    Years since quitting: 3.0  . Smokeless tobacco: Never Used  Substance Use Topics  . Alcohol use: No    Alcohol/week: 0.0 oz  . Drug use: No    Review of Systems Constitutional: No fever/chills Eyes: No visual changes. ENT: No sore throat. Cardiovascular: Denies chest pain. Respiratory: Denies shortness of breath. Gastrointestinal: Positive for right flank pain.  No nausea, no vomiting.  No diarrhea.  No constipation. Genitourinary: Negative for dysuria. Musculoskeletal: Negative for neck pain.  Negative for back pain. Integumentary: Negative for rash. Neurological: Negative for headaches, focal weakness or numbness.   ____________________________________________   PHYSICAL EXAM:  VITAL SIGNS: ED Triage Vitals  Enc Vitals Group     BP 04/28/17 2338 139/86     Pulse Rate 04/28/17 2338 (!) 103     Resp 04/28/17 2338 18     Temp 04/28/17 2338 98.7 F (37.1 C)     Temp Source 04/28/17 2338 Oral     SpO2 04/28/17 2338 98 %     Weight 04/28/17 2339 70.3 kg (155 lb)     Height 04/28/17 2339 1.6 m (5\' 3" )     Head Circumference --      Peak Flow --      Pain Score 04/28/17 2348 8     Pain Loc --      Pain Edu? --      Excl. in [place]? --     Constitutional: Alert  and oriented. Well appearing and in no acute distress. Eyes: Conjunctivae are normal. Head: Atraumatic.Marland Kitchen Mouth/Throat: Mucous membranes are moist.  Oropharynx non-erythematous. Neck: No stridor.   Cardiovascular: Normal rate, regular rhythm. Good peripheral circulation. Grossly normal heart sounds. Respiratory: Normal respiratory effort.  No retractions. Lungs CTAB. Gastrointestinal: Soft and nontender. No distention.  Musculoskeletal: No lower extremity tenderness nor edema. No gross deformities of extremities. Neurologic:  Normal speech and language. No gross focal neurologic deficits are appreciated.  Skin:  Skin is warm, dry and intact. No rash noted. Psychiatric: Mood and affect are normal. Speech and behavior are normal.  ____________________________________________   LABS (all labs ordered are listed, but only abnormal results are displayed)  Labs  Reviewed  COMPREHENSIVE METABOLIC PANEL - Abnormal; Notable for the following components:      Result Value   Glucose, Bld 100 (*)    Calcium 8.8 (*)    All other components within normal limits  URINALYSIS, COMPLETE (UACMP) WITH MICROSCOPIC - Abnormal; Notable for the following components:   Color, Urine YELLOW (*)    APPearance HAZY (*)    Hgb urine dipstick MODERATE (*)    Bacteria, UA RARE (*)    Squamous Epithelial / LPF 0-5 (*)    All other components within normal limits  LIPASE, BLOOD  CBC  POC URINE PREG, ED     RADIOLOGY I,  N BROWN, personally viewed and evaluated these images (plain radiographs) as part of my medical decision making, as well as reviewing the written report by the radiologist.  ED MD interpretation: No acute findings to explain the patient's right flank pain.  Radiologist makes mention of stranding in the pelvis  Official radiology report(s): Ct Renal Stone Study  Result Date: 04/29/2017 CLINICAL DATA:  Right flank pain.  Stone disease suspected. EXAM: CT ABDOMEN AND PELVIS WITHOUT  CONTRAST TECHNIQUE: Multidetector CT imaging of the abdomen and pelvis was performed following the standard protocol without IV contrast. COMPARISON:  CT 02/28/2017 FINDINGS: Lower chest: Mild hypoventilatory change at the lung bases. No consolidation. No pleural fluid. Hepatobiliary: Right lobe liver lesion on prior exam is not as well visualized, the central scar is seen and unchanged. 6 mm subcapsular cyst in the right hepatic lobe. No gallstones, pericholecystic inflammation or biliary dilatation. Gallbladder partially contracted. Pancreas: No ductal dilatation or inflammation. Spleen: Single splenic calcification, unchanged. Spleen is normal in size with small splenules anteriorly. Adrenals/Urinary Tract: Normal adrenal glands. No renal stones or obstructive uropathy. No perinephric edema. No hydronephrosis. Both ureters are decompressed without stones along the course. Urinary bladder is partially distended. No bladder wall thickening or stone. Stomach/Bowel: Stomach is within normal limits. Appendix appears normal. No evidence of bowel wall thickening, distention, or inflammatory changes. Moderate colonic stool burden without colonic wall thickening. Vascular/Lymphatic: Normal course and caliber of abdominal vessels. No enlarged abdominal or pelvic lymph nodes. Reproductive: Uterus is unremarkable. The ovaries are symmetric in size. Mild generalized stranding in the pelvis and low adnexa. No focal fluid collection. Other: No ascites or free air. 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 There are no acute or suspicious osseous abnormalities. IMPRESSION: 1. No renal stone or obstructive uropathy. 2. Generalized stranding about the lower uterus and adnexa which can be seen with pelvic inflammatory disease. No focal fluid collection/abscess. 3. Known liver lesion is not well characterized on current exam given lack contrast, the central scar is  unchanged. Electronically Signed   By: Jeb Levering M.D.   On: 04/29/2017 03:32    ____________________________________________   PROCEDURES  Procedures   ____________________________________________   INITIAL IMPRESSION / ASSESSMENT AND PLAN / ED COURSE  As part of my medical decision making, I reviewed the following data within the electronic MEDICAL RECORD NUMBER   32 year old female is here physical exam secondary to right flank pain.  Urinalysis revealed no evidence of UTI CT scan of the abdomen pelvis revealed no evidence of a kidney stone.  Regarding the stranding noted on the CT patient has no pelvic pain and states that she will follow-up with her gynecologist today.  No clear etiology noted for the patient's right flank pain and as such patient will be referred  to primary care provider for further outpatient evaluation.  ____________________________________________  FINAL CLINICAL IMPRESSION(S) / ED DIAGNOSES  Final diagnoses:  Right flank pain     MEDICATIONS GIVEN DURING THIS VISIT:  Medications  cyclobenzaprine (FLEXERIL) tablet 5 mg (has no administration in time range)  morphine 4 MG/ML injection 4 mg (4 mg Intravenous Given 04/29/17 0233)  ondansetron (ZOFRAN) injection 4 mg (4 mg Intravenous Given 04/29/17 0233)  ketorolac (TORADOL) 30 MG/ML injection 30 mg (30 mg Intravenous Given 04/29/17 0330)     ED Discharge Orders        Ordered    cyclobenzaprine (FLEXERIL) 10 MG tablet  3 times daily PRN     04/29/17 0441       Note:  This document was prepared using Dragon voice recognition software and may include unintentional dictation errors.    Gregor Hams, MD 04/29/17 [id number]

## 2017-04-29 NOTE — Telephone Encounter (Signed)
Returned call, could not leave VM as mailbox was full.

## 2017-04-29 NOTE — Telephone Encounter (Signed)
Copied from [place] [phone]. Topic: Quick Communication - See Telephone Encounter >> Apr 29, 2017  9:30 AM Ether Griffins B wrote: CRM for notification. See Telephone encounter for: 04/29/17.  Pt states she is going to wait until Cassandra Duarte calls to make an appt. She is wanting to know if she should see her or her OBGYN. She states the doctor at the ED said she needed to be seen today.

## 2017-04-29 NOTE — Telephone Encounter (Signed)
Routing new information to RL.

## 2017-04-29 NOTE — Telephone Encounter (Signed)
Spoke with pt, still in significant pain, now moving to lower back. No fevers but now having some dark vaginal spotting. Given CT findings in ER, will start doxycycline and she will call GYN right away to set up an appt for as soon as possible for further evaluation. Zofran, tylenol, ibuprofen, and can finish small supply of oxycodone given on Wednesday for severe episodes of pain. Strict ER return precautions reviewed with pt.

## 2017-04-29 NOTE — Telephone Encounter (Signed)
Copied from [place] [id number]. Topic: Quick Communication - See Telephone Encounter >> Apr 29, 2017  9:30 AM Ether Griffins B wrote: CRM for notification. See Telephone encounter for: 04/29/17.  Pt states she is going to wait until Merrie Roof calls to make an appt. She is wanting to know if she should see her or her OBGYN. She states the doctor at the ED said she needed to be seen today.   >> Apr 29, 2017 11:49 AM Selinda Flavin B, NT wrote: Patient called and states that she is just going to go ahead an schedule an appointment for Monday 05/02/17. Also, patient is requesting pain medication to be sent to the pharmacy to last her until her appointment Monday. Please advise.

## 2017-04-30 NOTE — Patient Instructions (Signed)
Follow up as needed

## 2017-05-02 ENCOUNTER — Inpatient Hospital Stay: Payer: Medicaid Other | Admitting: Family Medicine

## 2017-05-03 ENCOUNTER — Ambulatory Visit: Payer: Medicaid Other | Admitting: Obstetrics and Gynecology

## 2017-05-03 ENCOUNTER — Encounter: Payer: Self-pay | Admitting: Family Medicine

## 2017-05-03 ENCOUNTER — Encounter: Payer: Self-pay | Admitting: Obstetrics and Gynecology

## 2017-05-03 VITALS — BP 105/71 | HR 80 | Ht 63.0 in | Wt 156.0 lb

## 2017-05-03 DIAGNOSIS — M5441 Lumbago with sciatica, right side: Secondary | ICD-10-CM | POA: Diagnosis not present

## 2017-05-03 DIAGNOSIS — G8929 Other chronic pain: Secondary | ICD-10-CM

## 2017-05-03 DIAGNOSIS — N946 Dysmenorrhea, unspecified: Secondary | ICD-10-CM | POA: Diagnosis not present

## 2017-05-03 DIAGNOSIS — N920 Excessive and frequent menstruation with regular cycle: Secondary | ICD-10-CM | POA: Diagnosis not present

## 2017-05-03 DIAGNOSIS — R109 Unspecified abdominal pain: Secondary | ICD-10-CM

## 2017-05-03 NOTE — Patient Instructions (Signed)
1.  Pap smear has been completed today 2.  Laparoscopy with peritoneal biopsies to rule out endometriosis is scheduled 3.  Return for preop appointment the week before surgery  Diagnostic Laparoscopy A diagnostic laparoscopy is a procedure to diagnose diseases in the abdomen. During the procedure, a thin, lighted, pencil-sized instrument called a laparoscope is inserted into the abdomen through an incision. The laparoscope allows your health care provider to look at the organs inside your body. Tell a health care provider about:  Any allergies you have.  All medicines you are taking, including vitamins, herbs, eye drops, creams, and over-the-counter medicines.  Any problems you or family members have had with anesthetic medicines.  Any blood disorders you have.  Any surgeries you have had.  Any medical conditions you have. What are the risks? Generally, this is a safe procedure. However, problems can occur, which may include:  Infection.  Bleeding.  Damage to other organs.  Allergic reaction to the anesthetics used during the procedure.  What happens before the procedure?  Do not eat or drink anything after midnight on the night before the procedure or as directed by your health care provider.  Ask your health care provider about: ? Changing or stopping your regular medicines. ? Taking medicines such as aspirin and ibuprofen. These medicines can thin your blood. Do not take these medicines before your procedure if your health care provider instructs you not to.  Plan to have someone take you home after the procedure. What happens during the procedure?  You may be given a medicine to help you relax (sedative).  You will be given a medicine to make you sleep (general anesthetic).  Your abdomen will be inflated with a gas. This will make your organs easier to see.  Small incisions will be made in your abdomen.  A laparoscope and other small instruments will be inserted  into the abdomen through the incisions.  A tissue sample may be removed from an organ in the abdomen for examination.  The instruments will be removed from the abdomen.  The gas will be released.  The incisions will be closed with stitches (sutures). What happens after the procedure? Your blood pressure, heart rate, breathing rate, and blood oxygen level will be monitored often until the medicines you were given have worn off. This information is not intended to replace advice given to you by your health care provider. Make sure you discuss any questions you have with your health care provider. Document Released: 04/12/2000 Document Revised: 05/15/2015 Document Reviewed: 08/17/2013 Elsevier Interactive Patient Education  [employer].  Endometriosis Endometriosis is a condition in which the tissue that lines the uterus (endometrium) grows outside of its normal location. The tissue may grow in many locations close to the uterus, but it commonly grows on the ovaries, fallopian tubes, vagina, or bowel. When the uterus sheds the endometrium every menstrual cycle, there is bleeding wherever the endometrial tissue is located. This can cause pain because blood is irritating to tissues that are not normally exposed to it. What are the causes? The cause of endometriosis is not known. What increases the risk? You may be more likely to develop endometriosis if you:  Have a family history of endometriosis.  Have never given birth.  Started your period at age 19 or younger.  Have high levels of estrogen in your body.  Were exposed to a certain medicine (diethylstilbestrol) before you were born (in utero).  Had low birth weight.  Were born as  a twin, triplet, or other multiple.  Have a BMI of less than 25. BMI is an estimate of body fat and is calculated from height and weight.  What are the signs or symptoms? Often, there are no symptoms of this condition. If you do have symptoms,  they may:  Vary depending on where your endometrial tissue is growing.  Occur during your menstrual period (most common) or midcycle.  Come and go, or you may go months with no symptoms at all.  Stop with menopause.  Symptoms may include:  Pain in the back or abdomen.  Heavier bleeding during periods.  Pain during sex.  Painful bowel movements.  Infertility.  Pelvic pain.  Bleeding more than once a month.  How is this diagnosed? This condition is diagnosed based on your symptoms and a physical exam. You may have tests, such as:  Blood tests and urine tests. These may be done to help rule out other possible causes of your symptoms.  Ultrasound, to look for abnormal tissues.  An X-ray of the lower bowel (barium enema).  An ultrasound that is done through the vagina (transvaginally).  CT scan.  MRI.  Laparoscopy. In this procedure, a lighted, pencil-sized instrument called a laparoscope is inserted into your abdomen through an incision. The laparoscope allows your health care provider to look at the organs inside your body and check for abnormal tissue to confirm the diagnosis. If abnormal tissue is found, your health care provider may remove a small piece of tissue (biopsy) to be examined under a microscope.  How is this treated? Treatment for this condition may include:  Medicines to relieve pain, such as NSAIDs.  Hormone therapy. This involves using artificial (synthetic) hormones to reduce endometrial tissue growth. Your health care provider may recommend using a hormonal form of birth control, or other medicines.  Surgery. This may be done to remove abnormal endometrial tissue. ? In some cases, tissue may be removed using a laparoscope and a laser (laparoscopic laser treatment). ? In severe cases, surgery may be done to remove the fallopian tubes, uterus, and ovaries (hysterectomy).  Follow these instructions at home:  Take over-the-counter and prescription  medicines only as told by your health care provider.  Do not drive or use heavy machinery while taking prescription pain medicine.  Try to avoid activities that cause pain, including sexual activity.  Keep all follow-up visits as told by your health care provider. This is important. Contact a health care provider if:  You have pain in the area between your hip bones (pelvic area) that occurs: ? Before, during, or after your period. ? In between your period and gets worse during your period. ? During or after sex. ? With bowel movements or urination, especially during your period.  You have problems getting pregnant.  You have a fever. Get help right away if:  You have severe pain that does not get better with medicine.  You have severe nausea and vomiting, or you cannot eat without vomiting.  You have pain that affects only the lower, right side of your abdomen.  You have abdominal pain that gets worse.  You have abdominal swelling.  You have blood in your stool. This information is not intended to replace advice given to you by your health care provider. Make sure you discuss any questions you have with your health care provider. Document Released: 01/02/2000 Document Revised: 10/10/2015 Document Reviewed: 06/07/2015 Elsevier Interactive Patient Education  [employer].

## 2017-05-03 NOTE — Progress Notes (Signed)
GYN ENCOUNTER NOTE  Subjective:       Cassandra Duarte is a 32 y.o. [phone] female is here for gynecologic evaluation of the following issues:  1.  ER follow-up-right flank pain; symptoms have resolved over the past 72 hours; patient feels that she may have passed a kidney stone 2.  Desires Pap smear; no physical since her childbirth 2 years ago; she does have a physical set up with primary care next month 3.  Status post postpartum tubal ligation; history of severe dysmenorrhea and menorrhagia.  Dysmenorrhea is described as 10 out of 10 in intensity; she uses at least 8 Advil a day for pain control; her pain is characterized as central cramping and low back ache with radiation into the buttocks and occasional thighs.  She is not experiencing deep thrusting dyspareunia. She has missed work because of heavy bleeding and pain; she is not taking any narcotics.  Menorrhagia is described as 7-8 days of heavy flow with clots. She has not been diagnosed with endometriosis in the past   Gynecologic History No LMP recorded (lmp unknown). Contraception: Tubal ligation, postpartum  Obstetric History OB History  Gravida Para Term Preterm AB Living  8 3 3   5 3   SAB TAB Ectopic Multiple Live Births  5     0 3    # Outcome Date GA Lbr Len/2nd Weight Sex Delivery Anes PTL Lv  8 Term 02/20/15 [redacted]w[redacted]d / 00:48 8 lb 11.7 oz (3.96 kg) M Vag-Spont EPI  LIV  7 SAB 11/2013          6 SAB 2015          5 SAB 2014          4 SAB 2013          3 Term 02/07/08 [redacted]w[redacted]d  7 lb (3.175 kg) F Vag-Spont   LIV     Complications: Preterm contractions, second trimester  2 SAB 2007          1 Term 07/21/04 [redacted]w[redacted]d  6 lb 6 oz (2.892 kg) F Vag-Spont   LIV     Complications: Preterm contractions, second trimester    Obstetric Comments  H/O PTL both pregnanacies starting around 28 weeks requiring bedrest and pelvic rest - did carry to 37 and 38 weeks respectfully    Past Medical History:  Diagnosis Date  . Vaginal Pap  smear, abnormal 2012   REPEAT AND WAS NORMAL    Past Surgical History:  Procedure Laterality Date  . DILATION AND CURETTAGE OF UTERUS  2014   WS  . TUBAL LIGATION Bilateral 02/21/2015   Procedure: BILATERAL TUBAL LIGATION;  Surgeon: Brayton Mars, MD;  Location: [facility] ORS;  Service: Gynecology;  Laterality: Bilateral;    Current Outpatient Medications on File Prior to Visit  Medication Sig Dispense Refill  . doxycycline (VIBRA-TABS) 100 MG tablet Take 1 tablet (100 mg total) by mouth 2 (two) times daily. 20 tablet 0  . ondansetron (ZOFRAN ODT) 4 MG disintegrating tablet Take 1 tablet (4 mg total) by mouth every 8 (eight) hours as needed. 45 tablet 0   No current facility-administered medications on file prior to visit.     Allergies  Allergen Reactions  . Magnesium-Containing Compounds Anaphylaxis  . Tamiflu [Oseltamivir Phosphate] Nausea And Vomiting    intractible  . Tramadol Itching    Social History   Socioeconomic History  . Marital status: Single    Spouse name: Not on file  . Number  of children: Not on file  . Years of education: Not on file  . Highest education level: Not on file  Occupational History  . Not on file  Social Needs  . Financial resource strain: Not on file  . Food insecurity:    Worry: Not on file    Inability: Not on file  . Transportation needs:    Medical: Not on file    Non-medical: Not on file  Tobacco Use  . Smoking status: Former Smoker    Packs/day: 0.50    Years: 4.00    Pack years: 2.00    Types: Cigarettes    Last attempt to quit: 04/02/2014    Years since quitting: 3.0  . Smokeless tobacco: Never Used  Substance and Sexual Activity  . Alcohol use: No    Alcohol/week: 0.0 oz  . Drug use: No  . Sexual activity: Yes    Birth control/protection: None, Surgical  Lifestyle  . Physical activity:    Days per week: Not on file    Minutes per session: Not on file  . Stress: Not on file  Relationships  . Social connections:     Talks on phone: Not on file    Gets together: Not on file    Attends religious service: Not on file    Active member of club or organization: Not on file    Attends meetings of clubs or organizations: Not on file    Relationship status: Not on file  . Intimate partner violence:    Fear of current or ex partner: Not on file    Emotionally abused: Not on file    Physically abused: Not on file    Forced sexual activity: Not on file  Other Topics Concern  . Not on file  Social History Narrative  . Not on file    Family History  Problem Relation Age of Onset  . Breast cancer Paternal Aunt   . Diabetes Paternal Aunt   . Diabetes Maternal Grandfather   . COPD Maternal Grandmother   . Cancer Maternal Grandmother        lung  . Colon cancer Neg Hx   . Ovarian cancer Neg Hx   . Heart disease Neg Hx     The following portions of the patient's history were reviewed and updated as appropriate: allergies, current medications, past family history, past medical history, past social history, past surgical history and problem list.  Review of Systems Review of Systems -flank pain is resolved.  Dysmenorrhea and menorrhagia as described in HPI.  Remainder of comprehensive review of systems is negative  Objective:   BP 105/71   Pulse 80   Ht 5\' 3"  (1.6 m)   Wt 156 lb (70.8 kg)   LMP  (LMP Unknown)   Breastfeeding? Yes   BMI 27.63 kg/m  CONSTITUTIONAL: Well-developed, well-nourished female in no acute distress.  HENT:  Normocephalic, atraumatic.  NECK: Normal range of motion, supple, no masses.  Normal thyroid.  SKIN: Skin is warm and dry. No rash noted. Not diaphoretic. No erythema. No pallor. Cotton: Alert and oriented to person, place, and time. PSYCHIATRIC: Normal mood and affect. Normal behavior. Normal judgment and thought content. CARDIOVASCULAR:Not Examined RESPIRATORY: Not Examined BREASTS: Not Examined ABDOMEN: Soft, non distended; Non tender.  No  Organomegaly. PELVIC:  External Genitalia: Normal external genitalia  BUS: Normal  Vagina: Normal estrogen effect  cervix: No cervical motion tenderness  Uterus: Normal size, shape,consistency, mobile, midplane, mild to moderate tenderness  Adnexa: Normal; nonpalpable nontender  RV: Normal external exam  Bladder: Nontender MUSCULOSKELETAL: Normal range of motion. No tenderness.  No cyanosis, clubbing, or edema.     Assessment:   1. Dysmenorrhea  2. Menorrhagia with regular cycle  3. Right flank pain, resolved  4. Chronic right-sided low back pain with right-sided sciatica  5.  Possible endometriosis     Plan:   1.  Pap smear is done per patient request 2.  Laparoscopy with peritoneal biopsies is recommended. 3.  Patient will return for preop appointment the week before surgery 4.  Maintain annual gynecologic exam with primary care as scheduled 5.  Options of management of endometriosis were reviewed with the patient, including medical and surgical management.  A total of 15 minutes were spent face-to-face with the patient during this encounter and over half of that time dealt with counseling and coordination of care.  Brayton Mars, MD  Note: This dictation was prepared with Dragon dictation along with smaller phrase technology. Any transcriptional errors that result from this process are unintentional.

## 2017-05-05 LAB — IGP, COBASHPV16/18
HPV 16: NEGATIVE
HPV 18: NEGATIVE
HPV other hr types: NEGATIVE
PAP Smear Comment: 0

## 2017-05-10 ENCOUNTER — Encounter: Payer: Self-pay | Admitting: Student in an Organized Health Care Education/Training Program

## 2017-05-10 ENCOUNTER — Telehealth: Payer: Self-pay | Admitting: Family Medicine

## 2017-05-10 ENCOUNTER — Other Ambulatory Visit: Payer: Self-pay

## 2017-05-10 ENCOUNTER — Ambulatory Visit
Payer: Medicaid Other | Attending: Student in an Organized Health Care Education/Training Program | Admitting: Student in an Organized Health Care Education/Training Program

## 2017-05-10 VITALS — BP 118/76 | HR 59 | Temp 98.3°F | Resp 16 | Ht 63.0 in | Wt 153.0 lb

## 2017-05-10 DIAGNOSIS — M7918 Myalgia, other site: Secondary | ICD-10-CM | POA: Insufficient documentation

## 2017-05-10 DIAGNOSIS — M4306 Spondylolysis, lumbar region: Secondary | ICD-10-CM

## 2017-05-10 DIAGNOSIS — M545 Low back pain: Secondary | ICD-10-CM | POA: Diagnosis present

## 2017-05-10 DIAGNOSIS — M5441 Lumbago with sciatica, right side: Principal | ICD-10-CM

## 2017-05-10 DIAGNOSIS — G629 Polyneuropathy, unspecified: Secondary | ICD-10-CM | POA: Insufficient documentation

## 2017-05-10 DIAGNOSIS — G8929 Other chronic pain: Secondary | ICD-10-CM

## 2017-05-10 DIAGNOSIS — M5442 Lumbago with sciatica, left side: Secondary | ICD-10-CM | POA: Diagnosis not present

## 2017-05-10 DIAGNOSIS — G894 Chronic pain syndrome: Secondary | ICD-10-CM | POA: Diagnosis not present

## 2017-05-10 MED ORDER — DICLOFENAC SODIUM 75 MG PO TBEC: 75.0000 mg | DELAYED_RELEASE_TABLET | Freq: Two times a day (BID) | ORAL | 0 refills | Status: DC

## 2017-05-10 NOTE — Progress Notes (Signed)
Safety precautions to be maintained throughout the outpatient stay will include: orient to surroundings, keep bed in low position, maintain call bell within reach at all times, provide assistance with transfer out of bed and ambulation.  

## 2017-05-10 NOTE — Telephone Encounter (Signed)
Copied from [place] [id number]. Topic: Quick Communication - See Telephone Encounter >> May 10, 2017 10:17 AM Aurelio Brash B wrote: CRM for notification. See Telephone encounter for: 05/10/17. PT saw pain mgt Dr today he is referring her to neurology and wants to wait til he gets radiology report back  before treating her,  and recommended she touch base with pcp  for pain medication    oxyCODONE-acetaminophen (PERCOCET/ROXICET) 5-325 MG tablet  [facility], [place] [phone] (Phone) [phone] (Fax)

## 2017-05-10 NOTE — Progress Notes (Signed)
Patient's Name: Cassandra Duarte  MRN: [id number]  Referring Provider: Volney American,*  DOB: 1985/09/03  PCP: Volney American, PA-C  DOS: 05/10/2017  Note by: Gillis Santa, MD  Service setting: Ambulatory outpatient  Specialty: Interventional Pain Management  Location: [facility] (AMB) Pain Management Facility  Visit type: Initial Patient Evaluation  Patient type: New Patient   Primary Reason(s) for Visit: Encounter for initial evaluation of one or more chronic problems (new to examiner) potentially causing chronic pain, and posing a threat to normal musculoskeletal function. (Level of risk: High) CC: Back Pain (low)  HPI  Cassandra Duarte is a 32 y.o. year old, female patient, who comes today to see [place] for the first time for an initial evaluation of her chronic pain. She has Chronic low back pain with right-sided sciatica; Overweight; Lactational amenorrhea; Family history of breast cancer; Menorrhagia with regular cycle; and Dysmenorrhea on their problem list. Today she comes in for evaluation of her Back Pain (low)  Pain Assessment: Location: Lower, Right, Left Back Radiating: sometimes radiates down legs to toes Onset: More than a month ago Duration: Chronic pain Quality: Sharp, Aching Severity: 4 /10 (self-reported pain score)  Note: Reported level is compatible with observation.                         When using our objective Pain Scale, levels between 6 and 10/10 are said to belong in an emergency room, as it progressively worsens from a 6/10, described as severely limiting, requiring emergency care not usually available at an outpatient pain management facility. At a 6/10 level, communication becomes difficult and requires great effort. Assistance to reach the emergency department may be required. Facial flushing and profuse sweating along with potentially dangerous increases in heart rate and blood pressure will be evident. Effect on ADL:   Timing: Constant Modifying factors:  warm showers, ice, chiropractor  Onset and Duration: Gradual and Present longer than 3 months Cause of pain: Unknown Severity: NAS-11 at its worse: 9/10, NAS-11 at its best: 3/10, NAS-11 now: 4/10 and NAS-11 on the average: 6/10 Timing: Morning, Night and After activity or exercise Aggravating Factors: Bending, Prolonged sitting, Prolonged standing and Twisting Alleviating Factors: Cold packs and Medications Associated Problems: Numbness, Swelling and Pain that does not allow patient to sleep Quality of Pain: Aching, Sharp, Stabbing, Tender and Tingling Previous Examinations or Tests: CT scan and MRI scan Previous Treatments: Chiropractic manipulations, Epidural steroid injections, Physical Therapy, Pool exercises, Steroid treatments by mouth, Stretching exercises and Trigger point injections  The patient comes into the clinics today for the first time for a chronic pain management evaluation.    32 year old female who presents with a chief complaint of axial low back pain that radiates into bilateral legs.  Patient describes numbness and tingling in her legs as well.  She denies any bowel bladder dysfunction.  She states that she has been dealing with this for many years.  She states that her pain got worse after labor epidural 2 years ago.  She states that stretching helps with her pain.  She is tried physical therapy and chiropractic therapy in the past.  She states that she takes Percocet at night to help with sleep which she does not find very effective.  She is also being worked up for possible endometriosis.  Today I took the time to provide the patient with information regarding my pain practice. The patient was informed that my practice is divided into two  sections: an interventional pain management section, as well as a completely separate and distinct medication management section. I explained that I have procedure days for my interventional therapies, and evaluation days for follow-ups  and medication management. Because of the amount of documentation required during both, they are kept separated. This means that there is the possibility that she may be scheduled for a procedure on one day, and medication management the next. I have also informed her that because of staffing and facility limitations, I no longer take patients for medication management only. To illustrate the reasons for this, I gave the patient the example of surgeons, and how inappropriate it would be to refer a patient to his/her care, just to write for the post-surgical antibiotics on a surgery done by a different surgeon.   Because interventional pain management is my board-certified specialty, the patient was informed that joining my practice means that they are open to any and all interventional therapies. I made it clear that this does not mean that they will be forced to have any procedures done. What this means is that I believe interventional therapies to be essential part of the diagnosis and proper management of chronic pain conditions. Therefore, patients not interested in these interventional alternatives will be better served under the care of a different practitioner.  The patient was also made aware of my Comprehensive Pain Management Safety Guidelines where by joining my practice, they limit all of their nerve blocks and joint injections to those done by our practice, for as long as we are retained to manage their care.   Historic Controlled Substance Pharmacotherapy Review  PMP and historical list of controlled substances: Percocet 5 mg, quantity 10, last filled 04/27/2017.  Written for acute pain.  Patient not on chronic opioid therapy.  Medications: The patient did not bring the medication(s) to the appointment, as requested in our "New Patient Package" Pharmacodynamics: Desired effects: Analgesia: The patient reports <50% benefit. Reported improvement in function: The patient reports medication  allows her to have a normal productive life, including accomplishing all ADLs. Clinically meaningful improvement in function (CMIF): Sustained CMIF goals met Perceived effectiveness: None Undesirable effects: Side-effects or Adverse reactions: None reported Historical Monitoring: The patient  reports that she does not use drugs. List of all UDS Test(s): No results found for: MDMA, COCAINSCRNUR, [place], [place], CANNABQUANT, [place], [place] List of other Serum/Urine Drug Screening Test(s):  No results found for: AMPHSCRSER, BARBSCRSER, BENZOSCRSER, COCAINSCRSER, COCAINSCRNUR, PCPSCRSER, PCPQUANT, THCSCRSER, THCU, CANNABQUANT, OPIATESCRSER, OXYSCRSER, PROPOXSCRSER, ETH Historical Background Evaluation: [place] PMP: Six (6) year initial data search conducted.             [place] Department of public safety, offender search: Editor, commissioning Information) Non-contributory Risk Assessment Profile: Aberrant behavior: continued use despite claims of ineffective analgesia, extensive time discussing medicaiton and frequent emergency department visits for pain Risk factors for fatal opioid overdose: age 87-16 years old, caucasian and history of multiple prescribers Fatal overdose hazard ratio (HR): Calculation deferred Non-fatal overdose hazard ratio (HR): Calculation deferred Risk of opioid abuse or dependence: 0.7-3.0% with doses ? 36 MME/day and 6.1-26% with doses ? 120 MME/day. Substance use disorder (SUD) risk level: Moderate Opioid risk tool (ORT) (Total Score): 1 Opioid Risk Tool - 05/10/17 0916      Family History of Substance Abuse   Alcohol  Negative    Illegal Drugs  Negative    Rx Drugs  Negative      Personal History of Substance Abuse   Alcohol  Negative    Illegal Drugs  Negative    Rx Drugs  Negative      Age   Age between 10-45 years   Yes      History of Preadolescent Sexual Abuse   History of Preadolescent Sexual Abuse  Negative or Female      Psychological Disease   Psychological Disease   Negative    Depression  Negative      Total Score   Opioid Risk Tool Scoring  1    Opioid Risk Interpretation  Low Risk      ORT Scoring interpretation table:  Score <3 = Low Risk for SUD  Score between 4-7 = Moderate Risk for SUD  Score >8 = High Risk for Opioid Abuse   PHQ-2 Depression Scale:  Total score: 0  PHQ-2 Scoring interpretation table: (Score and probability of major depressive disorder)  Score 0 = No depression  Score 1 = 15.4% Probability  Score 2 = 21.1% Probability  Score 3 = 38.4% Probability  Score 4 = 45.5% Probability  Score 5 = 56.4% Probability  Score 6 = 78.6% Probability   PHQ-9 Depression Scale:  Total score: 0  PHQ-9 Scoring interpretation table:  Score 0-4 = No depression  Score 5-9 = Mild depression  Score 10-14 = Moderate depression  Score 15-19 = Moderately severe depression  Score 20-27 = Severe depression (2.4 times higher risk of SUD and 2.89 times higher risk of overuse)   Pharmacologic Plan: No opioid analgesics.            Initial impression: Poor candidate for opioid analgesics.  Meds   Current Outpatient Medications:  .  ondansetron (ZOFRAN ODT) 4 MG disintegrating tablet, Take 1 tablet (4 mg total) by mouth every 8 (eight) hours as needed., Disp: 45 tablet, Rfl: 0 .  oxyCODONE-acetaminophen (PERCOCET/ROXICET) 5-325 MG tablet, Take 2 tablets by mouth every 8 (eight) hours as needed for severe pain., Disp: , Rfl:  .  diclofenac (VOLTAREN) 75 MG EC tablet, Take 1 tablet (75 mg total) by mouth 2 (two) times daily., Disp: 60 tablet, Rfl: 0 .  doxycycline (VIBRA-TABS) 100 MG tablet, Take 1 tablet (100 mg total) by mouth 2 (two) times daily. (Patient not taking: Reported on 05/10/2017), Disp: 20 tablet, Rfl: 0  Imaging Review  Lumbosacral Imaging: Lumbar MR wo contrast:  Results for orders placed during the hospital encounter of 02/28/17  MR LUMBAR SPINE WO CONTRAST   Narrative CLINICAL DATA:  Acute right-sided low back pain with  right-sided sciatica.  EXAM: MRI LUMBAR SPINE WITHOUT CONTRAST  TECHNIQUE: Multiplanar, multisequence MR imaging of the lumbar spine was performed. No intravenous contrast was administered.  COMPARISON:  CT scan of the abdomen and pelvis dated 02/28/2017 and lumbar radiographs dated 11/29/2009 and chest x-ray dated 07/27/2010  FINDINGS: Segmentation: Congenital sacralization of the L5 vertebra. Only 4 typical lumbar segments. Twelve rib-bearing vertebra.  Alignment:  Physiologic.  Vertebrae: Complete sacralization of L5 with a vestigial disc at L5-S1. Bilateral pars defects at L4.  Conus medullaris and cauda equina: Conus extends to the T12-L1 level. Conus and cauda equina appear normal.  Paraspinal and other soft tissues: Negative.  Disc levels:  T11-12 through L3-4: Normal.  L4-5: Small broad-based soft disc protrusion with no neural impingement. Central annular fissure. Bilateral pars defects without spondylolisthesis.  L5-S1: Vestigial disc. L5 is congenitally completely fused with the sacrum.  IMPRESSION: 1. 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 Focal degenerative changes associated with the pars defects without neural impingement. 2. 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   Electronically Signed   By: Lorriane Shire M.D.   On: 02/28/2017 17:04    Complexity Note: Imaging results reviewed. Results shared with Cassandra Duarte, using Layman's terms.                         ROS  Cardiovascular: No reported cardiovascular signs or symptoms such as High blood pressure, coronary artery disease, abnormal heart rate or rhythm, heart attack, blood thinner therapy or heart weakness and/or failure Pulmonary or Respiratory: No reported pulmonary signs or symptoms such as wheezing and difficulty taking a deep full breath (Asthma), difficulty blowing air out  (Emphysema), coughing up mucus (Bronchitis), persistent dry cough, or temporary stoppage of breathing during sleep Neurological: No reported neurological signs or symptoms such as seizures, abnormal skin sensations, urinary and/or fecal incontinence, being born with an abnormal open spine and/or a tethered spinal cord Review of Past Neurological Studies: No results found for this or any previous visit. Psychological-Psychiatric: No reported psychological or psychiatric signs or symptoms such as difficulty sleeping, anxiety, depression, delusions or hallucinations (schizophrenial), mood swings (bipolar disorders) or suicidal ideations or attempts Gastrointestinal: No reported gastrointestinal signs or symptoms such as vomiting or evacuating blood, reflux, heartburn, alternating episodes of diarrhea and constipation, inflamed or scarred liver, or pancreas or irrregular and/or infrequent bowel movements Genitourinary: No reported renal or genitourinary signs or symptoms such as difficulty voiding or producing urine, peeing blood, non-functioning kidney, kidney stones, difficulty emptying the bladder, difficulty controlling the flow of urine, or chronic kidney disease Hematological: No reported hematological signs or symptoms such as prolonged bleeding, low or poor functioning platelets, bruising or bleeding easily, hereditary bleeding problems, low energy levels due to low hemoglobin or being anemic Endocrine: No reported endocrine signs or symptoms such as high or low blood sugar, rapid heart rate due to high thyroid levels, obesity or weight gain due to slow thyroid or thyroid disease Rheumatologic: No reported rheumatological signs and symptoms such as fatigue, joint pain, tenderness, swelling, redness, heat, stiffness, decreased range of motion, with or without associated rash Musculoskeletal: Negative for myasthenia gravis, muscular dystrophy, multiple sclerosis or malignant hyperthermia Work History:  Working full time  Allergies  Cassandra Duarte is allergic to magnesium-containing compounds; tamiflu [oseltamivir phosphate]; and tramadol.  Laboratory Chemistry  Inflammation Markers (CRP: Acute Phase) (ESR: Chronic Phase) No results found for: CRP, ESRSEDRATE, LATICACIDVEN                       Rheumatology Markers No results found for: RF, ANA, Therisa [place], [facility]                      Renal Function Markers Lab Results  Component Value Date   BUN 17 04/28/2017   CREATININE 0.86 04/28/2017   GFRAA >60 04/28/2017   GFRNONAA >60 04/28/2017                              Hepatic Function Markers Lab Results  Component Value Date   AST 33 04/28/2017   ALT 43 04/28/2017   ALBUMIN 4.3 04/28/2017   ALKPHOS 68 04/28/2017   LIPASE 29 04/28/2017  Electrolytes Lab Results  Component Value Date   NA 138 04/28/2017   K 3.7 04/28/2017   CL 106 04/28/2017   CALCIUM 8.8 (L) 04/28/2017                        Neuropathy Markers Lab Results  Component Value Date   HGBA1C 5.5 11/20/2015   HIV Non Reactive 06/25/2014                        Bone Pathology Markers Lab Results  Component Value Date   VD25OH 14.3 (L) 03/03/2017                         Coagulation Parameters Lab Results  Component Value Date   PLT 181 04/28/2017                        Cardiovascular Markers Lab Results  Component Value Date   HGB 13.4 04/28/2017   HCT 38.1 04/28/2017                         CA Markers No results found for: CEA, CA125, LABCA2                      Note: Lab results reviewed.  PFSH  Drug: Cassandra Duarte  reports that she does not use drugs. Alcohol:  reports that she does not drink alcohol. Tobacco:  reports that she quit smoking about 3 years ago. Her smoking use included cigarettes. She has a 2.00 pack-year smoking history. She has never used smokeless tobacco. Medical:  has a past medical history of Vaginal Pap smear, abnormal  (2012). Family: family history includes Breast cancer in her paternal aunt; COPD in her maternal grandmother; Cancer in her maternal grandmother; Diabetes in her maternal grandfather and paternal aunt.  Past Surgical History:  Procedure Laterality Date  . DILATION AND CURETTAGE OF UTERUS  2014   WS  . TUBAL LIGATION Bilateral 02/21/2015   Procedure: BILATERAL TUBAL LIGATION;  Surgeon: Brayton Mars, MD;  Location: [facility] ORS;  Service: Gynecology;  Laterality: Bilateral;   Active Ambulatory Problems    Diagnosis Date Noted  . Chronic low back pain with right-sided sciatica 10/09/2014  . Overweight 10/01/2015  . Lactational amenorrhea 10/01/2015  . Family history of breast cancer 10/01/2015  . Menorrhagia with regular cycle 05/03/2017  . Dysmenorrhea 05/03/2017   Resolved Ambulatory Problems    Diagnosis Date Noted  . Abdominal pain 10/24/2014  . Anemia 12/04/2014  . Abnormal glucose tolerance affecting pregnancy, antepartum 12/04/2014  . Gestational diabetes mellitus (GDM) affecting pregnancy, antepartum 12/07/2014  . Indication for care in labor or delivery 12/30/2014  . Premature uterine contractions causing threatened premature labor in third trimester 02/06/2015  . Labor and delivery indication for care or intervention 02/19/2015  . Gestational diabetes mellitus (GDM) 02/20/2015  . Vaginal delivery 02/21/2015   Past Medical History:  Diagnosis Date  . Vaginal Pap smear, abnormal 2012   Constitutional Exam  General appearance: Well nourished, well developed, and well hydrated. In no apparent acute distress Vitals:   05/10/17 0910  BP: 118/76  Pulse: (!) 59  Resp: 16  Temp: 98.3 F (36.8 C)  SpO2: 98%  Weight: 153 lb (69.4 kg)  Height: 5' 3"  (1.6 m)   BMI Assessment: Estimated body mass index is 27.1  kg/m as calculated from the following:   Height as of this encounter: 5' 3"  (1.6 m).   Weight as of this encounter: 153 lb (69.4 kg).  BMI interpretation  table: BMI level Category Range association with higher incidence of chronic pain  <18 kg/m2 Underweight   18.5-24.9 kg/m2 Ideal body weight   25-29.9 kg/m2 Overweight Increased incidence by 20%  30-34.9 kg/m2 Obese (Class I) Increased incidence by 68%  35-39.9 kg/m2 Severe obesity (Class II) Increased incidence by 136%  >40 kg/m2 Extreme obesity (Class III) Increased incidence by 254%   BMI Readings from Last 4 Encounters:  05/10/17 27.10 kg/m  05/03/17 27.63 kg/m  04/28/17 27.46 kg/m  04/27/17 27.79 kg/m   Wt Readings from Last 4 Encounters:  05/10/17 153 lb (69.4 kg)  05/03/17 156 lb (70.8 kg)  04/28/17 155 lb (70.3 kg)  04/27/17 156 lb 14.4 oz (71.2 kg)  Psych/Mental status: Alert, oriented x 3 (person, place, & time)       Eyes: PERLA Respiratory: No evidence of acute respiratory distress  Cervical Spine Area Exam  Skin & Axial Inspection: No masses, redness, edema, swelling, or associated skin lesions Alignment: Symmetrical Functional ROM: Unrestricted ROM      Stability: No instability detected Muscle Tone/Strength: Functionally intact. No obvious neuro-muscular anomalies detected. Sensory (Neurological): Unimpaired Palpation: No palpable anomalies              Upper Extremity (UE) Exam    Side: Right upper extremity  Side: Left upper extremity  Skin & Extremity Inspection: Skin color, temperature, and hair growth are WNL. No peripheral edema or cyanosis. No masses, redness, swelling, asymmetry, or associated skin lesions. No contractures.  Skin & Extremity Inspection: Skin color, temperature, and hair growth are WNL. No peripheral edema or cyanosis. No masses, redness, swelling, asymmetry, or associated skin lesions. No contractures.  Functional ROM: Unrestricted ROM          Functional ROM: Unrestricted ROM          Muscle Tone/Strength: Functionally intact. No obvious neuro-muscular anomalies detected.  Muscle Tone/Strength: Functionally intact. No obvious  neuro-muscular anomalies detected.  Sensory (Neurological): Unimpaired          Sensory (Neurological): Unimpaired          Palpation: No palpable anomalies              Palpation: No palpable anomalies              Specialized Test(s): Deferred         Specialized Test(s): Deferred          Thoracic Spine Area Exam  Skin & Axial Inspection: No masses, redness, or swelling Alignment: Symmetrical Functional ROM: Unrestricted ROM Stability: No instability detected Muscle Tone/Strength: Functionally intact. No obvious neuro-muscular anomalies detected. Sensory (Neurological): Unimpaired Muscle strength & Tone: No palpable anomalies  Lumbar Spine Area Exam  Skin & Axial Inspection: No masses, redness, or swelling Alignment: Symmetrical Functional ROM: Unrestricted ROM       Stability: No instability detected Muscle Tone/Strength: Functionally intact. No obvious neuro-muscular anomalies detected. Sensory (Neurological): Musculoskeletal pain pattern Palpation: No palpable anomalies       Provocative Tests: Lumbar Hyperextension and rotation test: Negative bilaterally for facet joint pain. Lumbar Lateral bending test: Negative       Patrick's Maneuver: evaluation deferred today                    Gait & Posture Assessment  Ambulation: Unassisted Gait:  Relatively normal for age and body habitus Posture: WNL   Lower Extremity Exam    Side: Right lower extremity  Side: Left lower extremity  Skin & Extremity Inspection: Skin color, temperature, and hair growth are WNL. No peripheral edema or cyanosis. No masses, redness, swelling, asymmetry, or associated skin lesions. No contractures.  Skin & Extremity Inspection: Skin color, temperature, and hair growth are WNL. No peripheral edema or cyanosis. No masses, redness, swelling, asymmetry, or associated skin lesions. No contractures.  Functional ROM: Unrestricted ROM          Functional ROM: Unrestricted ROM          Muscle Tone/Strength:  Functionally intact. No obvious neuro-muscular anomalies detected.  Muscle Tone/Strength: Functionally intact. No obvious neuro-muscular anomalies detected.  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 Diagnoses of Chronic bilateral low back pain with bilateral sciatica, Myofascial pain, Neuropathy, and Chronic pain syndrome were also pertinent to this visit.  Visit Diagnosis (New problems to examiner): 1. Pars defect of lumbar spine   2. Chronic bilateral low back pain with bilateral sciatica   3. Myofascial pain   4. Neuropathy   5. Chronic pain syndrome    *Chronic opioid therapy will NOT be part of the treatment plan*  32 year old female who presents with a chief complaint of axial low back pain that radiates occasionally to the back of her thighs past her knees to her toes.  Patient's lumbar MRI is largely unremarkable.  She does have a bilateral pars defect without any neural impingement or spondylolisthesis.  Furthermore she does not have any canal stenosis.  No significant spondylosis.  She does have mild degenerative disease.  Patient also has complete sacralization of L5 vertebra.  Patient has been seen and evaluated by neurosurgery and surgical intervention was not recommended.  Given that there is no significant neural impingement, I do not suspect radiculopathy to be causing in her symptoms.  Furthermore pars defect as well as sacralization of L5 vertebra can be anatomical variants that are largely asymptomatic.  Furthermore I feel assured with neurosurgical recommendation that surgery is not recommended.  For her axial low back pain, I recommended physical therapy which the patient states that she is Artie completed.  She states that she also sees a chiropractor for her  axial low back.  In regards to her endometriosis which is currently being worked up, she is scheduled for a diagnostic laparoscopy.  Either way I reviewed treatment of endometriosis which includes NSAID therapy, hormonal therapy, GN Rh agonist, aromatase inhibitors, possible surgical resection.  Patient can trial NSAIDs to see if they help with her axial low back pain that could be related to possible endometriosis.  Patient is complaining of tingling in her legs.  Given that her lumbar MRI is largely unremarkable for nerve root compression, it is possible that the patient has peripheral neuropathy and recommended follow-up with neurologist for possible EMG/nerve conduction velocity studies to evaluate for peripheral neuropathy.  I had an extensive discussion with the patient that chronic opioid therapy will not be a part of her treatment plan.  First off I explained that while she is having a axial low back pain, this could be myofascial in etiology and that her lumbar MRI did not show any significant pathology that would be an amenable to interventional procedures such  as epidural steroid injection, facet block, radio frequency ablation, spinal cord stim.  Furthermore the patient states that the Percocet she takes at night is not effective reinforcing my recommendation that chronic opioid therapy for her chronic pain syndrome will not be the best option for her pain management in the long run.   Plan: -NSAID trial with diclofenac 75 mg twice daily for 30 days. -Referral to neurology for evaluation of peripheral neuropathy; possible EMG/NCV studies -Recommend physical therapy for axial low back paraspinal muscle strengthening and range of motion -Recommended TENS unit the patient can obtain over-the-counter -Patient WILL NOT candidate for opioid therapy at this clinic given reasons listed above -Follow-up as needed  Ordered Lab-work, Procedure(s), Referral(s), & Consult(s): Orders Placed This  Encounter  Procedures  . Ambulatory referral to Neurology   Pharmacotherapy (current): Medications ordered:  Meds ordered this encounter  Medications  . diclofenac (VOLTAREN) 75 MG EC tablet    Sig: Take 1 tablet (75 mg total) by mouth 2 (two) times daily.    Dispense:  60 tablet    Refill:  0   Medications administered during this visit: Cassandra Duarte had no medications administered during this visit.   Pharmacological management options:  Opioid Analgesics: Not an option, non-opioid analgesic therapy ONLY  Membrane stabilizer: To be determined at a later time can consider gabapentin, Lyrica, TCA  Muscle relaxant: To be determined at a later time can consider tizanidine, baclofen, Flexeril  NSAID: To be determined at a later time trial of diclofenac.  Can consider Mobic, naproxen, Celebrex  Other analgesic(s): To be determined at a later time recommended TENS    Provider-requested follow-up: Return in about 8 weeks (around 07/05/2017) for MM with Crystal.  Future Appointments  Date Time Provider Diaz  06/29/2017  9:45 AM Vevelyn Francois, NP [facility] None    Primary Care Physician: Volney American, PA-C Location: [facility] Note by: Gillis Santa, M.D, Date: 05/10/2017; Time: 10:40 AM  There are no Patient Instructions on file for this visit.

## 2017-05-11 NOTE — Telephone Encounter (Signed)
Spoke with patient. She stated that she did not like that doctor and would like to go to another pain management doctor. Patient stated the doctor she saw blamed all of symptoms on endometriosis, which patient says she hasn't been diagnosed yet with endometriosis. I gave patient Rachel's information.  Patient also stated that she never got her Neurosurgery appointment with [facility]. I looked in [facility] and it was marked as "no show". Patient stated she never got a phone call or appointment from them. I will resend a new referral through [facility] for Neurosurgery.   Apolonio Schneiders, okay for new appointment with pain management? Any ideas where to send patient?

## 2017-05-11 NOTE — Telephone Encounter (Signed)
Reviewed Pain Management's note stating she is not a good candidate for opioid management and that they have started her on a diclofenac regimen. Will defer to their plan at this time

## 2017-05-12 NOTE — Telephone Encounter (Signed)
Happy to place a new referral for her for a second opinion with pain management. Referral generated

## 2017-05-16 ENCOUNTER — Telehealth: Payer: Self-pay | Admitting: Obstetrics and Gynecology

## 2017-05-16 MED ORDER — OXYCODONE-ACETAMINOPHEN 5-325 MG PO TABS: 1.0000 | ORAL_TABLET | Freq: Four times a day (QID) | ORAL | 0 refills | Status: DC | PRN

## 2017-05-16 NOTE — Telephone Encounter (Signed)
Pt aware. Percocet rx left up front for p/u.

## 2017-05-16 NOTE — Telephone Encounter (Signed)
Patient called stating she is having severe menstrual cramps and is in a lot of pain. She would like a call back.

## 2017-05-16 NOTE — Telephone Encounter (Signed)
Pt started her cycle yesterday. She is bleeding with clots and black old type blood. She is changing q 2h. She has tried ibup 800 and 4 hours later she tried tylenol with mild relief. She is using a heating pad and warm tub bath. Advised pt to continue with ibup 800 q8 and 2 es tylenol q6.  Pt would like to know what else she can do or take? Will send to mad.

## 2017-05-26 ENCOUNTER — Encounter: Payer: Self-pay | Admitting: Family Medicine

## 2017-05-26 ENCOUNTER — Ambulatory Visit (INDEPENDENT_AMBULATORY_CARE_PROVIDER_SITE_OTHER): Payer: Medicaid Other | Admitting: Family Medicine

## 2017-05-26 VITALS — BP 113/75 | HR 77 | Temp 98.5°F | Ht 63.0 in | Wt 155.5 lb

## 2017-05-26 DIAGNOSIS — F988 Other specified behavioral and emotional disorders with onset usually occurring in childhood and adolescence: Secondary | ICD-10-CM | POA: Insufficient documentation

## 2017-05-26 DIAGNOSIS — R41840 Attention and concentration deficit: Secondary | ICD-10-CM

## 2017-05-26 DIAGNOSIS — Z13220 Encounter for screening for lipoid disorders: Secondary | ICD-10-CM | POA: Diagnosis not present

## 2017-05-26 NOTE — Progress Notes (Addendum)
BP 113/75   Pulse 77   Temp 98.5 F (36.9 C) (Oral)   Ht 5\' 3"  (1.6 m)   Wt 155 lb 8 oz (70.5 kg)   LMP  (LMP Unknown)   SpO2 99%   BMI 27.55 kg/m    Subjective:    Patient ID: Cassandra Duarte, female    DOB: October 27, 1985, 32 y.o.   MRN: [id number]  HPI: Cassandra Duarte is a 32 y.o. female  Chief Complaint  Patient presents with  . Concentration    pt states she has been having trouble concentrating and focusing at school and at home. States her mom recently told her that she did have ADHD when she was younger   Cassandra Duarte is a 32yo female who presents today for evaluation of issues concentrating. She states that this has been going on for "ever", but has only started having a major problem with going back to school this last semester. She states that her mom told her that she had IEPs when she was little. She states that they thought that she had ADD, but her Dad wouldn't let her get any medicine. She has never been on medicine. She notes that she gets bored very easily. She has a lot of issues with reading comprehension. She notes that she gets very easily distracted. She also has a very difficult time with both school and at home. She states that she has a lot of issues getting overwhelmed and has a lot of issues organizing. She is having issues with her SO who says she doesn't listen. She interrupts people a lot and doesn't listen as much. She notes that she has a lot of trouble sitting still. She has issues with feeling very fatigued. Notes that she is very foggy at times as well.    Depression screen [facility] 2/9 05/26/2017 05/10/2017 04/08/2015 12/09/2014  Decreased Interest 0 0 0 0  Down, Depressed, Hopeless 0 0 0 0  PHQ - 2 Score 0 0 0 0  Altered sleeping 1 - - -  Tired, decreased energy 2 - - -  Change in appetite 0 - - -  Feeling bad or failure about yourself  0 - - -  Trouble concentrating 3 - - -  Moving slowly or fidgety/restless 0 - - -  Suicidal thoughts 0 - - -    PHQ-9 Score 6 - - -   GAD 7 : Generalized Anxiety Score 05/26/2017  Nervous, Anxious, on Edge 1  Control/stop worrying 2  Worry too much - different things 3  Trouble relaxing 1  Restless 1  Easily annoyed or irritable 1  Afraid - awful might happen 0  Total GAD 7 Score 9  Anxiety Difficulty Somewhat difficult      Relevant past medical, surgical, family and social history reviewed and updated as indicated. Interim medical history since our last visit reviewed. Allergies and medications reviewed and updated.  Review of Systems  Constitutional: Negative.   Respiratory: Negative.   Cardiovascular: Negative.   Musculoskeletal: Negative.   Neurological: Negative.   Psychiatric/Behavioral: Positive for agitation and decreased concentration. Negative for behavioral problems, confusion, dysphoric mood, hallucinations, self-injury, sleep disturbance and suicidal ideas. The patient is hyperactive. The patient is not nervous/anxious.     Per HPI unless specifically indicated above     Objective:    BP 113/75   Pulse 77   Temp 98.5 F (36.9 C) (Oral)   Ht 5\' 3"  (1.6 m)   Wt 155 lb  8 oz (70.5 kg)   LMP  (LMP Unknown)   SpO2 99%   BMI 27.55 kg/m   Wt Readings from Last 3 Encounters:  05/26/17 155 lb 8 oz (70.5 kg)  05/10/17 153 lb (69.4 kg)  05/03/17 156 lb (70.8 kg)    Physical Exam  Constitutional: She is oriented to person, place, and time. She appears well-developed and well-nourished. No distress.  HENT:  Head: Normocephalic and atraumatic.  Right Ear: Hearing normal.  Left Ear: Hearing normal.  Nose: Nose normal.  Eyes: Conjunctivae and lids are normal. Right eye exhibits no discharge. Left eye exhibits no discharge. No scleral icterus.  Cardiovascular: Normal rate, regular rhythm, normal heart sounds and intact distal pulses. Exam reveals no gallop and no friction rub.  No murmur heard. Pulmonary/Chest: Effort normal and breath sounds normal. No stridor. No  respiratory distress. She has no wheezes. She has no rales. She exhibits no tenderness.  Musculoskeletal: Normal range of motion.  Neurological: She is alert and oriented to person, place, and time.  Skin: Skin is warm, dry and intact. Capillary refill takes less than 2 seconds. No rash noted. She is not diaphoretic. No erythema. No pallor.  Psychiatric: She has a normal mood and affect. Her behavior is normal. Judgment and thought content normal. Her speech is rapid and/or pressured. Cognition and memory are normal.  Nursing note and vitals reviewed.   Results for orders placed or performed in visit on 05/03/17  IGP, [id number]  Result Value Ref Range   DIAGNOSIS: Comment    Specimen adequacy: Comment    Clinician Provided ICD10 Comment    Performed by: Comment    PAP Smear Comment .    Note: Comment    Test Methodology Comment    HPV other hr types Negative Negative   HPV 16 Negative Negative   HPV 18 Negative Negative      Assessment & Plan:   Problem List Items Addressed This Visit      Other   Concentration deficit - Primary    Never formally diagnosed with ADD as a child, but has been going on throughout school and getting worse with increasing responsibilities. Strong suspicion of ADD. Will check labs to look for other cause. Will get her into neuropsych testing and treat as needed pending results. Continue to monitor. Call with any concerns.       Relevant Orders   CBC with Differential/Platelet   Comprehensive metabolic panel   TSH   Ambulatory referral to Neuropsychology    Other Visit Diagnoses    Screening for cholesterol level       Labs drawn today.   Relevant Orders   Lipid Panel w/o Chol/HDL Ratio       Follow up plan: Return Pending neuropsych testing.

## 2017-05-26 NOTE — Assessment & Plan Note (Signed)
Never formally diagnosed with ADD as a child, but has been going on throughout school and getting worse with increasing responsibilities. Strong suspicion of ADD. Will check labs to look for other cause. Will get her into neuropsych testing and treat as needed pending results. Continue to monitor. Call with any concerns.

## 2017-05-27 LAB — CBC WITH DIFFERENTIAL/PLATELET
Basophils Absolute: 0 10*3/uL (ref 0.0–0.2)
Basos: 0 %
EOS (ABSOLUTE): 0.3 10*3/uL (ref 0.0–0.4)
EOS: 4 %
HEMATOCRIT: 39.9 % (ref 34.0–46.6)
Hemoglobin: 13.4 g/dL (ref 11.1–15.9)
Immature Grans (Abs): 0 10*3/uL (ref 0.0–0.1)
Immature Granulocytes: 0 %
LYMPHS ABS: 2.5 10*3/uL (ref 0.7–3.1)
Lymphs: 35 %
MCH: 29.4 pg (ref 26.6–33.0)
MCHC: 33.6 g/dL (ref 31.5–35.7)
MCV: 88 fL (ref 79–97)
MONOS ABS: 0.4 10*3/uL (ref 0.1–0.9)
Monocytes: 6 %
NEUTROS ABS: 4 10*3/uL (ref 1.4–7.0)
Neutrophils: 55 %
Platelets: 202 10*3/uL (ref 150–379)
RBC: 4.56 x10E6/uL (ref 3.77–5.28)
RDW: 13.8 % (ref 12.3–15.4)
WBC: 7.2 10*3/uL (ref 3.4–10.8)

## 2017-05-27 LAB — COMPREHENSIVE METABOLIC PANEL WITH GFR
ALT: 31 IU/L (ref 0–32)
AST: 25 IU/L (ref 0–40)
Albumin/Globulin Ratio: 2 (ref 1.2–2.2)
Albumin: 4.4 g/dL (ref 3.5–5.5)
Alkaline Phosphatase: 70 IU/L (ref 39–117)
BUN/Creatinine Ratio: 21 (ref 9–23)
BUN: 15 mg/dL (ref 6–20)
Bilirubin Total: 0.3 mg/dL (ref 0.0–1.2)
CO2: 21 mmol/L (ref 20–29)
Calcium: 8.7 mg/dL (ref 8.7–10.2)
Chloride: 103 mmol/L (ref 96–106)
Creatinine, Ser: 0.71 mg/dL (ref 0.57–1.00)
GFR calc Af Amer: 131 mL/min/1.73
GFR calc non Af Amer: 114 mL/min/1.73
Globulin, Total: 2.2 g/dL (ref 1.5–4.5)
Glucose: 81 mg/dL (ref 65–99)
Potassium: 3.8 mmol/L (ref 3.5–5.2)
Sodium: 138 mmol/L (ref 134–144)
Total Protein: 6.6 g/dL (ref 6.0–8.5)

## 2017-05-27 LAB — LIPID PANEL W/O CHOL/HDL RATIO
Cholesterol, Total: 167 mg/dL (ref 100–199)
HDL: 26 mg/dL — ABNORMAL LOW (ref >39)
LDL Calculated: 110 mg/dL — ABNORMAL HIGH (ref 0–99)
TRIGLYCERIDES: 154 mg/dL — AB (ref 0–149)
VLDL Cholesterol Cal: 31 mg/dL (ref 5–40)

## 2017-05-27 LAB — TSH: TSH: 1.47 u[IU]/mL (ref 0.450–4.500)

## 2017-06-01 ENCOUNTER — Telehealth: Payer: Self-pay

## 2017-06-01 ENCOUNTER — Encounter

## 2017-06-01 NOTE — Telephone Encounter (Signed)
Copied from [place] [id number]. Topic: Referral - Question >> May 26, 2017  1:05 PM Aurelio Brash B wrote: Reason for CRM: Pt wants to know if she can get a referral  to Neuropsych testing for ADD   to another location  because she can not be seen  until late Aug early Sept  at [facility] >> May 30, 2017 11:49 AM Sandria Manly, CMA wrote: Entering information into referral.  >> Jun 01, 2017 10:15 AM Synthia Innocent wrote: Patient checking status

## 2017-06-01 NOTE — Telephone Encounter (Signed)
Spoke with patient that Neuropsych testing is hard to find that accepts medicaid. That is why we initially sent her to [facility]. I sent her yesterday to [facility] to a doctor with [facility] that accepts medicaid. Patient stated they just called and set up an appointment!  Patient then complained that she had a kidney stone. She went to urgent care and they didn't give her anything. They only did a urine that showed blood. Patient has hx of kidney stones. Explained they gave her flomax and it helped pass the stone. Spoke with Dr. [name]. Explained to patient that Malachy Mood and Dr. [name] were booked today but Dr. [name] had an opening tomorrow at 11:15. But if patient was in pain she needed to go to ED or UC. Patient stated she'd go to urgent care. Explained to patient about [place], and they have an x-ray machine if needed as well. Patient stated was going to [facility] urgent care. Hold off on appointment. Patient felt nauseated and the pain was increasing.  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

## 2017-06-06 ENCOUNTER — Encounter: Payer: Medicaid Other | Attending: Psychology | Admitting: Psychology

## 2017-06-06 DIAGNOSIS — Z87442 Personal history of urinary calculi: Secondary | ICD-10-CM | POA: Insufficient documentation

## 2017-06-06 DIAGNOSIS — G894 Chronic pain syndrome: Secondary | ICD-10-CM | POA: Insufficient documentation

## 2017-06-06 DIAGNOSIS — F988 Other specified behavioral and emotional disorders with onset usually occurring in childhood and adolescence: Secondary | ICD-10-CM | POA: Diagnosis present

## 2017-06-06 DIAGNOSIS — R4189 Other symptoms and signs involving cognitive functions and awareness: Secondary | ICD-10-CM | POA: Diagnosis not present

## 2017-06-06 DIAGNOSIS — F09 Unspecified mental disorder due to known physiological condition: Secondary | ICD-10-CM | POA: Insufficient documentation

## 2017-06-14 ENCOUNTER — Ambulatory Visit (INDEPENDENT_AMBULATORY_CARE_PROVIDER_SITE_OTHER): Payer: Medicaid Other | Admitting: Obstetrics and Gynecology

## 2017-06-14 ENCOUNTER — Encounter: Payer: Self-pay | Admitting: Obstetrics and Gynecology

## 2017-06-14 VITALS — BP 102/66 | HR 106 | Ht 63.0 in | Wt 154.3 lb

## 2017-06-14 DIAGNOSIS — G8929 Other chronic pain: Secondary | ICD-10-CM

## 2017-06-14 DIAGNOSIS — Z01818 Encounter for other preprocedural examination: Secondary | ICD-10-CM

## 2017-06-14 DIAGNOSIS — M5441 Lumbago with sciatica, right side: Secondary | ICD-10-CM

## 2017-06-14 DIAGNOSIS — N920 Excessive and frequent menstruation with regular cycle: Secondary | ICD-10-CM

## 2017-06-14 DIAGNOSIS — N946 Dysmenorrhea, unspecified: Secondary | ICD-10-CM

## 2017-06-14 NOTE — H&P (Signed)
PREOPERATIVE HISTORY AND PHYSICAL  Date of surgery: 06/20/2017 Diagnosis: 1.  Dysmenorrhea 2.  Right lower quadrant/right flank pain 3.  Menorrhagia   Patient is a 32 y.o. [id number] scheduled for surgery on 06/20/2017. Status post postpartum tubal ligation.  History of severe dysmenorrhea and menorrhagia Dysmenorrhea is described as 10 out of 10 in intensity; 8 Advil a day is used for analgesia; pain is characterized as central cramping and low backache with radiation into the buttocks and occasional thighs.  Patient is not experiencing deep thrusting dyspareunia.  Patient has missed work because of heavy bleeding and pain.  She has not been taking any narcotics for pain control. Menorrhagia is described as 7 to 8 days of heavy flow with clots. No prior history of endometriosis Patient was seen in the emergency room in April 2019 for right flank pain, since resolved   Pertinent Gynecological History: Contraception-tubal ligation, postpartum  OB History    Gravida  8   Para  3   Term  3   Preterm      AB  5   Living  3     SAB  5   TAB      Ectopic      Multiple  0   Live Births  3        Obstetric Comments  H/O PTL both pregnanacies starting around 28 weeks requiring bedrest and pelvic rest - did carry to 37 and 38 weeks respectfully         No LMP recorded.    Past Medical History:  Diagnosis Date  . Vaginal Pap smear, abnormal 2012   REPEAT AND WAS NORMAL    Past Surgical History:  Procedure Laterality Date  . DILATION AND CURETTAGE OF UTERUS  2014   WS  . TUBAL LIGATION Bilateral 02/21/2015   Procedure: BILATERAL TUBAL LIGATION;  Surgeon: Brayton Mars, MD;  Location: [facility] ORS;  Service: Gynecology;  Laterality: Bilateral;    OB History  Gravida Para Term Preterm AB Living  8 3 3   5 3   SAB TAB Ectopic Multiple Live Births  5     0 3    # Outcome Date GA Lbr Len/2nd Weight Sex Delivery Anes PTL Lv  8 Term 02/20/15 [redacted]w[redacted]d / 00:48 8 lb  11.7 oz (3.96 kg) M Vag-Spont EPI  LIV  7 SAB 11/2013          6 SAB 2015          5 SAB 2014          4 SAB 2013          3 Term 02/07/08 [redacted]w[redacted]d  7 lb (3.175 kg) F Vag-Spont   LIV     Complications: Preterm contractions, second trimester  2 SAB 2007          1 Term 07/21/04 [redacted]w[redacted]d  6 lb 6 oz (2.892 kg) F Vag-Spont   LIV     Complications: Preterm contractions, second trimester    Obstetric Comments  H/O PTL both pregnanacies starting around 28 weeks requiring bedrest and pelvic rest - did carry to 37 and 38 weeks respectfully    Social History   Socioeconomic History  . Marital status: Single    Spouse name: Not on file  . Number of children: Not on file  . Years of education: Not on file  . Highest education level: Not on file  Occupational History  . Not on file  Social Needs  .  Financial resource strain: Not on file  . Food insecurity:    Worry: Not on file    Inability: Not on file  . Transportation needs:    Medical: Not on file    Non-medical: Not on file  Tobacco Use  . Smoking status: Former Smoker    Packs/day: 0.50    Years: 4.00    Pack years: 2.00    Types: Cigarettes    Last attempt to quit: 04/02/2014    Years since quitting: 3.2  . Smokeless tobacco: Never Used  Substance and Sexual Activity  . Alcohol use: No    Alcohol/week: 0.0 oz  . Drug use: No  . Sexual activity: Yes    Birth control/protection: None, Surgical  Lifestyle  . Physical activity:    Days per week: Not on file    Minutes per session: Not on file  . Stress: Not on file  Relationships  . Social connections:    Talks on phone: Not on file    Gets together: Not on file    Attends religious service: Not on file    Active member of club or organization: Not on file    Attends meetings of clubs or organizations: Not on file    Relationship status: Not on file  Other Topics Concern  . Not on file  Social History Narrative  . Not on file    Family History  Problem Relation Age  of Onset  . Breast cancer Paternal Aunt   . Diabetes Paternal Aunt   . Diabetes Maternal Grandfather   . COPD Maternal Grandmother   . Cancer Maternal Grandmother        lung  . Colon cancer Neg Hx   . Ovarian cancer Neg Hx   . Heart disease Neg Hx      (Not in a hospital admission)  Allergies  Allergen Reactions  . Magnesium-Containing Compounds Anaphylaxis  . Tamiflu [Oseltamivir Phosphate] Nausea And Vomiting    intractible  . Tramadol Itching    Review of Systems Constitutional: No recent fever/chills/sweats Respiratory: No recent cough/bronchitis Cardiovascular: No chest pain Gastrointestinal: No recent nausea/vomiting/diarrhea Genitourinary: No UTI symptoms Hematologic/lymphatic:No history of coagulopathy or recent blood thinner use    Objective:    There were no vitals taken for this visit.  General:   Normal  Skin:   normal  HEENT:  Normal  Neck:  Supple without Adenopathy or Thyromegaly  Lungs:   Heart:              Breasts:   Abdomen:  Pelvis:  M/S   Extremeties:  Neuro:    clear to auscultation bilaterally   Normal without murmur   Not Examined   soft, non-tender; bowel sounds normal; no masses,  no organomegaly   Exam deferred to OR  No CVAT  Warm/Dry   Normal        05/03/2017 PELVIC:             External Genitalia: Normal external genitalia             BUS: Normal             Vagina: Normal estrogen effect             cervix: No cervical motion tenderness             Uterus: Normal size, shape,consistency, mobile, midplane, mild to moderate tenderness             Adnexa: Normal; nonpalpable nontender  RV: Normal external exam             Bladder: Nontender    Assessment:    1.  Severe dysmenorrhea 2.  Menorrhagia with regular cycle 3.  Chronic right-sided low back pain with right-sided sciatica   Plan:  Laparoscopy with peritoneal biopsies, possible excision and fulguration of endometriosis   Preop counseling:  Patient is to undergo laparoscopy with peritoneal biopsies and possible excision and fulguration of endometriosis.  Surgery scheduled for 06/20/2017.  She is understanding of the planned procedures and is aware of and is accepting of all surgical risks which include but are not limited to bleeding, infection, pelvic organ injury with need for repair, blood clot disorders, anesthesia risk, etc.  All questions have been answered.  Consent is given.  Patient is very willing to proceed with surgery as scheduled.  Brayton Mars, MD  Note: This dictation was prepared with Dragon dictation along with smaller phrase technology. Any transcriptional errors that result from this process are unintentional.

## 2017-06-14 NOTE — Progress Notes (Signed)
Preoperative history and physical was completed today. See separate H&P under admission orders  Brayton Mars, MD

## 2017-06-14 NOTE — H&P (View-Only) (Signed)
PREOPERATIVE HISTORY AND PHYSICAL  Date of surgery: 06/20/2017 Diagnosis: 1.  Dysmenorrhea 2.  Right lower quadrant/right flank pain 3.  Menorrhagia   Patient is a 32 y.o. [id number] scheduled for surgery on 06/20/2017. Status post postpartum tubal ligation.  History of severe dysmenorrhea and menorrhagia Dysmenorrhea is described as 10 out of 10 in intensity; 8 Advil a day is used for analgesia; pain is characterized as central cramping and low backache with radiation into the buttocks and occasional thighs.  Patient is not experiencing deep thrusting dyspareunia.  Patient has missed work because of heavy bleeding and pain.  She has not been taking any narcotics for pain control. Menorrhagia is described as 7 to 8 days of heavy flow with clots. No prior history of endometriosis Patient was seen in the emergency room in April 2019 for right flank pain, since resolved   Pertinent Gynecological History: Contraception-tubal ligation, postpartum  OB History    Gravida  8   Para  3   Term  3   Preterm      AB  5   Living  3     SAB  5   TAB      Ectopic      Multiple  0   Live Births  3        Obstetric Comments  H/O PTL both pregnanacies starting around 28 weeks requiring bedrest and pelvic rest - did carry to 37 and 38 weeks respectfully         No LMP recorded.    Past Medical History:  Diagnosis Date  . Vaginal Pap smear, abnormal 2012   REPEAT AND WAS NORMAL    Past Surgical History:  Procedure Laterality Date  . DILATION AND CURETTAGE OF UTERUS  2014   WS  . TUBAL LIGATION Bilateral 02/21/2015   Procedure: BILATERAL TUBAL LIGATION;  Surgeon: Brayton Mars, MD;  Location: [facility] ORS;  Service: Gynecology;  Laterality: Bilateral;    OB History  Gravida Para Term Preterm AB Living  8 3 3   5 3   SAB TAB Ectopic Multiple Live Births  5     0 3    # Outcome Date GA Lbr Len/2nd Weight Sex Delivery Anes PTL Lv  8 Term 02/20/15 [redacted]w[redacted]d / 00:48 8 lb  11.7 oz (3.96 kg) M Vag-Spont EPI  LIV  7 SAB 11/2013          6 SAB 2015          5 SAB 2014          4 SAB 2013          3 Term 02/07/08 [redacted]w[redacted]d  7 lb (3.175 kg) F Vag-Spont   LIV     Complications: Preterm contractions, second trimester  2 SAB 2007          1 Term 07/21/04 [redacted]w[redacted]d  6 lb 6 oz (2.892 kg) F Vag-Spont   LIV     Complications: Preterm contractions, second trimester    Obstetric Comments  H/O PTL both pregnanacies starting around 28 weeks requiring bedrest and pelvic rest - did carry to 37 and 38 weeks respectfully    Social History   Socioeconomic History  . Marital status: Single    Spouse name: Not on file  . Number of children: Not on file  . Years of education: Not on file  . Highest education level: Not on file  Occupational History  . Not on file  Social Needs  .  Financial resource strain: Not on file  . Food insecurity:    Worry: Not on file    Inability: Not on file  . Transportation needs:    Medical: Not on file    Non-medical: Not on file  Tobacco Use  . Smoking status: Former Smoker    Packs/day: 0.50    Years: 4.00    Pack years: 2.00    Types: Cigarettes    Last attempt to quit: 04/02/2014    Years since quitting: 3.2  . Smokeless tobacco: Never Used  Substance and Sexual Activity  . Alcohol use: No    Alcohol/week: 0.0 oz  . Drug use: No  . Sexual activity: Yes    Birth control/protection: None, Surgical  Lifestyle  . Physical activity:    Days per week: Not on file    Minutes per session: Not on file  . Stress: Not on file  Relationships  . Social connections:    Talks on phone: Not on file    Gets together: Not on file    Attends religious service: Not on file    Active member of club or organization: Not on file    Attends meetings of clubs or organizations: Not on file    Relationship status: Not on file  Other Topics Concern  . Not on file  Social History Narrative  . Not on file    Family History  Problem Relation Age  of Onset  . Breast cancer Paternal Aunt   . Diabetes Paternal Aunt   . Diabetes Maternal Grandfather   . COPD Maternal Grandmother   . Cancer Maternal Grandmother        lung  . Colon cancer Neg Hx   . Ovarian cancer Neg Hx   . Heart disease Neg Hx      (Not in a hospital admission)  Allergies  Allergen Reactions  . Magnesium-Containing Compounds Anaphylaxis  . Tamiflu [Oseltamivir Phosphate] Nausea And Vomiting    intractible  . Tramadol Itching    Review of Systems Constitutional: No recent fever/chills/sweats Respiratory: No recent cough/bronchitis Cardiovascular: No chest pain Gastrointestinal: No recent nausea/vomiting/diarrhea Genitourinary: No UTI symptoms Hematologic/lymphatic:No history of coagulopathy or recent blood thinner use    Objective:    There were no vitals taken for this visit.  General:   Normal  Skin:   normal  HEENT:  Normal  Neck:  Supple without Adenopathy or Thyromegaly  Lungs:   Heart:              Breasts:   Abdomen:  Pelvis:  M/S   Extremeties:  Neuro:    clear to auscultation bilaterally   Normal without murmur   Not Examined   soft, non-tender; bowel sounds normal; no masses,  no organomegaly   Exam deferred to OR  No CVAT  Warm/Dry   Normal        05/03/2017 PELVIC:             External Genitalia: Normal external genitalia             BUS: Normal             Vagina: Normal estrogen effect             cervix: No cervical motion tenderness             Uterus: Normal size, shape,consistency, mobile, midplane, mild to moderate tenderness             Adnexa: Normal; nonpalpable nontender  RV: Normal external exam             Bladder: Nontender    Assessment:    1.  Severe dysmenorrhea 2.  Menorrhagia with regular cycle 3.  Chronic right-sided low back pain with right-sided sciatica   Plan:  Laparoscopy with peritoneal biopsies, possible excision and fulguration of endometriosis   Preop counseling:  Patient is to undergo laparoscopy with peritoneal biopsies and possible excision and fulguration of endometriosis.  Surgery scheduled for 06/20/2017.  She is understanding of the planned procedures and is aware of and is accepting of all surgical risks which include but are not limited to bleeding, infection, pelvic organ injury with need for repair, blood clot disorders, anesthesia risk, etc.  All questions have been answered.  Consent is given.  Patient is very willing to proceed with surgery as scheduled.  Brayton Mars, MD  Note: This dictation was prepared with Dragon dictation along with smaller phrase technology. Any transcriptional errors that result from this process are unintentional.

## 2017-06-14 NOTE — Patient Instructions (Signed)
Return for postop check on 06/29/2017

## 2017-06-15 ENCOUNTER — Inpatient Hospital Stay: Admission: RE | Admit: 2017-06-15 | Payer: Medicaid Other | Source: Ambulatory Visit

## 2017-06-16 ENCOUNTER — Other Ambulatory Visit: Payer: Self-pay

## 2017-06-16 ENCOUNTER — Encounter
Admission: RE | Admit: 2017-06-16 | Discharge: 2017-06-16 | Disposition: A | Discharge status: Home / Self Care | Payer: Medicaid Other | Source: Ambulatory Visit | Attending: Obstetrics and Gynecology | Admitting: Obstetrics and Gynecology

## 2017-06-16 HISTORY — DX: Personal history of urinary calculi: Z87.442

## 2017-06-16 HISTORY — DX: Family history of other specified conditions: Z84.89

## 2017-06-16 NOTE — Patient Instructions (Signed)
Your procedure is scheduled on: 06-20-17 MONDAY Report to Same Day Surgery 2nd floor medical mall [facility] Entrance-take elevator on left to 2nd floor.  Check in with surgery information desk.) To find out your arrival time please call [phone] between 1PM - 3PM on 06-17-17 FRIDAY  Remember: Instructions that are not followed completely may result in serious medical risk, up to and including death, or upon the discretion of your surgeon and anesthesiologist your surgery may need to be rescheduled.    _x___ 1. Do not eat food after midnight the night before your procedure. NO GUM OR CANDY AFTER MIDNIGHT.  You may drink clear liquids up to 2 hours before you are scheduled to arrive at the hospital for your procedure.  Do not drink clear liquids within 2 hours of your scheduled arrival to the hospital.  Clear liquids include  --Water or Apple juice without pulp  --Clear carbohydrate beverage such as ClearFast or Gatorade  --Black Coffee or Clear Tea (No milk, no creamers, do not add anything to the coffee or Tea     __x__ 2. No Alcohol for 24 hours before or after surgery.   __x__3. No Smoking or e-cigarettes for 24 prior to surgery.  Do not use any chewable tobacco products for at least 6 hour prior to surgery   ____  4. Bring all medications with you on the day of surgery if instructed.    __x__ 5. Notify your doctor if there is any change in your medical condition     (cold, fever, infections).    x___6. On the morning of surgery brush your teeth with toothpaste and water.  You may rinse your mouth with mouth wash if you wish.  Do not swallow any toothpaste or mouthwash.   Do not wear jewelry, make-up, hairpins, clips or nail polish.  Do not wear lotions, powders, or perfumes. You may wear deodorant.  Do not shave 48 hours prior to surgery. Men may shave face and neck.  Do not bring valuables to the hospital.    [facility] is not responsible for any belongings or  valuables.               Contacts, dentures or bridgework may not be worn into surgery.  Leave your suitcase in the car. After surgery it may be brought to your room.  For patients admitted to the hospital, discharge time is determined by your treatment team.  _  Patients discharged the day of surgery will not be allowed to drive home.  You will need someone to drive you home and stay with you the night of your procedure.    Please read over the following fact sheets that you were given:   [facility] Preparing for Surgery and or MRSA Information   _x___ Take anti-hypertensive listed below, cardiac, seizure, asthma, anti-reflux and psychiatric medicines. These include:  1. YOU MAY TAKE PERCOCET THE DAY  OF SURGERY IF NEEDED WITH A SMALL SIP OF WATER  2.  3.  4.  5.  6.  ____Fleets enema or Magnesium Citrate as directed.   _x___ Use CHG Soap or sage wipes as directed on instruction sheet   ____ Use inhalers on the day of surgery and bring to hospital day of surgery  ____ Stop Metformin and Janumet 2 days prior to surgery.    ____ Take 1/2 of usual insulin dose the night before surgery and none on the morning surgery.   ____ Follow recommendations from Cardiologist,  Pulmonologist or PCP regarding stopping Aspirin, Coumadin, Plavix ,Eliquis, Effient, or Pradaxa, and Pletal.  _X___Stop Anti-inflammatories such as Advil, Aleve, IBUPROFEN, Motrin, Naproxen, Naprosyn, Goodies powders or aspirin products NOW-OK to take Tylenol OR PERCOCET IF NEEDED   ____ Stop supplements until after surgery.     ____ Bring C-Pap to the hospital.

## 2017-06-17 ENCOUNTER — Encounter
Admission: RE | Admit: 2017-06-17 | Discharge: 2017-06-17 | Disposition: A | Discharge status: Home / Self Care | Payer: Medicaid Other | Source: Ambulatory Visit | Attending: Obstetrics and Gynecology | Admitting: Obstetrics and Gynecology

## 2017-06-17 DIAGNOSIS — N920 Excessive and frequent menstruation with regular cycle: Secondary | ICD-10-CM | POA: Diagnosis not present

## 2017-06-17 DIAGNOSIS — Z01818 Encounter for other preprocedural examination: Secondary | ICD-10-CM | POA: Diagnosis present

## 2017-06-17 DIAGNOSIS — N946 Dysmenorrhea, unspecified: Secondary | ICD-10-CM | POA: Insufficient documentation

## 2017-06-17 DIAGNOSIS — M5441 Lumbago with sciatica, right side: Secondary | ICD-10-CM | POA: Diagnosis not present

## 2017-06-17 DIAGNOSIS — G8929 Other chronic pain: Secondary | ICD-10-CM | POA: Insufficient documentation

## 2017-06-17 LAB — RAPID HIV SCREEN (HIV 1/2 AB+AG)
HIV 1/2 Antibodies: NONREACTIVE
HIV-1 P24 Antigen - HIV24: NONREACTIVE

## 2017-06-17 LAB — TYPE AND SCREEN
ABO/RH(D): A POS
ANTIBODY SCREEN: NEGATIVE

## 2017-06-17 LAB — CBC WITH DIFFERENTIAL/PLATELET
BASOS ABS: 0 10*3/uL (ref 0–0.1)
Basophils Relative: 1 %
EOS PCT: 4 %
Eosinophils Absolute: 0.3 10*3/uL (ref 0–0.7)
HEMATOCRIT: 42 % (ref 35.0–47.0)
HEMOGLOBIN: 14.7 g/dL (ref 12.0–16.0)
LYMPHS ABS: 2.7 10*3/uL (ref 1.0–3.6)
LYMPHS PCT: 32 %
MCH: 30.3 pg (ref 26.0–34.0)
MCHC: 34.9 g/dL (ref 32.0–36.0)
MCV: 86.7 fL (ref 80.0–100.0)
Monocytes Absolute: 0.5 10*3/uL (ref 0.2–0.9)
Monocytes Relative: 6 %
NEUTROS ABS: 5.1 10*3/uL (ref 1.4–6.5)
Neutrophils Relative %: 57 %
Platelets: 203 10*3/uL (ref 150–440)
RBC: 4.84 MIL/uL (ref 3.80–5.20)
RDW: 12.9 % (ref 11.5–14.5)
WBC: 8.7 10*3/uL (ref 3.6–11.0)

## 2017-06-18 LAB — RPR: RPR Ser Ql: NONREACTIVE

## 2017-06-20 ENCOUNTER — Ambulatory Visit: Payer: Medicaid Other | Admitting: Certified Registered"

## 2017-06-20 ENCOUNTER — Ambulatory Visit
Admission: RE | Admit: 2017-06-20 | Discharge: 2017-06-20 | Disposition: A | Discharge status: Home / Self Care | Payer: Medicaid Other | Source: Ambulatory Visit | Attending: Obstetrics and Gynecology | Admitting: Obstetrics and Gynecology

## 2017-06-20 ENCOUNTER — Encounter
Admission: RE | Disposition: A | Discharge status: Home / Self Care | Payer: Self-pay | Source: Ambulatory Visit | Attending: Obstetrics and Gynecology

## 2017-06-20 DIAGNOSIS — Z87892 Personal history of anaphylaxis: Secondary | ICD-10-CM | POA: Diagnosis not present

## 2017-06-20 DIAGNOSIS — N736 Female pelvic peritoneal adhesions (postinfective): Secondary | ICD-10-CM | POA: Insufficient documentation

## 2017-06-20 DIAGNOSIS — Z888 Allergy status to other drugs, medicaments and biological substances status: Secondary | ICD-10-CM | POA: Diagnosis not present

## 2017-06-20 DIAGNOSIS — Z9889 Other specified postprocedural states: Secondary | ICD-10-CM

## 2017-06-20 DIAGNOSIS — Z885 Allergy status to narcotic agent status: Secondary | ICD-10-CM | POA: Insufficient documentation

## 2017-06-20 DIAGNOSIS — N803 Endometriosis of pelvic peritoneum: Secondary | ICD-10-CM

## 2017-06-20 DIAGNOSIS — Z9851 Tubal ligation status: Secondary | ICD-10-CM | POA: Insufficient documentation

## 2017-06-20 DIAGNOSIS — R1031 Right lower quadrant pain: Secondary | ICD-10-CM | POA: Insufficient documentation

## 2017-06-20 DIAGNOSIS — Z87891 Personal history of nicotine dependence: Secondary | ICD-10-CM | POA: Diagnosis not present

## 2017-06-20 DIAGNOSIS — N946 Dysmenorrhea, unspecified: Secondary | ICD-10-CM

## 2017-06-20 HISTORY — PX: LAPAROSCOPY: SHX197

## 2017-06-20 HISTORY — PX: LAPAROSCOPIC LYSIS OF ADHESIONS: SHX5905

## 2017-06-20 LAB — POCT PREGNANCY, URINE: Preg Test, Ur: NEGATIVE

## 2017-06-20 SURGERY — LAPAROSCOPY OPERATIVE
Anesthesia: General | Wound class: Clean Contaminated

## 2017-06-20 MED ORDER — ROCURONIUM BROMIDE 100 MG/10ML IV SOLN
INTRAVENOUS | Status: DC | PRN
  Administered 2017-06-20: 30 mg via INTRAVENOUS

## 2017-06-20 MED ORDER — PROPOFOL 10 MG/ML IV BOLUS
INTRAVENOUS | Status: DC | PRN
  Administered 2017-06-20: 180 mg via INTRAVENOUS

## 2017-06-20 MED ORDER — FAMOTIDINE 20 MG PO TABS
ORAL_TABLET | ORAL | Status: AC
  Filled 2017-06-20: qty 1

## 2017-06-20 MED ORDER — DEXAMETHASONE SODIUM PHOSPHATE 10 MG/ML IJ SOLN
INTRAMUSCULAR | Status: DC | PRN
  Administered 2017-06-20: 10 mg via INTRAVENOUS

## 2017-06-20 MED ORDER — OXYCODONE-ACETAMINOPHEN 5-325 MG PO TABS: 1.0000 | ORAL_TABLET | Freq: Four times a day (QID) | ORAL | 0 refills | Status: DC | PRN

## 2017-06-20 MED ORDER — IBUPROFEN 800 MG PO TABS: 800.0000 mg | ORAL_TABLET | Freq: Three times a day (TID) | ORAL | 0 refills | Status: DC

## 2017-06-20 MED ORDER — FENTANYL CITRATE (PF) 100 MCG/2ML IJ SOLN
INTRAMUSCULAR | Status: DC | PRN
  Administered 2017-06-20: 50 ug via INTRAVENOUS
  Administered 2017-06-20: 100 ug via INTRAVENOUS
  Administered 2017-06-20: 50 ug via INTRAVENOUS

## 2017-06-20 MED ORDER — ONDANSETRON HCL 4 MG/2ML IJ SOLN
INTRAMUSCULAR | Status: DC | PRN
  Administered 2017-06-20: 4 mg via INTRAVENOUS

## 2017-06-20 MED ORDER — ACETAMINOPHEN 325 MG PO TABS: 325.0000 mg | ORAL_TABLET | ORAL | Status: DC | PRN

## 2017-06-20 MED ORDER — FENTANYL CITRATE (PF) 100 MCG/2ML IJ SOLN
25.0000 ug | INTRAMUSCULAR | Status: AC | PRN
  Administered 2017-06-20 (×6): 25 ug via INTRAVENOUS

## 2017-06-20 MED ORDER — LIDOCAINE HCL (CARDIAC) PF 100 MG/5ML IV SOSY
PREFILLED_SYRINGE | INTRAVENOUS | Status: DC | PRN
  Administered 2017-06-20: 100 mg via INTRAVENOUS

## 2017-06-20 MED ORDER — FENTANYL CITRATE (PF) 100 MCG/2ML IJ SOLN
INTRAMUSCULAR | Status: AC
  Administered 2017-06-20: 25 ug via INTRAVENOUS
  Filled 2017-06-20: qty 2

## 2017-06-20 MED ORDER — KETOROLAC TROMETHAMINE 30 MG/ML IJ SOLN
INTRAMUSCULAR | Status: DC | PRN
  Administered 2017-06-20: 30 mg via INTRAVENOUS

## 2017-06-20 MED ORDER — KETOROLAC TROMETHAMINE 30 MG/ML IJ SOLN: 30.0000 mg | Freq: Once | INTRAMUSCULAR | Status: DC | PRN

## 2017-06-20 MED ORDER — MIDAZOLAM HCL 2 MG/2ML IJ SOLN
INTRAMUSCULAR | Status: DC | PRN
  Administered 2017-06-20: 2 mg via INTRAVENOUS

## 2017-06-20 MED ORDER — GLYCOPYRROLATE 0.2 MG/ML IJ SOLN
INTRAMUSCULAR | Status: DC | PRN
  Administered 2017-06-20: 0.2 mg via INTRAVENOUS

## 2017-06-20 MED ORDER — ACETAMINOPHEN 160 MG/5ML PO SOLN
325.0000 mg | ORAL | Status: DC | PRN
  Filled 2017-06-20: qty 20.3

## 2017-06-20 MED ORDER — LACTATED RINGERS IV SOLN
INTRAVENOUS | Status: DC
  Administered 2017-06-20 (×3): via INTRAVENOUS

## 2017-06-20 MED ORDER — OXYCODONE-ACETAMINOPHEN 5-325 MG PO TABS
ORAL_TABLET | ORAL | Status: AC
  Filled 2017-06-20: qty 1

## 2017-06-20 MED ORDER — SUGAMMADEX SODIUM 500 MG/5ML IV SOLN
INTRAVENOUS | Status: DC | PRN
  Administered 2017-06-20: 280 mg via INTRAVENOUS

## 2017-06-20 MED ORDER — LACTATED RINGERS IV SOLN: INTRAVENOUS | Status: DC

## 2017-06-20 MED ORDER — MEPERIDINE HCL 50 MG/ML IJ SOLN: 6.2500 mg | INTRAMUSCULAR | Status: DC | PRN

## 2017-06-20 MED ORDER — PROMETHAZINE HCL 25 MG/ML IJ SOLN: 6.2500 mg | INTRAMUSCULAR | Status: DC | PRN

## 2017-06-20 MED ORDER — ACETAMINOPHEN 10 MG/ML IV SOLN
INTRAVENOUS | Status: DC | PRN
  Administered 2017-06-20: 1000 mg via INTRAVENOUS

## 2017-06-20 MED ORDER — PHENYLEPHRINE HCL 10 MG/ML IJ SOLN
INTRAMUSCULAR | Status: DC | PRN
  Administered 2017-06-20 (×2): 50 ug via INTRAVENOUS

## 2017-06-20 MED ORDER — OXYCODONE-ACETAMINOPHEN 5-325 MG PO TABS
1.0000 | ORAL_TABLET | Freq: Four times a day (QID) | ORAL | Status: DC | PRN
  Administered 2017-06-20: 1 via ORAL

## 2017-06-20 MED ORDER — FAMOTIDINE 20 MG PO TABS
20.0000 mg | ORAL_TABLET | Freq: Once | ORAL | Status: AC
  Administered 2017-06-20: 20 mg via ORAL

## 2017-06-20 SURGICAL SUPPLY — 32 items
BLADE SURG SZ11 CARB STEEL (BLADE) ×3 IMPLANT
CANISTER SUCT 1200ML W/VALVE (MISCELLANEOUS) ×3 IMPLANT
CATH ROBINSON RED A/P 16FR (CATHETERS) ×3 IMPLANT
CHLORAPREP W/TINT 26ML (MISCELLANEOUS) ×3 IMPLANT
DERMABOND ADVANCED (GAUZE/BANDAGES/DRESSINGS) ×1
DERMABOND ADVANCED .7 DNX12 (GAUZE/BANDAGES/DRESSINGS) ×2 IMPLANT
GLOVE BIO SURGEON STRL SZ8 (GLOVE) ×9 IMPLANT
GLOVE INDICATOR 8.0 STRL GRN (GLOVE) ×9 IMPLANT
GOWN STRL REUS W/ TWL LRG LVL3 (GOWN DISPOSABLE) ×2 IMPLANT
GOWN STRL REUS W/ TWL XL LVL3 (GOWN DISPOSABLE) ×2 IMPLANT
GOWN STRL REUS W/TWL LRG LVL3 (GOWN DISPOSABLE) ×1
GOWN STRL REUS W/TWL XL LVL3 (GOWN DISPOSABLE) ×1
IRRIGATION STRYKERFLOW (MISCELLANEOUS) IMPLANT
IRRIGATOR STRYKERFLOW (MISCELLANEOUS)
IV LACTATED RINGERS 1000ML (IV SOLUTION) ×3 IMPLANT
KIT PINK PAD W/HEAD ARE REST (MISCELLANEOUS) ×3
KIT PINK PAD W/HEAD ARM REST (MISCELLANEOUS) ×2 IMPLANT
KIT TURNOVER CYSTO (KITS) ×3 IMPLANT
LABEL OR SOLS (LABEL) IMPLANT
NS IRRIG 1000ML POUR BTL (IV SOLUTION) ×3 IMPLANT
NS IRRIG 500ML POUR BTL (IV SOLUTION) ×3 IMPLANT
PACK GYN LAPAROSCOPIC (MISCELLANEOUS) ×3 IMPLANT
PAD OB MATERNITY 4.3X12.25 (PERSONAL CARE ITEMS) ×3 IMPLANT
PAD PREP 24X41 OB/GYN DISP (PERSONAL CARE ITEMS) ×3 IMPLANT
POUCH ENDO CATCH 10MM SPEC (MISCELLANEOUS) IMPLANT
SCISSORS METZENBAUM CVD 33 (INSTRUMENTS) ×3 IMPLANT
SLEEVE ENDOPATH XCEL 5M (ENDOMECHANICALS) ×3 IMPLANT
SUT PLAIN 4 0 FS 2 27 (SUTURE) IMPLANT
SUT VIC AB 0 CT2 27 (SUTURE) IMPLANT
SUT VIC AB 0 UR5 27 (SUTURE) IMPLANT
TROCAR XCEL NON-BLD 5MMX100MML (ENDOMECHANICALS) ×3 IMPLANT
TUBING INSUFFLATION (TUBING) ×3 IMPLANT

## 2017-06-20 NOTE — Anesthesia Preprocedure Evaluation (Addendum)
Anesthesia Evaluation  Patient identified by MRN, date of birth, ID band Patient awake    Reviewed: Allergy & Precautions, H&P , NPO status , reviewed documented beta blocker date and time   History of Anesthesia Complications (+) Family history of anesthesia reaction and history of anesthetic complications  Airway Mallampati: II  TM Distance: >3 FB Neck ROM: full    Dental  (+) Teeth Intact   Pulmonary former smoker,    Pulmonary exam normal        Cardiovascular negative cardio ROS Normal cardiovascular exam     Neuro/Psych  Neuromuscular disease negative psych ROS   GI/Hepatic negative GI ROS, Neg liver ROS, neg GERD  ,  Endo/Other  negative endocrine ROS  Renal/GU      Musculoskeletal   Abdominal   Peds  Hematology negative hematology ROS (+)   Anesthesia Other Findings Past Medical History: No date: Family history of adverse reaction to anesthesia     Comment:  dad-n/v No date: History of kidney stones     Comment:  h/o 2012: Vaginal Pap smear, abnormal     Comment:  REPEAT AND WAS NORMAL Past Surgical History: 2014: DILATION AND CURETTAGE OF UTERUS     Comment:  WS 02/21/2015: TUBAL LIGATION; Bilateral     Comment:  Procedure: BILATERAL TUBAL LIGATION;   Chronic back pain  Reproductive/Obstetrics                            Anesthesia Physical Anesthesia Plan  ASA: II  Anesthesia Plan: General ETT   Post-op Pain Management:    Induction: Intravenous  PONV Risk Score and Plan: 4 or greater and Ondansetron, Treatment may vary due to age or medical condition, Midazolam, Dexamethasone and Metaclopromide  Airway Management Planned:   Additional Equipment:   Intra-op Plan:   Post-operative Plan: Extubation in OR  Informed Consent: I have reviewed the patients History and Physical, chart, labs and discussed the procedure including the risks, benefits and alternatives for  the proposed anesthesia with the patient or authorized representative who has indicated his/her understanding and acceptance.   Dental Advisory Given  Plan Discussed with: CRNA  Anesthesia Plan Comments:         Anesthesia Quick Evaluation

## 2017-06-20 NOTE — Anesthesia Procedure Notes (Signed)
Procedure Name: Intubation Date/Time: 06/20/2017 11:07 AM Performed by: Timoteo Expose, CRNA Pre-anesthesia Checklist: Patient identified, Suction available, Emergency Drugs available, Patient being monitored and Timeout performed Patient Re-evaluated:Patient Re-evaluated prior to induction Oxygen Delivery Method: Circle system utilized Preoxygenation: Pre-oxygenation with 100% oxygen Induction Type: IV induction Ventilation: Mask ventilation without difficulty Tube size: 6.5 mm Number of attempts: 1 Airway Equipment and Method: Stylet Placement Confirmation: ETT inserted through vocal cords under direct vision,  positive ETCO2,  CO2 detector and breath sounds checked- equal and bilateral Secured at: 21 cm Tube secured with: Tape Dental Injury: Teeth and Oropharynx as per pre-operative assessment

## 2017-06-20 NOTE — Interval H&P Note (Signed)
History and Physical Interval Note:  06/20/2017 10:14 AM  Cassandra Duarte  has presented today for surgery, with the diagnosis of DYSMENORRHEA, MENORRHAGIA, RIGHT FLANK PAIN, CHRONIC RIGHT SIDED LOW BACK PAIN WITH RIGHT SIDED SCIATICA  The various methods of treatment have been discussed with the patient and family. After consideration of risks, benefits and other options for treatment, the patient has consented to  Procedure(s): LAPAROSCOPY DIAGNOSTIC WITH BIOPSIES (N/A) as a surgical intervention .  The patient's history has been reviewed, patient examined, no change in status, stable for surgery.  I have reviewed the patient's chart and labs.  Questions were answered to the patient's satisfaction.     Hassell Done A Bow Buntyn

## 2017-06-20 NOTE — Discharge Instructions (Signed)

## 2017-06-20 NOTE — Anesthesia Post-op Follow-up Note (Signed)
Anesthesia QCDR form completed.        

## 2017-06-20 NOTE — Transfer of Care (Signed)
Immediate Anesthesia Transfer of Care Note  Patient: Cassandra Duarte  Procedure(s) Performed: OPERATIVE DIAGNOSTIC WITH BIOPSIES (N/A ) LAPAROSCOPIC LYSIS OF ADHESIONS  Patient Location: PACU  Anesthesia Type:General  Level of Consciousness: awake  Airway & Oxygen Therapy: Patient Spontanous Breathing  Post-op Assessment: Report given to RN  Post vital signs: Reviewed  Last Vitals:  Vitals Value Taken Time  BP    Temp    Pulse    Resp    SpO2      Last Pain: There were no vitals filed for this visit.       Complications: No apparent anesthesia complications

## 2017-06-20 NOTE — Op Note (Signed)
OPERATIVE NOTE:  Cassandra Duarte PROCEDURE DATE: 06/20/2017   PREOPERATIVE DIAGNOSIS: 1.  Severe dysmenorrhea  POSTOPERATIVE DIAGNOSIS:  1.  Severe dysmenorrhea 2.  Pelvic adhesive disease  PROCEDURE: Laparoscopy with adhesio lysis (enteral lysis) and peritoneal biopsies SURGEON:  Brayton Mars, MD ASSISTANTS: Jeannie Fend, MD ANESTHESIA: General INDICATIONS: 32 y.o. [phone] with a long history of severe dysmenorrhea, menorrhagia, who presents for surgical evaluation and management.  FINDINGS: Bowel/left adnexa-ovary/fallopian tube adhesions, lysed; right adnexal cul-de-sac adhesions, lysed.  Peritoneal defects noted in the cul-de-sac and clear nodules within the cul-de-sac suspicious for endometriosis, biopsied  I/O's: Total I/O In: 1000 [I.V.:1000] Out: 195 [Urine:185; Blood:10] COUNTS:  YES SPECIMENS: 1.  Cul-de-sac biopsies ANTIBIOTIC PROPHYLAXIS:N/A COMPLICATIONS: None immediate  PROCEDURE IN DETAIL: Patient was brought placed in supine position.  General endotracheal anesthesia was induced that difficulty.  Was placed in the dorsolithotomy position using the bumblebee stirrups.  A ChloraPrep and Hibiclens abdominal perineal intravaginal prep and drape was performed in standard fashion.  Timeout was completed. Red Robinson catheter was used to drain 15 mL of urine from the bladder. Subumbilical vertical incision 5 mm in length was made.  Place a 5 mm port directly into the abdominal pelvic cavity without evidence of bowel or vascular injury.  2 other 5 mm ports were placed in the right and left lower quadrants respectively.  Bowel/left adnexal adhesions were taken down using the scissors with monopolar cautery.  This helped to restore normal anatomy.  The adhesions in the right adnexal region and cul-de-sac were likewise taken down with scissors. Cul-de-sac was noted to have the peritoneal defects and clear lesions suspicious for endometriosis, and these areas were  biopsied with biopsy forceps.  Minimal bleeding was encountered. Following completion of the survey of the pelvis and upper abdomen which also revealed normal-appearing appendix as well as liver and diaphragm, procedure was then terminated.  Pneumoperitoneum was released.  Incisions were closed with Dermabond glue.  Patient was then awakened extubated taken to recovery in satisfactory condition.  Cassandra Baltazar A. Zipporah Plants, MD, [facility]

## 2017-06-21 ENCOUNTER — Telehealth: Payer: Self-pay | Admitting: Obstetrics and Gynecology

## 2017-06-21 LAB — SURGICAL PATHOLOGY

## 2017-06-21 MED ORDER — PROMETHAZINE HCL 25 MG PO TABS: 25.0000 mg | ORAL_TABLET | Freq: Four times a day (QID) | ORAL | 0 refills | Status: DC | PRN

## 2017-06-21 MED ORDER — HYDROCODONE-ACETAMINOPHEN 5-325 MG PO TABS: 1.0000 | ORAL_TABLET | Freq: Four times a day (QID) | ORAL | 0 refills | Status: DC | PRN

## 2017-06-21 NOTE — Telephone Encounter (Signed)
The patient called and stated that she is unable to keep the pain medication down that she was given after her surgery the patient would like a cal;l back as soon as possible. Please advise.

## 2017-06-21 NOTE — Telephone Encounter (Signed)
Patient just called and stated the oxycodone is making her throw up.  She cannot take it.  She is asking for something else for her cramping.  Please advise, thank you.

## 2017-06-21 NOTE — Telephone Encounter (Signed)
Per mad ok to give Vicodin #20. Pt also requested phenergan -erx. Pt to contact office pain not relived with narcotics and ibup. Pt aware to not take tylenol. May need benadryl for allergy to tramadol.

## 2017-06-21 NOTE — Anesthesia Postprocedure Evaluation (Signed)
Anesthesia Post Note  Patient: Cassandra Duarte  Procedure(s) Performed: OPERATIVE DIAGNOSTIC WITH BIOPSIES (N/A ) LAPAROSCOPIC LYSIS OF ADHESIONS  Patient location during evaluation: PACU Anesthesia Type: General Level of consciousness: awake and alert Pain management: pain level controlled Vital Signs Assessment: post-procedure vital signs reviewed and stable Respiratory status: spontaneous breathing, nonlabored ventilation and respiratory function stable Cardiovascular status: blood pressure returned to baseline and stable Postop Assessment: no apparent nausea or vomiting Anesthetic complications: no     Last Vitals:  Vitals:   06/20/17 1245 06/20/17 1313  BP: 102/78 99/72  Pulse: 71 (!) 55  Resp: 12 14  Temp:  (!) 36.2 C  SpO2: 100% 99%    Last Pain:  Vitals:   06/21/17 0904  TempSrc:   PainSc: 7                  Marga Gramajo Harvie Heck

## 2017-06-21 NOTE — Telephone Encounter (Signed)
Pt is s/p lap x 1 day. She has taken percocet x 3 since yesterday. She has had n/v after taken each pill. She has had percocet in the past with no issues. She spoke with the pharmacist and is thinking that d/t a generic form of the med it is causing her n/v. Per mad she is to try ibup 800 qh and tylenol 1g q6. If still in pain will erx another narcotic. Will call patient later today.

## 2017-06-22 ENCOUNTER — Telehealth: Payer: Self-pay | Admitting: Obstetrics and Gynecology

## 2017-06-22 NOTE — Telephone Encounter (Signed)
Pt is having heavy bleeding only when she walks. While she is lying down it is fine. Not soaking a pad. With norco pain was under control. Unsure of lmp. She thinks this bleeding may be her cycle. Advised pt to monitor for now.

## 2017-06-22 NOTE — Telephone Encounter (Signed)
Patient called stating she started bleeding heavily last night.  She is soaking a pad an hour. She would like a call back.Thanks

## 2017-06-29 ENCOUNTER — Other Ambulatory Visit: Payer: Self-pay

## 2017-06-29 ENCOUNTER — Ambulatory Visit: Payer: Medicaid Other | Attending: Nurse Practitioner | Admitting: Nurse Practitioner

## 2017-06-29 ENCOUNTER — Encounter: Payer: Self-pay | Admitting: Nurse Practitioner

## 2017-06-29 VITALS — BP 113/83 | HR 95 | Temp 98.4°F | Resp 16 | Ht 63.0 in | Wt 153.0 lb

## 2017-06-29 DIAGNOSIS — Z87891 Personal history of nicotine dependence: Secondary | ICD-10-CM | POA: Diagnosis not present

## 2017-06-29 DIAGNOSIS — Z5181 Encounter for therapeutic drug level monitoring: Secondary | ICD-10-CM | POA: Diagnosis present

## 2017-06-29 DIAGNOSIS — M4306 Spondylolysis, lumbar region: Secondary | ICD-10-CM | POA: Insufficient documentation

## 2017-06-29 DIAGNOSIS — Z888 Allergy status to other drugs, medicaments and biological substances status: Secondary | ICD-10-CM | POA: Diagnosis not present

## 2017-06-29 DIAGNOSIS — G8929 Other chronic pain: Secondary | ICD-10-CM | POA: Diagnosis not present

## 2017-06-29 DIAGNOSIS — G894 Chronic pain syndrome: Secondary | ICD-10-CM | POA: Diagnosis not present

## 2017-06-29 DIAGNOSIS — M7918 Myalgia, other site: Secondary | ICD-10-CM | POA: Diagnosis not present

## 2017-06-29 DIAGNOSIS — M5441 Lumbago with sciatica, right side: Secondary | ICD-10-CM | POA: Insufficient documentation

## 2017-06-29 MED ORDER — TIZANIDINE HCL 4 MG PO TABS
4.0000 mg | ORAL_TABLET | Freq: Every day | ORAL | 1 refills | Status: AC
Start: 2017-06-29 — End: 2017-08-28

## 2017-06-29 MED ORDER — DICLOFENAC SODIUM 75 MG PO TBEC: 75.0000 mg | DELAYED_RELEASE_TABLET | Freq: Two times a day (BID) | ORAL | 1 refills | Status: AC

## 2017-06-29 NOTE — Progress Notes (Signed)
Safety precautions to be maintained throughout the outpatient stay will include: orient to surroundings, keep bed in low position, maintain call bell within reach at all times, provide assistance with transfer out of bed and ambulation.  

## 2017-06-29 NOTE — Patient Instructions (Signed)
  BMI Assessment: Estimated body mass index is 27.1 kg/m as calculated from the following:   Height as of this encounter: 5\' 3"  (1.6 m).   Weight as of this encounter: 153 lb (69.4 kg).  BMI interpretation table: BMI level Category Range association with higher incidence of chronic pain  <18 kg/m2 Underweight   18.5-24.9 kg/m2 Ideal body weight   25-29.9 kg/m2 Overweight Increased incidence by 20%  30-34.9 kg/m2 Obese (Class I) Increased incidence by 68%  35-39.9 kg/m2 Severe obesity (Class II) Increased incidence by 136%  >40 kg/m2 Extreme obesity (Class III) Increased incidence by 254%   Patient's current BMI Ideal Body weight  Body mass index is 27.1 kg/m. Ideal body weight: 52.4 kg (115 lb 8.3 oz) Adjusted ideal body weight: 59.2 kg (130 lb 8.2 oz)   BMI Readings from Last 4 Encounters:  06/29/17 27.10 kg/m  06/14/17 27.33 kg/m  05/26/17 27.55 kg/m  05/10/17 27.10 kg/m   Wt Readings from Last 4 Encounters:  06/29/17 153 lb (69.4 kg)  06/14/17 154 lb 4.8 oz (70 kg)  05/26/17 155 lb 8 oz (70.5 kg)  05/10/17 153 lb (69.4 kg)

## 2017-06-29 NOTE — Progress Notes (Signed)
Patient's Name: Cassandra Duarte  MRN: [id number]  Referring Provider: Volney American,*  DOB: 13-Nov-1985  PCP: Volney American, PA-C  DOS: 06/29/2017  Note by: Vevelyn Francois NP  Service setting: Ambulatory outpatient  Specialty: Interventional Pain Management  Location: [facility] (AMB) Pain Management Facility    Patient type: Established    Primary Reason(s) for Visit: Encounter for prescription drug management. (Level of risk: moderate)  CC: Back Pain (lower)  HPI  Cassandra Duarte is a 32 y.o. year old, female patient, who comes today for a medication management evaluation. She has Chronic low back pain with right-sided sciatica; Overweight; Lactational amenorrhea; Family history of breast cancer; Menorrhagia with regular cycle; Dysmenorrhea; Concentration deficit; Chronic pain syndrome; Pars defect of lumbar spine; and Myofascial pain on their problem list. Her primarily concern today is the Back Pain (lower)  Pain Assessment: Location: Lower Back Radiating: down right leg to calf Onset: More than a month ago Duration: Chronic pain Quality: Constant, Aching(pulsating) Severity: 5 /10 (subjective, self-reported pain score)  Note: Reported level is compatible with observation.                          Timing: Constant Modifying factors: movement, walking, medications, chiropractor, warm showers, ice BP: 113/83  HR: 95  Cassandra Duarte was last scheduled for an appointment on Visit date not found for medication management. During today's appointment we reviewed Cassandra Duarte's chronic pain status, as well as her outpatient medication regimen. She admits that she is was seen by a neurosurgeon and was advised not to have any surgery at this time. She admits that her left side is no longer hurting with exercises and walking. She admits that she is making dietary changes; she admits that she lost 10 pounds. She admits that her pain is 3-4 in the morning. Her pain is worse after working with her  72 year old son.   The patient  reports that she does not use drugs. Her body mass index is 27.1 kg/m.  Further details on both, my assessment(s), as well as the proposed treatment plan, please see below.  Controlled Substance Pharmacotherapy Assessment REMS (Risk Evaluation and Mitigation Strategy)  Analgesic: None MME/day: 0 mg/day.  Rise Patience, RN  06/29/2017 10:18 AM  Sign at close encounter Safety precautions to be maintained throughout the outpatient stay will include: orient to surroundings, keep bed in low position, maintain call bell within reach at all times, provide assistance with transfer out of bed and ambulation.    Pharmacokinetics: Liberation and absorption (onset of action): WNL Distribution (time to peak effect): WNL Metabolism and excretion (duration of action): WNL         Pharmacodynamics: Desired effects: Analgesia: Cassandra Duarte reports >50% benefit. Functional ability: Patient reports that medication allows her to accomplish basic ADLs Clinically meaningful improvement in function (CMIF): Sustained CMIF goals met Perceived effectiveness: Described as relatively effective, allowing for increase in activities of daily living (ADL) Undesirable effects: Side-effects or Adverse reactions: None reported Monitoring: Stem PMP: Online review of the past 82-monthperiod conducted. Compliant with practice rules and regulations Last UDS on record: No results found for: SUMMARY UDS interpretation: Compliant          Medication Assessment Form: Reviewed. Patient indicates being compliant with therapy Treatment compliance: Compliant Risk Assessment Profile: Aberrant behavior: See prior evaluations. None observed or detected today Comorbid factors increasing risk of overdose: See prior notes. No additional risks detected today Risk  of substance use disorder (SUD): Low Opioid Risk Tool - 06/29/17 1016      Family History of Substance Abuse   Alcohol  Negative    Illegal  Drugs  Negative    Rx Drugs  Negative      Personal History of Substance Abuse   Alcohol  Negative    Illegal Drugs  Negative    Rx Drugs  Negative      Age   Age between 71-45 years   Yes      History of Preadolescent Sexual Abuse   History of Preadolescent Sexual Abuse  Negative or Female      Psychological Disease   Psychological Disease  Negative    Depression  Negative      Total Score   Opioid Risk Tool Scoring  1    Opioid Risk Interpretation  Low Risk      ORT Scoring interpretation table:  Score <3 = Low Risk for SUD  Score between 4-7 = Moderate Risk for SUD  Score >8 = High Risk for Opioid Abuse   Risk Mitigation Strategies:  Patient Counseling: Covered Patient-Prescriber Agreement (PPA): Present and active  Notification to other healthcare providers: Done  Pharmacologic Plan: No change in therapy, at this time.             Laboratory Chemistry  Inflammation Markers (CRP: Acute Phase) (ESR: Chronic Phase) No results found for: CRP, ESRSEDRATE, LATICACIDVEN                       Rheumatology Markers No results found for: RF, ANA, LABURIC, URICUR, LYMEIGGIGMAB, LYMEABIGMQN, HLAB27                      Renal Function Markers Lab Results  Component Value Date   BUN 15 05/26/2017   CREATININE 0.71 05/26/2017   BCR 21 05/26/2017   GFRAA 131 05/26/2017   GFRNONAA 114 05/26/2017                             Hepatic Function Markers Lab Results  Component Value Date   AST 25 05/26/2017   ALT 31 05/26/2017   ALBUMIN 4.4 05/26/2017   ALKPHOS 70 05/26/2017   LIPASE 29 04/28/2017                        Electrolytes Lab Results  Component Value Date   NA 138 05/26/2017   K 3.8 05/26/2017   CL 103 05/26/2017   CALCIUM 8.7 05/26/2017                        Neuropathy Markers Lab Results  Component Value Date   HGBA1C 5.5 11/20/2015   HIV NON REACTIVE 06/17/2017                        Bone Pathology Markers Lab Results  Component Value Date    VD25OH 14.3 (L) 03/03/2017                         Coagulation Parameters Lab Results  Component Value Date   PLT 203 06/17/2017                        Cardiovascular Markers Lab Results  Component Value Date  HGB 14.7 06/17/2017   HCT 42.0 06/17/2017                         CA Markers No results found for: CEA, CA125, LABCA2                      Note: Lab results reviewed.  Recent Diagnostic Imaging Results  CT Renal Stone Study CLINICAL DATA:  Right flank pain.  Stone disease suspected.  EXAM: CT ABDOMEN AND PELVIS WITHOUT CONTRAST  TECHNIQUE: Multidetector CT imaging of the abdomen and pelvis was performed following the standard protocol without IV contrast.  COMPARISON:  CT 02/28/2017  FINDINGS: Lower chest: Mild hypoventilatory change at the lung bases. No consolidation. No pleural fluid.  Hepatobiliary: Right lobe liver lesion on prior exam is not as well visualized, the central scar is seen and unchanged. 6 mm subcapsular cyst in the right hepatic lobe. No gallstones, pericholecystic inflammation or biliary dilatation. Gallbladder partially contracted.  Pancreas: No ductal dilatation or inflammation.  Spleen: Single splenic calcification, unchanged. Spleen is normal in size with small splenules anteriorly.  Adrenals/Urinary Tract: Normal adrenal glands. No renal stones or obstructive uropathy. No perinephric edema. No hydronephrosis. Both ureters are decompressed without stones along the course. Urinary bladder is partially distended. No bladder wall thickening or stone.  Stomach/Bowel: Stomach is within normal limits. Appendix appears normal. No evidence of bowel wall thickening, distention, or inflammatory changes. Moderate colonic stool burden without colonic wall thickening.  Vascular/Lymphatic: Normal course and caliber of abdominal vessels. No enlarged abdominal or pelvic lymph nodes.  Reproductive: Uterus is unremarkable. The ovaries are  symmetric in size. Mild generalized stranding in the pelvis and low adnexa. No focal fluid collection.  Other: No ascites or free air.  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 There are no acute or suspicious osseous abnormalities.  IMPRESSION: 1. No renal stone or obstructive uropathy. 2. Generalized stranding about the lower uterus and adnexa which can be seen with pelvic inflammatory disease. No focal fluid collection/abscess. 3. Known liver lesion is not well characterized on current exam given lack contrast, the central scar is unchanged.  Electronically Signed   By: Jeb Levering M.D.   On: 04/29/2017 03:32  Complexity Note: Imaging results reviewed. Results shared with Cassandra Duarte, using Layman's terms.                         Meds   Current Outpatient Medications:  .  ibuprofen (ADVIL,MOTRIN) 800 MG tablet, Take 1 tablet (800 mg total) by mouth 3 (three) times daily., Disp: 30 tablet, Rfl: 0 .  promethazine (PHENERGAN) 25 MG tablet, Take 1 tablet (25 mg total) by mouth every 6 (six) hours as needed for nausea or vomiting., Disp: 20 tablet, Rfl: 0 .  Vitamin D, Ergocalciferol, (DRISDOL) 50000 units CAPS capsule, Take 50,000 Units by mouth every Thursday. , Disp: , Rfl: 0 .  diclofenac (VOLTAREN) 75 MG EC tablet, Take 1 tablet (75 mg total) by mouth 2 (two) times daily., Disp: 60 tablet, Rfl: 1 .  tiZANidine (ZANAFLEX) 4 MG tablet, Take 1 tablet (4 mg total) by mouth at bedtime., Disp: 30 tablet, Rfl: 1  ROS  Constitutional: Denies any fever or chills Gastrointestinal: No reported hemesis, hematochezia, vomiting, or acute GI distress Musculoskeletal: Denies any acute onset joint swelling, redness, loss of ROM, or weakness Neurological: No reported  episodes of acute onset apraxia, aphasia, dysarthria, agnosia, amnesia, paralysis, loss of coordination, or loss of consciousness  Allergies  Cassandra Duarte is  allergic to magnesium-containing compounds; tamiflu [oseltamivir phosphate]; and tramadol.  PFSH  Drug: Cassandra Duarte  reports that she does not use drugs. Alcohol:  reports that she does not drink alcohol. Tobacco:  reports that she quit smoking about 3 years ago. Her smoking use included cigarettes. She has a 2.00 pack-year smoking history. She has never used smokeless tobacco. Medical:  has a past medical history of Family history of adverse reaction to anesthesia, History of kidney stones, and Vaginal Pap smear, abnormal (2012). Surgical: Cassandra Duarte  has a past surgical history that includes Dilation and curettage of uterus (2014); Tubal ligation (Bilateral, 02/21/2015); laparoscopy (N/A, 06/20/2017); and Laparoscopic lysis of adhesions (06/20/2017). Family: family history includes Breast cancer in her paternal aunt; COPD in her maternal grandmother; Cancer in her maternal grandmother; Diabetes in her maternal grandfather and paternal aunt.  Constitutional Exam  General appearance: Well nourished, well developed, and well hydrated. In no apparent acute distress Vitals:   06/29/17 1009  BP: 113/83  Pulse: 95  Resp: 16  Temp: 98.4 F (36.9 C)  TempSrc: Oral  SpO2: 100%  Weight: 153 lb (69.4 kg)  Height: _0  (1.6 m)  Psych/Mental status: Alert, oriented x 3 (person, place, & time)       Eyes: PERLA Respiratory: No evidence of acute respiratory distress  Lumbar Spine Area Exam  Skin & Axial Inspection: No masses, redness, or swelling Alignment: Symmetrical Functional ROM: Unrestricted ROM       Stability: No instability detected Muscle Tone/Strength: Functionally intact. No obvious neuro-muscular anomalies detected. Sensory (Neurological): Unimpaired Palpation: Complains of area being tender to palpation       Provocative Tests: Lumbar Hyperextension/rotation test: (+)       Lumbar quadrant test (Kemp's test): deferred today       Lumbar Lateral bending test: (+)       Patrick's  Maneuver: deferred today                   FABER test: deferred today       Thigh-thrust test: deferred today       S-I compression test: deferred today       S-I distraction test: deferred today        Gait & Posture Assessment  Ambulation: Unassisted Gait: Relatively normal for age and body habitus Posture: WNL   Lower Extremity Exam    Side: Right lower extremity  Side: Left lower extremity  Stability: No instability observed          Stability: No instability observed          Skin & Extremity Inspection: Skin color, temperature, and hair growth are WNL. No peripheral edema or cyanosis. No masses, redness, swelling, asymmetry, or associated skin lesions. No contractures.  Skin & Extremity Inspection: Skin color, temperature, and hair growth are WNL. No peripheral edema or cyanosis. No masses, redness, swelling, asymmetry, or associated skin lesions. No contractures.  Functional ROM: Unrestricted ROM                  Functional ROM: Unrestricted ROM                  Muscle Tone/Strength: Functionally intact. No obvious neuro-muscular anomalies detected.  Muscle Tone/Strength: Functionally intact. No obvious neuro-muscular anomalies detected.  Sensory (Neurological): Unimpaired  Sensory (Neurological): Unimpaired  Palpation: Tender  Palpation: No palpable anomalies   Assessment  Primary Diagnosis & Pertinent Problem List: The primary encounter diagnosis was Chronic right-sided low back pain with right-sided sciatica. Diagnoses of Pars defect of lumbar spine, Myofascial pain, and Chronic pain syndrome were also pertinent to this visit.  Status Diagnosis  Persistent Controlled Persistent 1. Chronic right-sided low back pain with right-sided sciatica   2. Pars defect of lumbar spine   3. Myofascial pain   4. Chronic pain syndrome     Problems updated and reviewed during this visit: Problem  Chronic Pain Syndrome  Pars Defect of Lumbar Spine  Myofascial Pain   Plan of Care   Pharmacotherapy (Medications Ordered): Meds ordered this encounter  Medications  . tiZANidine (ZANAFLEX) 4 MG tablet    Sig: Take 1 tablet (4 mg total) by mouth at bedtime.    Dispense:  30 tablet    Refill:  1    Do not place this medication, or any other prescription from our practice, on "Automatic Refill". Patient may have prescription filled one day early if pharmacy is closed on scheduled refill date.    Order Specific Question:   Supervising Provider    Answer:   Milinda Pointer [phone]  . diclofenac (VOLTAREN) 75 MG EC tablet    Sig: Take 1 tablet (75 mg total) by mouth 2 (two) times daily.    Dispense:  60 tablet    Refill:  1    Order Specific Question:   Supervising Provider    Answer:   Milinda Pointer [phone]   New Prescriptions   TIZANIDINE (ZANAFLEX) 4 MG TABLET    Take 1 tablet (4 mg total) by mouth at bedtime.   Medications administered today: Cassandra Duarte had no medications administered during this visit. Lab-work, procedure(s), and/or referral(s): No orders of the defined types were placed in this encounter.  Imaging and/or referral(s): None  Interventional therapies: Planned, scheduled, and/or pending:   Not at this time. Consider Trigger point injection to right lumbar if above not effective Continue to encourage life style modification and weight loss in abdomen     Provider-requested follow-up: Return in about 6 months (around 12/29/2017) for MedMgmt with Me Donella Stade Edison Pace).  Future Appointments  Date Time Provider [place]  07/06/2017  8:30 AM Defrancesco, Alanda Slim, MD [facility] None  08/19/2017 10:00 AM Edgardo Roys, PsyD [facility] CPR  08/26/2017 11:00 AM Edgardo Roys, PsyD [facility] CPR  09/02/2017 10:00 AM Edgardo Roys, PsyD [facility] CPR  12/29/2017  9:45 AM Vevelyn Francois, NP [facility] None   Primary Care Physician: Volney American, PA-C Location: [facility] Outpatient Pain Management Facility Note by: Vevelyn Francois NP Date: 06/29/2017; Time: 11:19 AM  Pain Score Disclaimer: We use the NRS-11 scale. This is a self-reported, subjective measurement of pain severity with only modest accuracy. It is used primarily to identify changes within a particular patient. It must be understood that outpatient pain scales are significantly less accurate that those used for research, where they can be applied under ideal controlled circumstances with minimal exposure to variables. In reality, the score is likely to be a combination of pain intensity and pain affect, where pain affect describes the degree of emotional arousal or changes in action readiness caused by the sensory experience of pain. Factors such as social and work situation, setting, emotional state, anxiety levels, expectation, and prior pain experience may influence pain perception and show large inter-individual differences that may also be affected by time  variables.  Patient instructions provided during this appointment: Patient Instructions    BMI Assessment: Estimated body mass index is 27.1 kg/m as calculated from the following:   Height as of this encounter: _0  (1.6 m).   Weight as of this encounter: 153 lb (69.4 kg).  BMI interpretation table: BMI level Category Range association with higher incidence of chronic pain  <18 kg/m2 Underweight   18.5-24.9 kg/m2 Ideal body weight   25-29.9 kg/m2 Overweight Increased incidence by 20%  30-34.9 kg/m2 Obese (Class I) Increased incidence by 68%  35-39.9 kg/m2 Severe obesity (Class II) Increased incidence by 136%  >40 kg/m2 Extreme obesity (Class III) Increased incidence by 254%   Patient's current BMI Ideal Body weight  Body mass index is 27.1 kg/m. Ideal body weight: 52.4 kg (115 lb 8.3 oz) Adjusted ideal body weight: 59.2 kg (130 lb 8.2 oz)   BMI Readings from Last 4 Encounters:  06/29/17 27.10 kg/m  06/14/17 27.33 kg/m  05/26/17 27.55 kg/m  05/10/17 27.10 kg/m   Wt Readings from  Last 4 Encounters:  06/29/17 153 lb (69.4 kg)  06/14/17 154 lb 4.8 oz (70 kg)  05/26/17 155 lb 8 oz (70.5 kg)  05/10/17 153 lb (69.4 kg)

## 2017-07-06 ENCOUNTER — Encounter: Payer: Medicaid Other | Admitting: Obstetrics and Gynecology

## 2017-07-13 ENCOUNTER — Ambulatory Visit (INDEPENDENT_AMBULATORY_CARE_PROVIDER_SITE_OTHER): Payer: Medicaid Other | Admitting: Obstetrics and Gynecology

## 2017-07-13 ENCOUNTER — Encounter: Payer: Self-pay | Admitting: Obstetrics and Gynecology

## 2017-07-13 VITALS — BP 106/66 | HR 90 | Ht 63.0 in | Wt 156.3 lb

## 2017-07-13 DIAGNOSIS — M5441 Lumbago with sciatica, right side: Secondary | ICD-10-CM

## 2017-07-13 DIAGNOSIS — Z9889 Other specified postprocedural states: Secondary | ICD-10-CM | POA: Insufficient documentation

## 2017-07-13 DIAGNOSIS — G8929 Other chronic pain: Secondary | ICD-10-CM

## 2017-07-13 DIAGNOSIS — N920 Excessive and frequent menstruation with regular cycle: Secondary | ICD-10-CM

## 2017-07-13 DIAGNOSIS — Z09 Encounter for follow-up examination after completed treatment for conditions other than malignant neoplasm: Secondary | ICD-10-CM

## 2017-07-13 MED ORDER — NORETHINDRONE ACET-ETHINYL EST 1-20 MG-MCG PO TABS: ORAL_TABLET | ORAL | 4 refills | Status: DC

## 2017-07-13 NOTE — Patient Instructions (Signed)
1.  Begin taking birth control pills continuously 84/7 regimen.  This means taking the active birth control pills for 84 days in a row followed by a week break, and then repeat the cycle 2.  Maintain menstrual calendar monitoring for pelvic pain and abnormal uterine bleeding 3.  Return in 3 months for follow-up

## 2017-07-13 NOTE — Progress Notes (Signed)
Chief complaint: 1.  Postop check 2.  Status post laparoscopy with adhesio lysis and peritoneal biopsies  Surgery was completed on 06/20/2017. Findings included adhesions between the bowel/left adnexa-ovary/fallopian tube which were lysed.  Right adnexal/cul-de-sac adhesions were also lysed.  There were peritoneal defects noted in the cul-de-sac and clear nodules within the cul-de-sac that were suspicious for endometriosis; these were biopsied  Pathology from surgery:  Surgical Pathology  CASE: [phone]  PATIENT: [facility]  Surgical Pathology Report      SPECIMEN SUBMITTED:  A. Cul de sac; bx   CLINICAL HISTORY:  None provided   PRE-OPERATIVE DIAGNOSIS:  Dysmenorrhea, menorrhagia, right flank pain, chronic right sided low  back pain with right sided sciatica   POST-OPERATIVE DIAGNOSIS:  Same as pre-op      DIAGNOSIS:  A. CUL-DE-SAC; BIOPSY:  - MESOTHELIAL LINED FIBROADIPOSE TISSUE CALCIFICATIONS, MOST CONSISTENT  WITH FIBROUS ADHESION.      Patient reports normal bowel bladder function at this time.  She is not experiencing any significant pelvic pain.  She is not experiencing any abnormal uterine bleeding.  OBJECTIVE: Blood pressure 106/66, pulse 90, height 5\' 3"  (1.6 m), weight 156 lb 4.8 oz (70.9 kg), last menstrual period 06/28/2017, currently breastfeeding. Pleasant female no acute distress.  Alert and oriented.  Affect is appropriate. Abdomen: Soft, nontender; laparoscopy incisions are well approximated and healed without evidence of hernia Pelvic: Deferred  ASSESSMENT: 1.  Normal postop check status post laparoscopic adhesiolysis for pelvic adhesive disease 2.  Possible endometriosis 3. History of severe dysmenorrhea and menorrhagia 4.  History of chronic right-sided low back pain and right-sided sciatica  PLAN: 1.  Resume activities as tolerated without restrictions 2.  Contraception-tubal ligation 3.  Options of management for menorrhagia and  severe dysmenorrhea have been reviewed.  Patient declines Mirena IUD.  Patient is willing to go on trial of continuous oral contraceptives in an 84/7 regimen 4.  Maintain menstrual calendar monitoring and pain monitoring 5.  Return in 3 months for follow-up  Brayton Mars, MD  Note: This dictation was prepared with Dragon dictation along with smaller phrase technology. Any transcriptional errors that result from this process are unintentional.

## 2017-07-20 ENCOUNTER — Telehealth: Payer: Self-pay | Admitting: Obstetrics and Gynecology

## 2017-07-20 MED ORDER — LIDOCAINE 5 % EX OINT: 1.0000 "application " | TOPICAL_OINTMENT | CUTANEOUS | 0 refills | Status: DC | PRN

## 2017-07-20 NOTE — Telephone Encounter (Signed)
Pt states lmp- 07/19/2017. Bleeding heavy - changing q 2h. She started ocp(micorgestin 1/20) on 6/28. Advised it is not uncommon to have aub for the first 3 months on new pills. Encouraged pt to take pills same time qd. Monitor bleeding for now. She has h/o herpes. She is currently having an outbreak. On valtrex. Given lidocaine jelly. Pt is also requesting pain meds. Advised ibup and tyelnol but she states that does not help. Will send message to [facility].

## 2017-07-20 NOTE — Telephone Encounter (Signed)
The patient is requesting a call about her bleeding, no other info given, good contact number is [phone] and has voicemail, please advise, thanks.

## 2017-07-22 ENCOUNTER — Telehealth: Payer: Self-pay | Admitting: Obstetrics and Gynecology

## 2017-07-22 DIAGNOSIS — N946 Dysmenorrhea, unspecified: Secondary | ICD-10-CM | POA: Diagnosis not present

## 2017-07-22 DIAGNOSIS — B009 Herpesviral infection, unspecified: Secondary | ICD-10-CM | POA: Diagnosis not present

## 2017-07-22 DIAGNOSIS — N924 Excessive bleeding in the premenopausal period: Secondary | ICD-10-CM | POA: Diagnosis not present

## 2017-07-22 MED ORDER — LIDOCAINE 5 % EX OINT: 1.0000 "application " | TOPICAL_OINTMENT | CUTANEOUS | 0 refills | Status: DC | PRN

## 2017-07-22 NOTE — Telephone Encounter (Signed)
Pt aware. Sent lidocaine jelly to [facility]. Pt requested narcotics. Advised I sent message to [facility]. I am unable to give narcotics.

## 2017-07-22 NOTE — Telephone Encounter (Signed)
The patient called and stated that her pharmacy informed her that they done have the prescription on hand and it would take a while to get it. The patient is asking to have her prescription sent to a different pharmacy, Her pharmacy of choice is [facility] on [address]. The patient would also like to have a call back if possible. Please advise.

## 2017-07-26 ENCOUNTER — Telehealth: Payer: Self-pay | Admitting: Obstetrics and Gynecology

## 2017-07-26 NOTE — Telephone Encounter (Signed)
Pt aware.If sx do not improve in the next 3 weeks pt is to make an appt to be seen.

## 2017-07-26 NOTE — Telephone Encounter (Signed)
Patient called stating she is still bleeding heavily. She would like a call back. Thanks

## 2017-07-26 NOTE — Telephone Encounter (Signed)
See previous telephone encounter.

## 2017-07-27 ENCOUNTER — Ambulatory Visit: Payer: Medicaid Other | Admitting: Family Medicine

## 2017-07-27 DIAGNOSIS — R8290 Unspecified abnormal findings in urine: Secondary | ICD-10-CM | POA: Diagnosis not present

## 2017-07-27 DIAGNOSIS — N200 Calculus of kidney: Secondary | ICD-10-CM | POA: Diagnosis not present

## 2017-07-29 ENCOUNTER — Ambulatory Visit: Payer: Medicaid Other | Admitting: Physician Assistant

## 2017-07-29 ENCOUNTER — Encounter: Payer: Self-pay | Admitting: Physician Assistant

## 2017-07-29 VITALS — BP 119/76 | HR 78 | Temp 97.5°F | Wt 154.1 lb

## 2017-07-29 DIAGNOSIS — R109 Unspecified abdominal pain: Secondary | ICD-10-CM | POA: Diagnosis not present

## 2017-07-29 DIAGNOSIS — R300 Dysuria: Secondary | ICD-10-CM | POA: Diagnosis not present

## 2017-07-29 DIAGNOSIS — Z765 Malingerer [conscious simulation]: Secondary | ICD-10-CM | POA: Diagnosis not present

## 2017-07-29 LAB — UA/M W/RFLX CULTURE, ROUTINE
Bilirubin, UA: NEGATIVE
Glucose, UA: NEGATIVE
Ketones, UA: NEGATIVE
Nitrite, UA: NEGATIVE
Specific Gravity, UA: 1.02 (ref 1.005–1.030)
Urobilinogen, Ur: 2 mg/dL — ABNORMAL HIGH (ref 0.2–1.0)
pH, UA: 6.5 (ref 5.0–7.5)

## 2017-07-29 LAB — MICROSCOPIC EXAMINATION: Epithelial Cells (non renal): >10 /hpf — AB (ref 0–10)

## 2017-07-29 MED ORDER — MELOXICAM 15 MG PO TABS: 15.0000 mg | ORAL_TABLET | Freq: Every day | ORAL | 0 refills | Status: AC

## 2017-07-29 NOTE — Patient Instructions (Signed)
Flank Pain Flank pain is pain in your side. The flank is the area of your side between your upper belly (abdomen) and your back. The pain may occur over a short period of time (acute) or may be long-term or come back often (chronic). It may be mild or very bad. Pain in this area can be caused by many different things. Follow these instructions at home:  Rest as told by your doctor.  Drink enough fluid to keep your pee (urine) clear or pale yellow.  Take over-the-counter and prescription medicines only as told by your doctor.  Keep all follow-up visits as told by your doctor. This is important. Contact a doctor if:  Medicine does not help your pain.  You have new symptoms.  Your pain gets worse.  You have a fever.  Your symptoms last longer than 2-3 days. Get help right away if:  Your tummy hurts or is swollen.  You are short of breath.  You feel sick to your stomach (nauseous) and it does not go away.  You cannot stop throwing up (vomiting).  You feel like you will pass out or you do pass out (faint).  You have blood in your pee.  You have a fever and your symptoms suddenly get worse. This information is not intended to replace advice given to you by your health care provider. Make sure you discuss any questions you have with your health care provider. Document Released: 10/14/2007 Document Revised: 09/26/2015 Document Reviewed: 10/08/2014 Elsevier Interactive Patient Education  2018 [employer]  

## 2017-07-29 NOTE — Progress Notes (Addendum)
Subjective:    Patient ID: Cassandra Duarte, female    DOB: 31-Dec-1985, 32 y.o.   MRN: [id number]  Cassandra Duarte is a 32 y.o. female presenting on 07/29/2017 for Flank Pain (Seen Urgent Care Tuesday, stated she had kidney stone. No verification. ) and Dysuria   HPI   Cassandra Duarte with history of chronic low back pain, chronic pain syndrome, s/p negative laparoscopy for endometriosis presenting today for left flank pain ongoing x 3 days. She says she has a history of kidney stones, though never captured on image. She says she gets them all the time and knows how they feel, she just passes them before they can be imaged. 04/2017 CT renal study negative for stones.  She went to [facility] on 07/27/2017 in [place], [place], the records from which are available to me today. She was treated for suspected kidney stones with Flomax, Norco, zofran, and bactrim in case of infection. There was some blood on her UA. She reports she has taken these medications except for the bactrim and continues to experience pain. She says she feels the stone has moved from her kidney and is moving downward. She reports a KUB was not obtained at the urgent care. She denies fevers, chills, vomiting, vaginal discharge.    Social History   Tobacco Use  . Smoking status: Former Smoker    Packs/day: 0.50    Years: 4.00    Pack years: 2.00    Types: Cigarettes    Last attempt to quit: 04/02/2014    Years since quitting: 3.3  . Smokeless tobacco: Never Used  Substance Use Topics  . Alcohol use: No    Alcohol/week: 0.0 oz  . Drug use: No    Review of Systems Per HPI unless specifically indicated above     Objective:    BP 119/76 (BP Location: Left Arm, Patient Position: Sitting, Cuff Size: Normal)   Pulse 78   Temp (!) 97.5 F (36.4 C) (Tympanic)   Wt 154 lb 1.6 oz (69.9 kg)   SpO2 99%   BMI 27.30 kg/m   Wt Readings from Last 3 Encounters:  07/29/17 154 lb 1.6 oz (69.9 kg)    07/13/17 156 lb 4.8 oz (70.9 kg)  06/29/17 153 lb (69.4 kg)    Physical Exam  Constitutional: She is oriented to person, place, and time. She appears well-developed and well-nourished.  Cardiovascular: Normal rate and regular rhythm.  Pulmonary/Chest: Effort normal and breath sounds normal.  Abdominal: Soft. Bowel sounds are normal. There is no CVA tenderness.  Neurological: She is alert and oriented to person, place, and time.  Skin: Skin is warm and dry.  Psychiatric: She has a normal mood and affect. Her behavior is normal.   Results for orders placed or performed in visit on 07/29/17  Microscopic Examination  Result Value Ref Range   WBC, UA 6-10 (A) 0 - 5 /hpf   RBC, UA 0-2 0 - 2 /hpf   Epithelial Cells (non renal) >10 (A) 0 - 10 /hpf   Mucus, UA Present Not Estab.   Bacteria, UA Few None seen/Few  UA/M w/rflx Culture, Routine  Result Value Ref Range   Specific Gravity, UA 1.020 1.005 - 1.030   pH, UA 6.5 5.0 - 7.5   Color, UA Orange Yellow   Appearance Ur Turbid (A) Clear   Leukocytes, UA Trace (A) Negative   Protein, UA 1+ (A) Negative/Trace   Glucose, UA Negative Negative   Ketones,  UA Negative Negative   RBC, UA 3+ (A) Negative   Bilirubin, UA Negative Negative   Urobilinogen, Ur 2.0 (H) 0.2 - 1.0 mg/dL   Nitrite, UA Negative Negative   Microscopic Examination See below:       Assessment & Plan:  1. Dysuria  No blood on microscopy today, though there are few bacteria. Patient does not seem altogether distressed or in pain today in clinic, certainly not in proportion to the pain caused by a ureteral stone. She has no physical exam findings, no blood on urine microscopy. There is a pending culture from Urgent Care and she does not want to repeat it here today, which I think is reasonable.   On controlled substance search in [place], she received a self reported 6 tablets of Norco on 07/27/2017. She also received 9 tablets of Tylenol 3 from a Dr. Vickki Muff on  07/22/2017. She does not report this to me nor does she report being seen on that day. I ask her who gave her Tylenol 3 and she immediately becomes very puzzled. She ceases to make eye contact with me and puts her hand to her chin in thought and says "Did I get Tylenol 3? Now where was that from. Hold on let me think." She cannot remember who gave it to her and I told her it was prescribed by Dr. Valla Leaver. She has to think some more on this and then finally remembers that he may be an urgent care doctor, but she has to think about why she went there. After a minute, she says she remembers that she had a an outbreak of genital sores and went to urgent care because her gynecologist couldn't see her. She says however she did NOT pick that medication up. I ask her explicitly if she was sure she did not pick it up, and she says no. I ask her again and she says she certainly did not. I told her she picked it up from [place] on [address] in [place] on 07/22/2017 as that is the only way it populates in the controlled substance search. She again looks very puzzled and has to think for some more about this, and then remembers that she DID pick it up but it is still in her cabinet at home, she just hasn't taken it.  I tell her we can get a KUB at our facility. She says it likely won't be today because she is busy and her daughter broke her arm. She apologizes that she couldn't remember anything, has been overwhelmed with the care of her daughter. The order is there for her if she wishes. She should keep bactrim on hand and wait for urgent care to result the culture.  - UA/M w/rflx Culture, Routine - DG Abd 1 View; Future - meloxicam (MOBIC) 15 MG tablet; Take 1 tablet (15 mg total) by mouth daily for 14 days.  Dispense: 14 tablet; Refill: 0  2. Flank pain  Low suspicion for renal stone. Patient is not getting imaging today. She says she itches with toradol but takes ibuprofen without issue. Mobic as below for pain  relief, she has nausea medication.   - DG Abd 1 View; Future - meloxicam (MOBIC) 15 MG tablet; Take 1 tablet (15 mg total) by mouth daily for 14 days.  Dispense: 14 tablet; Refill: 0  3. Drug Seeking Behavior  Drug seeking behavior today in office. Physical exam findings do not support proposed diagnosis and patient not eager or willing  to get confirmatory imaging today, despite her reporting severe pain. She is evasive about the Tylenol 3 prescription and initially is untruthful about whether she picked it up, remembering that she picked it up only when I say I can tell that she has. I have noticed she has requested narcotic pain medication multiple times from her OBGYN and she has repeatedly been denied these requests. I will not give her any controlled substances without definitive exam findings.  I have spent 25 minutes with this patient, >50% of which was spent on counseling and coordination of care.    Follow up plan: Return if symptoms worsen or fail to improve.  Carles Collet, PA-C [facility] 07/29/2017, 1:13 PM

## 2017-08-13 ENCOUNTER — Encounter: Payer: Self-pay | Admitting: Psychology

## 2017-08-13 NOTE — Progress Notes (Signed)
Neuropsychological Consultation   Patient:   Cassandra Duarte   DOB:   1985/12/12  MR Number:  [id number]  Location:  [facility] [address], [place] [id number] [place] [address] Dept: [phone]           Date of Service:   06/06/2017  Start Time:   1 End Time:   2 PM  Provider/Observer:  Ilean Skill, Psy.D.       Clinical Neuropsychologist       Billing Code/Service: [phone] 4 Units  Chief Complaint:    Cassandra Duarte is a 32 year old female who was referred for a neuropsychological evaluation to assess possible issues related to adult residual attention deficit disorder and address difficulties with learning and retention.  The patient reports that she has been having trouble staying focused on schoolwork or any other particular task.  She also has difficulty sitting still for long periods of time.  The patient reports that she is tried to go back to on several occasions and ends up falling behind and not been able to keep up with class work.  The patient describes herself as being impulsive and hyper even in kindergarten and had trouble in high school for fighting and arguing.  The patient denies any anxiety or depression.  The patient also has a history of chronic pain syndrome diagnoses as well as abnormalities found on imaging of her back.  She has seen pain specialist issues.  Reason for Service:  Cassandra Duarte was referred for neurological evaluation to facilitate the differential diagnostic question.  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  The patient reports a lifelong difficulty with attention and concentration as well as impulsivity and hypokinesis it she reports these difficulties go back to kindergarten and that her mother has some the patient that there have always been  difficulty with the patient getting distracted never finishing things.  The patient describes difficulty sitting still in a car for long periods of time.  The patient reports that her mother would tell her that the only thing the patient would have remember was the last thing that was said.  The patient describes difficulties throughout school.  She reports that she has tried to return to take college courses on 4 separate occasions as an adult and would eventually behind and not be able to keep up in withdrawal.  The patient reports that it helps her if she right stuff down or on a board at home if and only if she remembers to go back and look at what she is written down later.  The patient does have a history of pain disorder in her back as well as possible gynecological and urological pain.  Current Status:  The patient describes ongoing difficulties with attention and concentration and remembering what she has been exposed to recently.  The patient reports that these difficulties are current at the present time and have been present in some shape or form since she was very young even into kindergarten.  The patient does acknowledge some nights with disturbed sleep and other nights she sleeps okay.  Patient reports her appetite is good.  The patient and her memory has significant deficits and that she forgets things all of the time.  She reports the effect of this is that her family thinks that she does not listen to them and that she has  a significant difficulty staying focused on what others are telling her.  Reliability of Information: The information is provided by 1 hour face-to-face clinical interview with the patient as well as review of available medical records.  Behavioral Observation: Kayleanna Lorman  presents as a 32 y.o.-year-old Right Caucasian Female who appeared her stated age. her dress was Appropriate and she was Well Groomed and her manners were Appropriate to the situation.  her  participation was indicative of Appropriate behaviors.  There were not any physical disabilities noted.  she displayed an appropriate level of cooperation and motivation.     Interactions:    Active Appropriate  Attention:   abnormal and attention span appeared shorter than expected for age  Memory:   within normal limits; recent and remote memory intact  Visuo-spatial:  not examined  Speech (Volume):  normal  Speech:   normal; normal  Thought Process:  Coherent and Relevant  Though Content:  WNL; not suicidal and not homicidal  Orientation:   person, place, time/date and situation  Judgment:   Fair  Planning:   Fair  Affect:    Appropriate  Mood:    Dysphoric  Insight:   Good  Intelligence:   normal  Marital Status/Living: The patient reports that she was born and raised in [place].  She has no siblings.  She weighed 6 pounds 5 ounces at birth.  Developmental milestones were reached at the appropriate time and there were no significant childhood illnesses.  The patient describes difficulties with reading through her school and had very significant difficulties in middle school.  The patient is not married.  The patient does have 3 children.  She currently lives with her 3 children.  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  Past Employment:  Childcare  Substance Use:  No concerns of substance abuse are reported.  Per there are no issues identified as far as substance abuse the patient does have history of chronic pain and has begun seeing a pain management program for both interventional pain management as well as possible medication pain management.  Education:   Engineering geologist History:   Past Medical History:  Diagnosis Date  . Family history of adverse reaction to anesthesia    dad-n/v  . History of kidney stones    h/o  . Vaginal Pap smear, abnormal 2012   REPEAT AND WAS NORMAL        Abuse/Trauma History: The patient does not report any history of abuse or trauma.  Psychiatric History:  The patient denies any significant prior psychiatric history.  Family Med/Psych History:  Family History  Problem Relation Age of Onset  . Breast cancer Paternal Aunt   . Diabetes Paternal Aunt   . Diabetes Maternal Grandfather   . COPD Maternal Grandmother   . Cancer Maternal Grandmother        lung  . Colon cancer Neg Hx   . Ovarian cancer Neg Hx   . Heart disease Neg Hx     Risk of Suicide/Violence: low the patient denies any suicidal or homicidal ideation.  Impression/DX:  Serenah Mill was referred for neurological evaluation to facilitate the differential diagnostic question.  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  The patient reports a lifelong difficulty with attention and concentration as well as impulsivity and hypokinesis it she reports these difficulties go back to kindergarten  and that her mother has some the patient that there have always been difficulty with the patient getting distracted never finishing things.  The patient describes difficulty sitting still in a car for long periods of time.  The patient reports that her mother would tell her that the only thing the patient would have remember was the last thing that was said.  The patient describes difficulties throughout school.  She reports that she has tried to return to take college courses on 4 separate occasions as an adult and would eventually behind and not be able to keep up in withdrawal.  The patient reports that it helps her if she right stuff down or on a board at home if and only if she remembers to go back and look at what she is written down later.  The patient does have a history of pain disorder in her back as well as possible gynecological and urological pain.  Current Status:  The patient describes ongoing  difficulties with attention and concentration and remembering what she has been exposed to recently.  The patient reports that these difficulties are current at the present time and have been present in some shape or form since she was very young even into kindergarten.  The patient does acknowledge some nights with disturbed sleep and other nights she sleeps okay.  Patient reports her appetite is good.  The patient and her memory has significant deficits and that she forgets things all of the time.  She reports the effect of this is that her family thinks that she does not listen to them and that she has a significant difficulty staying focused on what others are telling her.  Disposition/Plan:  We have set the patient up for formal neuropsychological testing.  This testing will be conducted on 08/19/2017 and will include the comprehensive attention battery as well as the CAB CPT  Diagnosis:    Cognitive retention disorder  Chronic pain syndrome         Electronically Signed   _______________________ Ilean Skill, Psy.D.

## 2017-08-19 ENCOUNTER — Encounter: Payer: Self-pay | Admitting: Psychology

## 2017-08-19 ENCOUNTER — Encounter: Payer: Medicaid Other | Attending: Psychology | Admitting: Psychology

## 2017-08-19 DIAGNOSIS — F988 Other specified behavioral and emotional disorders with onset usually occurring in childhood and adolescence: Secondary | ICD-10-CM | POA: Insufficient documentation

## 2017-08-19 DIAGNOSIS — R4189 Other symptoms and signs involving cognitive functions and awareness: Secondary | ICD-10-CM | POA: Diagnosis not present

## 2017-08-19 DIAGNOSIS — F09 Unspecified mental disorder due to known physiological condition: Secondary | ICD-10-CM | POA: Diagnosis not present

## 2017-08-19 DIAGNOSIS — Z87442 Personal history of urinary calculi: Secondary | ICD-10-CM | POA: Insufficient documentation

## 2017-08-19 DIAGNOSIS — G894 Chronic pain syndrome: Secondary | ICD-10-CM

## 2017-08-19 NOTE — Progress Notes (Signed)
Today, I administered the Comprehensive Attention Battery and the CAB CPT measures.  The patient appeared to provide full effort and this appears to be a fair and valid assessment.  The measures will be scored, interpreted and formal report written on 08/26/2017 with feedback to the patient on 8/16.

## 2017-08-26 ENCOUNTER — Encounter (HOSPITAL_BASED_OUTPATIENT_CLINIC_OR_DEPARTMENT_OTHER): Payer: Medicaid Other | Admitting: Psychology

## 2017-08-26 DIAGNOSIS — F988 Other specified behavioral and emotional disorders with onset usually occurring in childhood and adolescence: Secondary | ICD-10-CM | POA: Diagnosis not present

## 2017-08-26 DIAGNOSIS — F09 Unspecified mental disorder due to known physiological condition: Secondary | ICD-10-CM | POA: Diagnosis not present

## 2017-08-26 DIAGNOSIS — F908 Attention-deficit hyperactivity disorder, other type: Secondary | ICD-10-CM | POA: Diagnosis not present

## 2017-08-26 DIAGNOSIS — Z87442 Personal history of urinary calculi: Secondary | ICD-10-CM | POA: Diagnosis not present

## 2017-08-26 DIAGNOSIS — G894 Chronic pain syndrome: Secondary | ICD-10-CM | POA: Diagnosis not present

## 2017-08-26 NOTE — Progress Notes (Addendum)
Patient:  Cassandra Duarte   DOB: April 26, 1985  MR Number: [id number]  Location: [facility] PHYSICAL MEDICINE AND REHABILITATION [place], [id number] [place] [address] Dept: [phone]  Start: 11 AM End: 12 PM  Provider/Observer:     Edgardo Roys PsyD  Chief Complaint:      Chief Complaint  Patient presents with  . ADHD  . Memory Loss  . Pain    Reason For Service:     Cassandra Duarte is a 32 year old female who was referred for a neuropsychological evaluation to assess possible issues related to adult residual attention deficit disorder and address difficulties with learning and retention.  The patient reports that she has been having trouble staying focused on schoolwork or any other particular task.  She also has difficulty sitting still for long periods of time.  The patient reports that she has tried to go back to school on several occasions and ends up falling behind and not being able to keep up with class work.  The patient describes herself as being impulsive and hyper even in kindergarten and had trouble in high school for fighting and arguing.  The patient denies any anxiety or depression.  The patient also has a history of chronic pain syndrome diagnoses as well as abnormalities found on imaging of her back.  She has seen pain specialist issues.  Testing Administered:  The patient was administered the comprehensive attention battery and the CAB CPT measure.  This assessment was completed on 08/19/2017 for test administration.  Participation Level:   Active  Participation Quality:  Appropriate      Behavioral Observation:  Well Groomed, Alert, and Appropriate.   Summary of Results:   Behavioral observations during the test administration do suggest that the patient provided full effort throughout the testing procedures and this does appear to be a fair and valid assessment of her  current attentional functioning as well as executive functioning.  The patient was administered the Comprehensive Attention Battery and the CAB CPT measures. The patient appeared to fully participate in these testing procedures and this does appear to be a fair and valid sample of her current attentional abilities as well as various aspects of executive functioning. Below are the results of this broad and comprehensive assessment of attention/concentration and executive functioning.  Initially, the patient was administered the auditory/visual reaction time test. These two measures are both pure reaction time measures and are administered in both the visual and auditory modalities. On the visual pure reaction time test, the patient accurately responded to 50 of the 50 targets, which is within normal limits. her average response time was 437 ms which is within normal limits. The patient was administered the auditory pure reaction time test and she correctly responded to 50 of 50 targets, which is an efficient performance and within normal limits. her average response time was 452 ms, which is also within normal limits.  The patient was then administered the discriminant reaction time test. she was administered the visual, auditory, and mixed subtests. On the visual discriminate reaction time measure, she correctly responded to 35 of 35 targets and had 0 errors of commission and 0 errors of omission. This is an efficient performance and represents a performance that is within normative expectations. her average response time for correctly responded to items was 689 ms which is 1 standard deviation below normative expectations.. The patient was then administered the auditory discriminate reaction  time measure. she correctly responded to 34 of 35 targets, which is efficient for accuracy and within normal limits. her average response time was 819 ms, which is mildly impaired and just outside of normative  expectations. The patient was then administered the mixed discriminate reaction time, which require shifting from between either auditory or visual targets with an alteration between auditory and visual stimuli. This measure require shifting attention on top of discriminate identification and responding.  The patient correctly responded to 27 of the 30 targets and had one errors of commission and 3 errors of omission. This is a generally efficient score for accuracy.  her average response time for correct responses was 987 Ms.  This performance is mildly impaired and just outside of normal limits and represents issues with overall information processing speed.  The patient was administered the auditory/visual scan reaction time test. On the visual measure the patient correctly responded to 40 of 40 targets and the average response time was within normal limits. The auditory measure resulted in the correct response to 40 of 40 targets with 0 errors of commission and 0 error of omission. her average response times at the slower end of the average range. The patient was then administered the mixed auditory visual scan measure and she correctly responded to 38 of 40 targets, which is within normal limits and her response times were within normal limits.  The patient was then administered the auditory/visual encoding test. On the auditory forwards the patient's performance was mild to moderate impairments and statistically below normative expectations.  On the auditory backwards measures the patient's performance was again mildly impaired relative to normative expectations.  This pattern suggests mild to moderate deficits with regard to auditory encoding. On the visual encoding forward measure the patient produced performance that was within normal limits.  On the visual backwards measures the patient's performance was within normal limits.  Overall, this pattern suggests that auditory encoding is mild to moderately  impaired and indicative of auditory encoding issues where he has visual encoding was quite good and at the upper end of the average range.  The patient was then administered the Stroop interference cancellation test. This task is broken down into eight separate trials. On the first four trials the patient is presented with a focus execute task that requires the patient to scan a 36 grid layout in which the words red green or blue were randomly printed in each grid. Each of these color words and be printed in either red green or blue color. On half of them, the word matches the color of the font and it is these that the patient is to identify where the color and word match. After the first four trials of this visual scanning measure change to four trials that include a Stroop interference component inwhich the words red green and blue are played randomly over the speakers. On the first four "noninterference" trials the patient produced performances on these focus execute task that were within normal limits. she correctly identified between 11 and 17 items on each of these trials. On the next four interference trials, the patient's performance showed significant deterioration in her performance relative to her own excellent focus execute abilities on the first for non-interference trials.  The patient correctly identified 16 of the 18 targets on the fourth series of non-interference trials.  However, when the targeted interference was introduced performance dropped by approximately 25% which is significantly below what is expected.  The patient's performance did not  return back to her level even with repeated trials with this it targeted interference.  This pattern does strongly suggest difficulties with distractibility and difficulty blocking out external unimportant stimuli.  The patient was then administered the CAB CPT visual monitor measure, which is a 15 minute long visual continuous performance measure.   This measure is broken down into five 3-minute blocks of time for analysis. The patient is presented with either the color red green or blue every 2 seconds and every time the color red is presented the patient is to respond. On the first 3 min. Block of time the patient correctly identified 30 of 30 targets with 1 error of commission and 0 errors of omission. her average response time was 436 Ms. This performance showed significant deterioration as a function of time.  While she was able to "pull it together as far as reaction times" at the very last 3 minutes of this task the patient showed periods where there was significant slowing and information processing speed.  The patient also went from having only one error of commission and 0 errors of omission during the first 3 minutes of the task in the 9 to 11-minute mark she had 6 errors of omission and 5 errors of omission in the 6 to 9-minute mark.  Her accuracy scores did improve somewhat in the last 3 minutes of the task like her overall response time did but there was clear indication of deterioration in performance as a function of time.  This pattern is consistent with deficits with regard to sustaining attention.  Overall:  The patient's performance on this broad range of attention/concentration measures and executive functioning measures are consistent with specific attentional deficits but not significant executive functioning deficits beyond attention and concentration.  The patient did quite well on pure reaction time measures and short duration focus execute task.  She was able to inhibit impulsive and inappropriate responses.  However, the patient showed deficits with regard to overall speed of information processing, auditory encoding deficits and difficulties remaining free from external distractibility.  The patient also showed significant deficits with regard to sustained attention.  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  The patient describes a childhood with examples and difficulties of attention and concentration going back to the start of school.  The patient describes herself as being impulsive and having difficulty staying on task throughout her life.  The current neuropsychological deficits measured showed significant deficits with regard to sustained attention and concentration, freedom from external distractibility, and difficulties with overall speed of mental operations when decisions are being made.  The patient also showed deficits on auditory encoding that were not seen with visual encoding.  If this was the only deficits identified the symptoms would primarily be seen as a central auditory processing deficit.  However, we also see difficulties with sustaining attention on purely visual tasks.  While the patient does have issues with chronic pain and its residual effect on mood and sleep, the current deficits are not consistent with that being the sole explanation of her subjective and objective features of attentional deficits.  While there can be concerns about various medications to treat both of her pain issues as well as her attentional issues I do think that the patient's objective information is consistent with adult residual attention deficit disorder.  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 Neuropsychologist

## 2017-09-02 ENCOUNTER — Other Ambulatory Visit: Payer: Self-pay | Admitting: *Deleted

## 2017-09-02 ENCOUNTER — Telehealth: Payer: Self-pay | Admitting: Obstetrics and Gynecology

## 2017-09-02 ENCOUNTER — Encounter: Payer: Medicaid Other | Admitting: Psychology

## 2017-09-02 MED ORDER — FLUCONAZOLE 150 MG PO TABS: 150.0000 mg | ORAL_TABLET | Freq: Once | ORAL | 2 refills | Status: AC

## 2017-09-02 NOTE — Telephone Encounter (Signed)
The patient called stating that she is having yeast infection symptoms and still having "bad bleeding" for 3 months now.  She has an upcoming appt on 10/13/17 with Dr. Tennis Must, but since [place] and [place] are out today, she is asking if Melody can send her in something today for the infection b/c she cannot wait until Monday when De or Crystal are back in the office.  Pharmacy is the same.  Good call back number is [phone], please advise, thanks.

## 2017-09-07 NOTE — Telephone Encounter (Signed)
I just saw this message, can you contact her and see is=f she is still having symptoms and needs something called in?  Melody

## 2017-09-28 NOTE — Telephone Encounter (Signed)
Opened in error

## 2017-09-29 DIAGNOSIS — S9032XS Contusion of left foot, sequela: Secondary | ICD-10-CM | POA: Diagnosis not present

## 2017-10-04 ENCOUNTER — Ambulatory Visit: Payer: Medicaid Other | Admitting: Psychology

## 2017-10-04 ENCOUNTER — Encounter

## 2017-10-06 ENCOUNTER — Encounter: Payer: Self-pay | Admitting: Emergency Medicine

## 2017-10-06 ENCOUNTER — Other Ambulatory Visit: Payer: Self-pay

## 2017-10-06 ENCOUNTER — Ambulatory Visit
Admission: EM | Admit: 2017-10-06 | Discharge: 2017-10-06 | Disposition: A | Discharge status: Home / Self Care | Payer: Medicaid Other | Attending: Emergency Medicine | Admitting: Emergency Medicine

## 2017-10-06 DIAGNOSIS — N760 Acute vaginitis: Secondary | ICD-10-CM | POA: Insufficient documentation

## 2017-10-06 DIAGNOSIS — B9689 Other specified bacterial agents as the cause of diseases classified elsewhere: Secondary | ICD-10-CM

## 2017-10-06 DIAGNOSIS — G894 Chronic pain syndrome: Secondary | ICD-10-CM | POA: Diagnosis not present

## 2017-10-06 DIAGNOSIS — Z87891 Personal history of nicotine dependence: Secondary | ICD-10-CM | POA: Diagnosis not present

## 2017-10-06 DIAGNOSIS — Z888 Allergy status to other drugs, medicaments and biological substances status: Secondary | ICD-10-CM | POA: Insufficient documentation

## 2017-10-06 DIAGNOSIS — Z87442 Personal history of urinary calculi: Secondary | ICD-10-CM | POA: Diagnosis not present

## 2017-10-06 DIAGNOSIS — M545 Low back pain, unspecified: Secondary | ICD-10-CM

## 2017-10-06 DIAGNOSIS — R350 Frequency of micturition: Secondary | ICD-10-CM

## 2017-10-06 LAB — URINALYSIS, COMPLETE (UACMP) WITH MICROSCOPIC
Bacteria, UA: NONE SEEN
Bilirubin Urine: NEGATIVE
GLUCOSE, UA: NEGATIVE mg/dL
HGB URINE DIPSTICK: NEGATIVE
KETONES UR: NEGATIVE mg/dL
LEUKOCYTES UA: NEGATIVE
Nitrite: NEGATIVE
Protein, ur: NEGATIVE mg/dL
SPECIFIC GRAVITY, URINE: 1.015 (ref 1.005–1.030)
pH: 6 (ref 5.0–8.0)

## 2017-10-06 LAB — WET PREP, GENITAL
Sperm: NONE SEEN
Trich, Wet Prep: NONE SEEN
WBC, Wet Prep HPF POC: NEGATIVE — AB
Yeast Wet Prep HPF POC: NONE SEEN

## 2017-10-06 LAB — PREGNANCY, URINE: Preg Test, Ur: NEGATIVE

## 2017-10-06 MED ORDER — TIZANIDINE HCL 4 MG PO CAPS: 4.0000 mg | ORAL_CAPSULE | Freq: Three times a day (TID) | ORAL | 0 refills | Status: DC

## 2017-10-06 MED ORDER — NAPROXEN 500 MG PO TABS: 500.0000 mg | ORAL_TABLET | Freq: Two times a day (BID) | ORAL | 0 refills | Status: DC

## 2017-10-06 MED ORDER — METRONIDAZOLE 500 MG PO TABS: 500.0000 mg | ORAL_TABLET | Freq: Two times a day (BID) | ORAL | 0 refills | Status: DC

## 2017-10-06 MED ORDER — TAMSULOSIN HCL 0.4 MG PO CAPS: 0.4000 mg | ORAL_CAPSULE | Freq: Every morning | ORAL | 0 refills | Status: DC

## 2017-10-06 NOTE — ED Provider Notes (Signed)
[facility]    CSN: [id number] Arrival date & time: 10/06/17  1004     History   Chief Complaint Chief Complaint  Patient presents with  . Urinary Frequency    HPI Cassandra Duarte is a 32 y.o. female.   HPI  -year-old female presents with a week history of urinary frequency and urgency.  She does not have dysuria.  Is concerned because she has a history of kidney stones she  had on 2 occasions remotely.  She states on both occasions she has passed the stone spontaneously.  The first episode she did have hematuria the second time did not have hematuria until was almost time to pass the stone.  Denies any fever or chills.  Pain level at today's visit is 6 out of 10.  He said that last time that the stone passed  she was given Flomax.  She recently sustained an injury to her foot and has not been working for the last 2weeks.  She states that whenever she is not active , she seems to develop stones.  Denies any vaginal discharge; is not concerned for STDs today.  Her major complaint today above the urinary symptoms is that of right-sided back pain radiating to the flank area.  She had no recent injury but has had a change in her ambulatory mechanics with the foot injury that she has sustained.       Past Medical History:  Diagnosis Date  . Family history of adverse reaction to anesthesia    dad-n/v  . History of kidney stones    h/o  . Vaginal Pap smear, abnormal 2012   REPEAT AND WAS NORMAL    Patient Active Problem List   Diagnosis Date Noted  . Status post laparoscopy with adhesio lysis 07/13/2017  . Chronic pain syndrome 06/29/2017  . 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 Myofascial pain 06/29/2017  . Concentration deficit 05/26/2017  . Menorrhagia with regular cycle 05/03/2017  . Dysmenorrhea 05/03/2017  . Overweight 10/01/2015  . Lactational amenorrhea 10/01/2015  . Family history of breast cancer 10/01/2015  . Chronic low back pain with  right-sided sciatica 10/09/2014    Past Surgical History:  Procedure Laterality Date  . DILATION AND CURETTAGE OF UTERUS  2014   WS  . LAPAROSCOPIC LYSIS OF ADHESIONS  06/20/2017   Procedure: LAPAROSCOPIC LYSIS OF ADHESIONS;  Surgeon: Brayton Mars, MD;  Location: [facility];  Service: Gynecology;;  . LAPAROSCOPY N/A 06/20/2017   Procedure: OPERATIVE DIAGNOSTIC WITH BIOPSIES;  Surgeon: Brayton Mars, MD;  Location: [facility] OR[facility];  Service: Gynecology;  Laterality: N/A;  . TUBAL LIGATION Bilateral 02/21/2015   Procedure: BILATERAL TUBAL LIGATION;  Surgeon: Brayton Mars, MD;  Location: [facility];  Service: Gynecology;  Laterality: Bilateral;    OB History    Gravida  8   Para  3   Term  3   Preterm      AB  5   Living  3     SAB  5   TAB      Ectopic      Multiple  0   Live Births  3        Obstetric Comments  H/O PTL both pregnanacies starting around 28 weeks requiring bedrest and pelvic rest - did carry to 37 and 38 weeks respectfully         Home Medications    Prior to Admission medications   Medication Sig Start Date  End Date Taking? Authorizing Provider  metroNIDAZOLE (FLAGYL) 500 MG tablet Take 1 tablet (500 mg total) by mouth 2 (two) times daily. 10/06/17   Lorin Picket, PA-C  naproxen (NAPROSYN) 500 MG tablet Take 1 tablet (500 mg total) by mouth 2 (two) times daily with a meal. 10/06/17   Lorin Picket, PA-C  tamsulosin (FLOMAX) 0.4 MG CAPS capsule Take 1 capsule (0.4 mg total) by mouth every morning. 10/06/17   Lorin Picket, PA-C  tiZANidine (ZANAFLEX) 4 MG capsule Take 1 capsule (4 mg total) by mouth 3 (three) times daily. 10/06/17   Lorin Picket, PA-C    Family History Family History  Problem Relation Age of Onset  . Breast cancer Paternal Aunt   . Diabetes Paternal Aunt   . Diabetes Maternal Grandfather   . COPD Maternal Grandmother   . Cancer Maternal Grandmother        lung  . Colon cancer Neg Hx   . Ovarian  cancer Neg Hx   . Heart disease Neg Hx     Social History Social History   Tobacco Use  . Smoking status: Former Smoker    Packs/day: 0.50    Years: 4.00    Pack years: 2.00    Types: Cigarettes    Last attempt to quit: 04/02/2014    Years since quitting: 3.5  . Smokeless tobacco: Never Used  Substance Use Topics  . Alcohol use: No    Alcohol/week: 0.0 standard drinks  . Drug use: No     Allergies   Magnesium-containing compounds; Toradol [ketorolac tromethamine]; Tamiflu [oseltamivir phosphate]; and Tramadol   Review of Systems Review of Systems  Constitutional: Positive for activity change. Negative for appetite change, chills, fatigue and fever.  Genitourinary: Positive for flank pain, frequency and urgency. Negative for hematuria and vaginal discharge.  All other systems reviewed and are negative.    Physical Exam Triage Vital Signs ED Triage Vitals  Enc Vitals Group     BP 10/06/17 1044 116/75     Pulse Rate 10/06/17 1044 75     Resp 10/06/17 1044 18     Temp 10/06/17 1044 98.2 F (36.8 C)     Temp Source 10/06/17 1044 Oral     SpO2 10/06/17 1044 100 %     Weight 10/06/17 1045 146 lb (66.2 kg)     Height 10/06/17 1045 5\' 3"  (1.6 m)     Head Circumference --      Peak Flow --      Pain Score 10/06/17 1043 6     Pain Loc --      Pain Edu? --      Excl. in [place]? --    No data found.  Updated Vital Signs BP 116/75 (BP Location: Left Arm)   Pulse 75   Temp 98.2 F (36.8 C) (Oral)   Resp 18   Ht 5\' 3"  (1.6 m)   Wt 146 lb (66.2 kg)   LMP 09/15/2017   SpO2 100%   Breastfeeding? No   BMI 25.86 kg/m   Visual Acuity Right Eye Distance:   Left Eye Distance:   Bilateral Distance:    Right Eye Near:   Left Eye Near:    Bilateral Near:     Physical Exam  Constitutional: She is oriented to person, place, and time. She appears well-developed and well-nourished. No distress.  HENT:  Head: Normocephalic.  Eyes: Pupils are equal, round, and reactive  to light. Right eye exhibits no  discharge. Left eye exhibits no discharge.  Neck: Normal range of motion.  Pulmonary/Chest: Effort normal and breath sounds normal.  Musculoskeletal: Normal range of motion. She exhibits tenderness.  Examination of the lumbar spine shows tenderness of the paraspinous muscles from just inferior to the costophrenic angle into the lower lumbar segments.  This extends laterally into the flank.  She does not have any tenderness of the abdomen in this area. Able To forward flex  to the level of her ankles with her hands.  Returning to upright posture is  somewhat difficult and painful in the right paraspinous muscles.  She does have right CVA tenderness to percussion.  Neurological: She is alert and oriented to person, place, and time.  Skin: Skin is warm and dry. She is not diaphoretic.  Psychiatric: She has a normal mood and affect. Her behavior is normal. Judgment and thought content normal.  Nursing note and vitals reviewed.    UC Treatments / Results  Labs (all labs ordered are listed, but only abnormal results are displayed) Labs Reviewed  WET PREP, GENITAL - Abnormal; Notable for the following components:      Result Value   Clue Cells Wet Prep HPF POC PRESENT (*)    WBC, Wet Prep HPF POC NEGATIVE (*)    All other components within normal limits  URINALYSIS, COMPLETE (UACMP) WITH MICROSCOPIC  PREGNANCY, URINE    EKG None  Radiology No results found.  Procedures Procedures (including critical care time)  Medications Ordered in UC Medications - No data to display  Initial Impression / Assessment and Plan / UC Course  I have reviewed the triage vital signs and the nursing notes.  Pertinent labs & imaging results that were available during my care of the patient were reviewed by me and considered in my medical decision making (see chart for details).   Discussed the findings with the patient.  The self swab showed that she does have bacterial  vaginosis infection.  Opted for oral Flagyl.  Low back pain I think that is most likely that she has acute lumbar sprain from the change in her ambulation mechanics.  From her past history of kidney stones could be an evolving kidney stone.  In either case she should protect the area from further injury.  I recommended that she screen all of her urine to see if she captures a stone.  Also provide her with Flomax to facilitate passing the stone if there is one present.  Pain I have recommended Naprosyn twice daily with food.  Allergic to Toradol but has taken ibuprofen in the past without difficulty.  Also provide her with Zanaflex for muscle relaxation.  She continues to have pain she can follow-up with her primary care physician or return to our clinic.   Final Clinical Impressions(s) / UC Diagnoses   Final diagnoses:  BV (bacterial vaginosis)  Acute right-sided low back pain without sciatica     Discharge Instructions     Apply ice 20 minutes out of every 2 hours 4-5 times daily for comfort. Use  Caution while taking muscle relaxers.  Do not perform activities requiring concentration or judgment and do not drive.     ED Prescriptions    Medication Sig Dispense Auth. Provider   tamsulosin (FLOMAX) 0.4 MG CAPS capsule Take 1 capsule (0.4 mg total) by mouth every morning. 15 capsule Crecencio Mc P, PA-C   tiZANidine (ZANAFLEX) 4 MG capsule Take 1 capsule (4 mg total) by mouth 3 (three) times  daily. 21 capsule Crecencio Mc P, PA-C   metroNIDAZOLE (FLAGYL) 500 MG tablet Take 1 tablet (500 mg total) by mouth 2 (two) times daily. 14 tablet Crecencio Mc P, PA-C   naproxen (NAPROSYN) 500 MG tablet Take 1 tablet (500 mg total) by mouth 2 (two) times daily with a meal. 60 tablet Lorin Picket, PA-C     Controlled Substance Prescriptions [place] Controlled Substance Registry consulted? Not Applicable   Lorin Picket, PA-C 10/06/17 1325

## 2017-10-06 NOTE — Discharge Instructions (Signed)
Apply ice 20 minutes out of every 2 hours 4-5 times daily for comfort. Use  Caution while taking muscle relaxers.  Do not perform activities requiring concentration or judgment and do not drive.  °

## 2017-10-06 NOTE — ED Triage Notes (Signed)
Patient c/o urinary frequency and urgency x 1 week.  

## 2017-10-07 ENCOUNTER — Encounter: Payer: Self-pay | Admitting: Family Medicine

## 2017-10-07 ENCOUNTER — Ambulatory Visit: Payer: Medicaid Other | Admitting: Family Medicine

## 2017-10-07 ENCOUNTER — Telehealth: Payer: Self-pay | Admitting: Family Medicine

## 2017-10-07 ENCOUNTER — Encounter: Payer: Self-pay | Admitting: Psychology

## 2017-10-07 ENCOUNTER — Encounter: Payer: Medicaid Other | Attending: Psychology | Admitting: Psychology

## 2017-10-07 ENCOUNTER — Other Ambulatory Visit: Payer: Self-pay

## 2017-10-07 VITALS — BP 112/75 | HR 99 | Temp 98.1°F | Ht 63.0 in | Wt 150.0 lb

## 2017-10-07 DIAGNOSIS — F908 Attention-deficit hyperactivity disorder, other type: Secondary | ICD-10-CM

## 2017-10-07 DIAGNOSIS — Z87442 Personal history of urinary calculi: Secondary | ICD-10-CM | POA: Diagnosis not present

## 2017-10-07 DIAGNOSIS — Z79899 Other long term (current) drug therapy: Secondary | ICD-10-CM

## 2017-10-07 DIAGNOSIS — F988 Other specified behavioral and emotional disorders with onset usually occurring in childhood and adolescence: Secondary | ICD-10-CM | POA: Insufficient documentation

## 2017-10-07 DIAGNOSIS — M9901 Segmental and somatic dysfunction of cervical region: Secondary | ICD-10-CM | POA: Diagnosis not present

## 2017-10-07 DIAGNOSIS — F09 Unspecified mental disorder due to known physiological condition: Secondary | ICD-10-CM | POA: Diagnosis not present

## 2017-10-07 DIAGNOSIS — B351 Tinea unguium: Secondary | ICD-10-CM

## 2017-10-07 DIAGNOSIS — G894 Chronic pain syndrome: Secondary | ICD-10-CM | POA: Insufficient documentation

## 2017-10-07 DIAGNOSIS — R4189 Other symptoms and signs involving cognitive functions and awareness: Secondary | ICD-10-CM

## 2017-10-07 DIAGNOSIS — R300 Dysuria: Secondary | ICD-10-CM

## 2017-10-07 DIAGNOSIS — M62830 Muscle spasm of back: Secondary | ICD-10-CM | POA: Diagnosis not present

## 2017-10-07 DIAGNOSIS — M5431 Sciatica, right side: Secondary | ICD-10-CM | POA: Diagnosis not present

## 2017-10-07 DIAGNOSIS — R2242 Localized swelling, mass and lump, left lower limb: Secondary | ICD-10-CM

## 2017-10-07 DIAGNOSIS — M9903 Segmental and somatic dysfunction of lumbar region: Secondary | ICD-10-CM | POA: Diagnosis not present

## 2017-10-07 MED ORDER — AMPHETAMINE-DEXTROAMPHETAMINE 10 MG PO TABS: 10.0000 mg | ORAL_TABLET | Freq: Every day | ORAL | 0 refills | Status: DC

## 2017-10-07 NOTE — Progress Notes (Signed)
BP 112/75   Pulse 99   Temp 98.1 F (36.7 C) (Oral)   Ht 5\' 3"  (1.6 m)   Wt 150 lb (68 kg)   LMP 09/15/2017   SpO2 98%   BMI 26.57 kg/m    Subjective:    Patient ID: Cassandra Duarte, female    DOB: May 22, 1985, 32 y.o.   MRN: [id number]  HPI: Cassandra Duarte is a 32 y.o. female  Chief Complaint  Patient presents with  . Foot Pain    knot in left foot, pt states she might have fungus in her left middle finger, pt would like to discuss psychiatry test results   Here today with several concerns.   Recent left foot fracture at 3rd and 4th toes. Still having some bruising and swelling, but otherwise able to ambulate well. Has a few pain pills left from this incident that she's been taking at night to help with sleep. Now having pain from a mass on lateral foot that just popped up in the past week or two, wanting to see Podiatry for this.   Just had a consultation with a Psychologist who diagnosed her with ADD and recommended she start stimulant therapy. Has had these issues throughout her life but currently working, going to school, and caring for her children and has noticed things getting worse with her focus and ability to complete tasks. Has not tried therapy for this in the past.   Relevant past medical, surgical, family and social history reviewed and updated as indicated. Interim medical history since our last visit reviewed. Allergies and medications reviewed and updated.  Review of Systems  Per HPI unless specifically indicated above     Objective:    BP 112/75   Pulse 99   Temp 98.1 F (36.7 C) (Oral)   Ht 5\' 3"  (1.6 m)   Wt 150 lb (68 kg)   LMP 09/15/2017   SpO2 98%   BMI 26.57 kg/m   Wt Readings from Last 3 Encounters:  10/07/17 150 lb (68 kg)  10/06/17 146 lb (66.2 kg)  07/29/17 154 lb 1.6 oz (69.9 kg)    Physical Exam  Constitutional: She is oriented to person, place, and time. She appears well-developed and well-nourished. No distress.    HENT:  Head: Atraumatic.  Eyes: Conjunctivae and EOM are normal.  Neck: Normal range of motion. Neck supple.  Cardiovascular: Normal rate and regular rhythm.  Pulmonary/Chest: Effort normal and breath sounds normal.  Musculoskeletal: Normal range of motion. She exhibits edema (and mild bruising left foot 3rd and 4th digits) and tenderness (at area of bruising as well as over small smoth circular mass lateral left foot).  Neurological: She is alert and oriented to person, place, and time.  Skin: Skin is warm and dry.  Several fingernails b/l with thickened, lifting nails  Psychiatric: She has a normal mood and affect. Her behavior is normal.  Nursing note and vitals reviewed.   Results for orders placed or performed in visit on 10/07/17  Urine drugs of abuse scrn w alc, routine (Ref Lab)  Result Value Ref Range   Amphetamines, Urine WILL FOLLOW    Barbiturate Quant, Ur WILL FOLLOW    Benzodiazepine Quant, Ur WILL FOLLOW    Cannabinoid Quant, Ur WILL FOLLOW    Cocaine (Metab.) WILL FOLLOW    Opiate Quant, Ur WILL FOLLOW    OPIATES WILL FOLLOW    CODEINE WILL FOLLOW    Codeine GC/MS Conf WILL FOLLOW  MORPHINE WILL FOLLOW    Morphine GC/MS Conf WILL FOLLOW    PCP Quant, Ur WILL FOLLOW    Methadone Screen, Urine WILL FOLLOW    Propoxyphene WILL FOLLOW    Ethanol, Urine WILL FOLLOW       Assessment & Plan:   Problem List Items Addressed This Visit      Other   ADD (attention deficit disorder)    Discussed multiple options, pt wanting to start adderall short release. Controlled substance agreement signed, UDS pending. Follow up in 1 month. Risks and benefits reviewed      Dysuria - Primary    Being treated for UTI, but wants GC Chlamydia screen. Await results      Relevant Orders   GC/Chlamydia Probe Amp    Other Visit Diagnoses    Onychomycosis       OTC lamisil daily x 6 months, recheck at that time   Controlled substance agreement signed       Relevant Orders    Urine drugs of abuse scrn w alc, routine (Ref Lab) (Completed)   Foot mass, left       Referral to podiatry placed. Continue rest, epsom salt soaks, OTC Pain relievers prn.    Relevant Orders   Ambulatory referral to Podiatry       Follow up plan: Return in about 4 weeks (around 11/04/2017) for ADHD.

## 2017-10-07 NOTE — Progress Notes (Signed)
Today I provided feedback regarding the neuropsychological evaluation it was recently completed.  The formal report can be found on 08/26/2017 note.  Cassandra Duarte is a 32 year old female who was referred for neuropsychological evaluation to assess possible issues related to adult residual attention disorder and address difficulties with learning and retention.  The patient reports that she has had trouble staying focused on schoolwork or any other practical task.  The patient also has difficulty sitting still for long periods of time.  The patient has been trying to go back to school on several occasions but ends up falling behind and not being able to keep up with the class work.  The patient does describe herself as being impulsive and hyper even in kindergarten and had trouble in school for fighting and arguing.  The patient denies anxiety and depression.  The results of the current neuropsychological evaluation are consistent with adult residual attention deficit disorder.  Below you will find the summary and recommendations and her formal report.  I do think that the patient would likely respond best to psychostimulant class medications and has an adult may be able to go with just a generic methylphenidate as a trial if appropriate for other medical considerations.    Impression/Diagnosis:                     The results of the current objective neuropsychological evaluation are consistent with features of attention deficit adult residual type disorder.  The patient describes a childhood with examples and difficulties of attention and concentration going back to the start of school.  The patient describes herself as being impulsive and having difficulty staying on task throughout her life.  The current neuropsychological deficits measured showed significant deficits with regard to sustained attention and concentration, freedom from external distractibility, and difficulties with overall speed of mental  operations when decisions are being made.  The patient also showed deficits on auditory encoding that were not seen with visual encoding.  If this was the only deficits identified the symptoms would primarily be seen as a central auditory processing deficit.  However, we also see difficulties with sustaining attention on purely visual tasks.  While the patient does have issues with chronic pain and its residual effect on mood and sleep, the current deficits are not consistent with that being the sole explanation of her subjective and objective features of attentional deficits.  While there can be concerns about various medications to treat both of her pain issues as well as her attentional issues I do think that the patient's objective information is consistent with adult residual attention deficit disorder.  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 Neuropsychologist

## 2017-10-07 NOTE — Telephone Encounter (Signed)
Patient states to put in the referral to podiatry for her foot pain.  Thank you

## 2017-10-09 ENCOUNTER — Encounter: Payer: Self-pay | Admitting: Family Medicine

## 2017-10-10 ENCOUNTER — Telehealth: Payer: Self-pay | Admitting: Family Medicine

## 2017-10-10 DIAGNOSIS — R300 Dysuria: Secondary | ICD-10-CM | POA: Insufficient documentation

## 2017-10-10 NOTE — Assessment & Plan Note (Signed)
Being treated for UTI, but wants GC Chlamydia screen. Await results

## 2017-10-10 NOTE — Telephone Encounter (Signed)
Referral placed.

## 2017-10-10 NOTE — Telephone Encounter (Signed)
Copied from [place] [phone]. Topic: General - Other >> Oct 10, 2017  1:30 PM Judyann Munson wrote: Reason for NBV:APOLIDC is calling to request a call back in regards, her new medication  Adderall she started on Friday. She has left a my chart message, and she is wondering if maybe the dosage should be up. Patient is requesting a call back. Please advise

## 2017-10-10 NOTE — Telephone Encounter (Signed)
Replied via mychart a few min ago  "I would give it some more time as it's only been a few days, but we may need to try you on the extended release for a better effect if this one is only lasting 30 minutes because increasing the dose is not going to make it last longer. "

## 2017-10-10 NOTE — Assessment & Plan Note (Signed)
Discussed multiple options, pt wanting to start adderall short release. Controlled substance agreement signed, UDS pending. Follow up in 1 month. Risks and benefits reviewed

## 2017-10-10 NOTE — Patient Instructions (Signed)
Follow up in 1 month   

## 2017-10-11 LAB — GC/CHLAMYDIA PROBE AMP
Chlamydia trachomatis, NAA: NEGATIVE
Neisseria gonorrhoeae by PCR: NEGATIVE

## 2017-10-11 NOTE — Telephone Encounter (Signed)
Please review Fridays labs and review. GC is back. UDS is still out.

## 2017-10-12 ENCOUNTER — Telehealth: Payer: Self-pay | Admitting: Emergency Medicine

## 2017-10-12 ENCOUNTER — Telehealth: Payer: Self-pay

## 2017-10-12 LAB — URINE DRUGS OF ABUSE SCREEN W ALC, ROUTINE (REF LAB)
Amphetamines, Urine: NEGATIVE ng/mL
BARBITURATE QUANT UR: NEGATIVE ng/mL
Benzodiazepine Quant, Ur: NEGATIVE ng/mL
CANNABINOID QUANT UR: NEGATIVE ng/mL
COCAINE (METAB.): NEGATIVE ng/mL
Ethanol, Urine: NEGATIVE %
Methadone Screen, Urine: NEGATIVE ng/mL
PCP Quant, Ur: NEGATIVE ng/mL
Propoxyphene: NEGATIVE ng/mL

## 2017-10-12 LAB — OPIATES CONFIRMATION, URINE: OPIATES: NEGATIVE

## 2017-10-12 MED ORDER — FLUCONAZOLE 150 MG PO TABS: 150.0000 mg | ORAL_TABLET | Freq: Every day | ORAL | 0 refills | Status: DC

## 2017-10-12 MED ORDER — CLINDAMYCIN HCL 300 MG PO CAPS: 300.0000 mg | ORAL_CAPSULE | Freq: Two times a day (BID) | ORAL | 0 refills | Status: DC

## 2017-10-12 NOTE — Telephone Encounter (Signed)
Copied from [place] [phone]. Topic: Quick Communication - Other Results >> Oct 11, 2017  2:06 PM Lennox Solders wrote: Pt would like her urine results   Routing to provider for results. Do not see where they were reviewed yet.

## 2017-10-12 NOTE — Telephone Encounter (Signed)
Responded via mychart

## 2017-10-12 NOTE — Telephone Encounter (Signed)
Pt called stating that  Flagyl was making her have abdominal pain and giving her diarrhea. Per Dr. Zenda Alpers. Change to Clindamycin 300 mg 1 Po BID x 7 days.

## 2017-10-12 NOTE — Addendum Note (Signed)
Addended by: Ermalinda Barrios on: 10/12/2017 04:52 PM   Modules accepted: Orders

## 2017-10-13 ENCOUNTER — Encounter: Payer: Medicaid Other | Admitting: Obstetrics and Gynecology

## 2017-10-20 ENCOUNTER — Ambulatory Visit: Payer: Medicaid Other | Admitting: Psychology

## 2017-10-31 ENCOUNTER — Ambulatory Visit: Payer: Medicaid Other | Admitting: Family Medicine

## 2017-10-31 ENCOUNTER — Encounter: Payer: Self-pay | Admitting: Family Medicine

## 2017-10-31 VITALS — BP 111/72 | HR 64 | Temp 98.0°F | Ht 63.0 in | Wt 145.5 lb

## 2017-10-31 DIAGNOSIS — F988 Other specified behavioral and emotional disorders with onset usually occurring in childhood and adolescence: Secondary | ICD-10-CM | POA: Diagnosis not present

## 2017-10-31 MED ORDER — AMPHETAMINE-DEXTROAMPHETAMINE 20 MG PO TABS: 20.0000 mg | ORAL_TABLET | Freq: Every day | ORAL | 0 refills | Status: DC

## 2017-10-31 NOTE — Assessment & Plan Note (Signed)
Will increase to 20 mg as this has been working well for her already when she's doubled up her 10 mg dose as discussed via [employer]. Pt aware that she will have to wait until the 30 day mark to get her next script (11/06/17). 3 months sent in.

## 2017-10-31 NOTE — Progress Notes (Signed)
BP 111/72 (BP Location: Left Arm, Patient Position: Sitting, Cuff Size: Normal)   Pulse 64   Temp 98 F (36.7 C)   Ht 5\' 3"  (1.6 m)   Wt 145 lb 8 oz (66 kg)   SpO2 98%   BMI 25.77 kg/m    Subjective:    Patient ID: Cassandra Duarte, female    DOB: 1985/09/13, 32 y.o.   MRN: [id number]  HPI: Cassandra Duarte is a 32 y.o. female  Chief Complaint  Patient presents with  . ADD   Here today for ADD f/u. Feels like when she takes 2 - 10 mg tablets she feels a big improvement but when just taking 1 tablet did not feel anything. Only taking on days she has classes and work in the same day. Ran out about 3 days ago as she's been doubling up. Some decrease in appetite, but still eating adequately and sleeping well. Denies CP, palpitations, SOB, N/V.   Relevant past medical, surgical, family and social history reviewed and updated as indicated. Interim medical history since our last visit reviewed. Allergies and medications reviewed and updated.  Review of Systems  Per HPI unless specifically indicated above     Objective:    BP 111/72 (BP Location: Left Arm, Patient Position: Sitting, Cuff Size: Normal)   Pulse 64   Temp 98 F (36.7 C)   Ht 5\' 3"  (1.6 m)   Wt 145 lb 8 oz (66 kg)   SpO2 98%   BMI 25.77 kg/m   Wt Readings from Last 3 Encounters:  10/31/17 145 lb 8 oz (66 kg)  10/07/17 150 lb (68 kg)  10/06/17 146 lb (66.2 kg)    Physical Exam  Constitutional: She is oriented to person, place, and time. She appears well-developed and well-nourished. No distress.  HENT:  Head: Atraumatic.  Eyes: Conjunctivae and EOM are normal.  Neck: Normal range of motion. Neck supple.  Cardiovascular: Normal rate, regular rhythm and normal heart sounds.  Pulmonary/Chest: Effort normal and breath sounds normal.  Musculoskeletal: Normal range of motion.  Neurological: She is alert and oriented to person, place, and time.  Skin: Skin is warm and dry.  Psychiatric: She has a  normal mood and affect. Her behavior is normal.  Nursing note and vitals reviewed.   Results for orders placed or performed in visit on 10/07/17  GC/Chlamydia Probe Amp  Result Value Ref Range   Chlamydia trachomatis, NAA Negative Negative   Neisseria gonorrhoeae by PCR Negative Negative  Urine drugs of abuse scrn w alc, routine (Ref Lab)  Result Value Ref Range   Amphetamines, Urine Negative Cutoff=1000 ng/mL   Barbiturate Quant, Ur Negative Cutoff=300 ng/mL   Benzodiazepine Quant, Ur Negative Cutoff=300 ng/mL   Cannabinoid Quant, Ur Negative Cutoff=50 ng/mL   Cocaine (Metab.) Negative Cutoff=300 ng/mL   Opiate Quant, Ur See Final Results Cutoff=300 ng/mL   PCP Quant, Ur Negative Cutoff=25 ng/mL   Methadone Screen, Urine Negative Cutoff=300 ng/mL   Propoxyphene Negative Cutoff=300 ng/mL   Ethanol, Urine Negative Cutoff=0.020 %  Opiates Confirmation, Urine  Result Value Ref Range   OPIATES Negative Cutoff=300      Assessment & Plan:   Problem List Items Addressed This Visit      Other   ADD (attention deficit disorder) - Primary    Will increase to 20 mg as this has been working well for her already when she's doubled up her 10 mg dose as discussed via [employer]. Pt aware that  she will have to wait until the 30 day mark to get her next script (11/06/17). 3 months sent in.           Follow up plan: Return in about 3 months (around 01/31/2018) for ADD f/u.

## 2017-10-31 NOTE — Patient Instructions (Signed)
Follow up in 3 months

## 2017-11-04 ENCOUNTER — Ambulatory Visit: Payer: Medicaid Other | Admitting: Family Medicine

## 2017-11-04 ENCOUNTER — Telehealth: Payer: Self-pay | Admitting: Family Medicine

## 2017-11-04 MED ORDER — AMPHETAMINE-DEXTROAMPHETAMINE 20 MG PO TABS: 20.0000 mg | ORAL_TABLET | Freq: Every day | ORAL | 0 refills | Status: DC

## 2017-11-04 NOTE — Telephone Encounter (Signed)
Please call [facility] [address] and cancel the adderall script that is set to drop 10/20 as I just sent over one for immediate pick up to accommodate as she will be out of town  Copied from [employer] [phone]. Topic: Quick Communication - See Telephone Encounter >> Nov 04, 2017  2:39 PM Hewitt Shorts wrote: Pt is having to go out of town tonight due to a family emergency and she is not sure when she is going to return and the rx for her adderall  states it can not be filled until 20th  But she needs it tonight -she is wanting to know if Lavora Brisbon can call the pharmacy [facility] [place] to release that date for them to be able to fill it tonight the rx can not be transferred to where pt is going   Best number [phone]

## 2017-11-04 NOTE — Telephone Encounter (Signed)
Done

## 2017-11-17 DIAGNOSIS — M533 Sacrococcygeal disorders, not elsewhere classified: Secondary | ICD-10-CM | POA: Diagnosis not present

## 2017-11-17 DIAGNOSIS — S3992XA Unspecified injury of lower back, initial encounter: Secondary | ICD-10-CM | POA: Diagnosis not present

## 2017-11-18 ENCOUNTER — Other Ambulatory Visit: Payer: Self-pay

## 2017-11-18 ENCOUNTER — Encounter: Payer: Self-pay | Admitting: Family Medicine

## 2017-11-18 ENCOUNTER — Ambulatory Visit (INDEPENDENT_AMBULATORY_CARE_PROVIDER_SITE_OTHER): Payer: Medicaid Other | Admitting: Family Medicine

## 2017-11-18 VITALS — BP 103/54 | HR 108 | Temp 98.9°F | Ht 63.0 in | Wt 144.0 lb

## 2017-11-18 DIAGNOSIS — M533 Sacrococcygeal disorders, not elsewhere classified: Secondary | ICD-10-CM | POA: Diagnosis not present

## 2017-11-18 MED ORDER — HYDROCODONE-ACETAMINOPHEN 10-325 MG PO TABS: 1.0000 | ORAL_TABLET | Freq: Three times a day (TID) | ORAL | 0 refills | Status: DC | PRN

## 2017-11-18 NOTE — Progress Notes (Signed)
BP (!) 103/54   Pulse (!) 108   Temp 98.9 F (37.2 C) (Oral)   Ht 5\' 3"  (1.6 m)   Wt 144 lb (65.3 kg)   SpO2 94%   BMI 25.51 kg/m    Subjective:    Patient ID: Cassandra Duarte, female    DOB: [birth year], 32 y.o.   MRN: [id number]  HPI: Cassandra Duarte is a 32 y.o. female  Chief Complaint  Patient presents with  . Hip Pain    pt states she feon her buttocks this past Wednesday/ Went to Urgent care yesterday with buttocks pain and got an X-rays done   URGENT CARE FOLLOW UP- fell down steps on 11/16/17 onto her tailbone and went to the urgent care on 11/17/17 due to back/buttock pain and hurting to urinate.  Time since discharge: 1 day Hospital/facility: [facility] Diagnosis: Tailbone injury, ?fracture to the coccyx Procedures/tests: x-ray Consultants: None New medications: ibuprofen and flexeril Discharge instructions: sit on donut and recheck here if not better next week.   Status: worse  She is here today because she is in intense pain. She notes that she is very swollen on her buttock. She notes that she has been taking 2400mg  ibuprofen at once. Having severe pain in her tailbone with radiation down her L leg. Trouble sleeping, sitting. Better with movement. No other concerns or complaints at this time.   Relevant past medical, surgical, family and social history reviewed and updated as indicated. Interim medical history since our last visit reviewed. Allergies and medications reviewed and updated.  Review of Systems  Constitutional: Negative.   Respiratory: Negative.   Cardiovascular: Negative.   Musculoskeletal: Positive for back pain and myalgias. Negative for arthralgias, gait problem, joint swelling, neck pain and neck stiffness.  Skin: Negative.   Neurological: Negative.   Psychiatric/Behavioral: Negative.     Per HPI unless specifically indicated above     Objective:    BP (!) 103/54   Pulse (!) 108   Temp 98.9 F (37.2 C)  (Oral)   Ht 5\' 3"  (1.6 m)   Wt 144 lb (65.3 kg)   SpO2 94%   BMI 25.51 kg/m   Wt Readings from Last 3 Encounters:  11/18/17 144 lb (65.3 kg)  10/31/17 145 lb 8 oz (66 kg)  10/07/17 150 lb (68 kg)    Physical Exam  Constitutional: She is oriented to person, place, and time. She appears well-developed and well-nourished. She appears distressed.  HENT:  Head: Normocephalic and atraumatic.  Right Ear: Hearing normal.  Left Ear: Hearing normal.  Nose: Nose normal.  Eyes: Conjunctivae and lids are normal. Right eye exhibits no discharge. Left eye exhibits no discharge. No scleral icterus.  Cardiovascular: Normal rate, regular rhythm, normal heart sounds and intact distal pulses. Exam reveals no gallop and no friction rub.  No murmur heard. Pulmonary/Chest: Effort normal and breath sounds normal. No stridor. No respiratory distress. She has no wheezes. She has no rales. She exhibits no tenderness.  Musculoskeletal: Normal range of motion. She exhibits edema and tenderness (bruising and tenderness over tailbone).  Neurological: She is alert and oriented to person, place, and time.  Skin: Skin is warm, dry and intact. Capillary refill takes less than 2 seconds. No rash noted. She is not diaphoretic. No erythema. No pallor.  Psychiatric: She has a normal mood and affect. Her speech is normal and behavior is normal. Judgment and thought content normal. Cognition and memory are normal.  Nursing  note and vitals reviewed.   Results for orders placed or performed in visit on 10/07/17  GC/Chlamydia Probe Amp  Result Value Ref Range   Chlamydia trachomatis, NAA Negative Negative   Neisseria gonorrhoeae by PCR Negative Negative  Urine drugs of abuse scrn w alc, routine (Ref Lab)  Result Value Ref Range   Amphetamines, Urine Negative Cutoff=1000 ng/mL   Barbiturate Quant, Ur Negative Cutoff=300 ng/mL   Benzodiazepine Quant, Ur Negative Cutoff=300 ng/mL   Cannabinoid Quant, Ur Negative Cutoff=50  ng/mL   Cocaine (Metab.) Negative Cutoff=300 ng/mL   Opiate Quant, Ur See Final Results Cutoff=300 ng/mL   PCP Quant, Ur Negative Cutoff=25 ng/mL   Methadone Screen, Urine Negative Cutoff=300 ng/mL   Propoxyphene Negative Cutoff=300 ng/mL   Ethanol, Urine Negative Cutoff=0.020 %  Opiates Confirmation, Urine  Result Value Ref Range   OPIATES Negative Cutoff=300      Assessment & Plan:   Problem List Items Addressed This Visit    None    Visit Diagnoses    Tail bone pain    -  Primary   Records from [facility] for x-ray not availble paitient in significant pain. Vicodin given. No more than 2400mg  ibprofen in 24 hours. Referral to Ortho. Cont monitor.    Relevant Medications   ibuprofen (ADVIL,MOTRIN) 800 MG tablet   HYDROcodone-acetaminophen (NORCO) 10-325 MG tablet   Other Relevant Orders   Ambulatory referral to Orthopedic Surgery       Follow up plan: Return if symptoms worsen or fail to improve.

## 2017-11-18 NOTE — Patient Instructions (Signed)
Tailbone Injury The tailbone (coccyx) is the small bone at the lower end of the spine. A tailbone injury may involve stretched ligaments, bruising, or a broken bone (fracture). Tailbone injuries can be painful, and some may take a long time to heal. What are the causes? This condition is often caused by falling and landing on the tailbone. Other causes include:  Repeated strain or friction from actions such as rowing and bicycling.  Childbirth.  In some cases, the cause may not be known. What increases the risk? This condition is more common in women than in men. What are the signs or symptoms? Symptoms of this condition include:  Pain in the lower back, especially when sitting.  Pain or difficulty when standing up from a sitting position.  Bruising in the tailbone area.  Painful bowel movements.  In women, pain during intercourse.  How is this diagnosed? This condition may be diagnosed based on your symptoms and a physical exam. X-rays may be taken if a fracture is suspected. You may also have other tests, such as a CT scan or MRI. How is this treated? This condition may be treated with medicines to help relieve your pain. Most tailbone injuries heal on their own in 4-6 weeks. However, recovery time may be longer if the injury involves a fracture. Follow these instructions at home:  Take medicines only as directed by your health care provider.  If directed, apply ice to the injured area: ? Put ice in a plastic bag. ? Place a towel between your skin and the bag. ? Leave the ice on for 20 minutes, 2-3 times per day for the first 1-2 days.  Sit on a large, rubber or inflated ring or cushion to ease your pain. Lean forward when you are sitting to help decrease discomfort.  Avoid sitting for long periods of time.  Increase your activity as the pain allows. Perform any exercises that are recommended by your health care provider or physical therapist.  If you have pain during  bowel movements, use stool softeners as directed by your health care provider.  Eat a diet that includes plenty of fiber to help prevent constipation.  Keep all follow-up visits as directed by your health care provider. This is important. How is this prevented? Wear appropriate padding and sports gear when bicycling and rowing. This can help to prevent developing an injury that is caused by repeated strain or friction. Contact a health care provider if:  Your pain becomes worse.  Your bowel movements cause a great deal of discomfort.  You are unable to have a bowel movement.  You have uncontrolled urine loss (urinary incontinence).  You have a fever. This information is not intended to replace advice given to you by your health care provider. Make sure you discuss any questions you have with your health care provider. Document Released: 01/02/2000 Document Revised: 09/04/2015 Document Reviewed: 12/31/2013 Elsevier Interactive Patient Education  [employer].

## 2017-11-23 DIAGNOSIS — S3210XA Unspecified fracture of sacrum, initial encounter for closed fracture: Secondary | ICD-10-CM | POA: Diagnosis not present

## 2017-11-23 DIAGNOSIS — M5136 Other intervertebral disc degeneration, lumbar region: Secondary | ICD-10-CM | POA: Diagnosis not present

## 2017-11-29 ENCOUNTER — Ambulatory Visit: Payer: Medicaid Other | Admitting: Family Medicine

## 2017-11-29 ENCOUNTER — Encounter: Payer: Self-pay | Admitting: Family Medicine

## 2017-11-29 VITALS — BP 112/73 | HR 90 | Temp 97.9°F | Ht 63.0 in | Wt 143.7 lb

## 2017-11-29 DIAGNOSIS — S322XXD Fracture of coccyx, subsequent encounter for fracture with routine healing: Secondary | ICD-10-CM | POA: Diagnosis not present

## 2017-11-29 DIAGNOSIS — Z79899 Other long term (current) drug therapy: Secondary | ICD-10-CM | POA: Diagnosis not present

## 2017-11-29 MED ORDER — PREDNISONE 20 MG PO TABS: 20.0000 mg | ORAL_TABLET | Freq: Every day | ORAL | 0 refills | Status: DC

## 2017-11-29 NOTE — Assessment & Plan Note (Signed)
Warning given today about breach of contract filling hydrocodone given by [facility] last week. Pt is apologetic and states she was unaware she would need to call for permission to fill. Aware that if this happens again her contract will be terminated and she will no longer receive her controlled substances through this clinic.

## 2017-11-29 NOTE — Progress Notes (Signed)
BP 112/73 (BP Location: Right Arm, Patient Position: Sitting, Cuff Size: Normal)   Pulse 90   Temp 97.9 F (36.6 C) (Oral)   Ht 5\' 3"  (1.6 m)   Wt 143 lb 11.2 oz (65.2 kg)   SpO2 97%   BMI 25.46 kg/m    Subjective:    Patient ID: Cassandra Duarte, female    DOB: 1985-08-21, 32 y.o.   MRN: [id number]  HPI: Cassandra Duarte is a 32 y.o. female  Chief Complaint  Patient presents with  . Tailbone Pain    Patient bent over on Saturday and she stated something popped. Saw [facility]. Pain has increased.    Here today with low back pain and stiffness from recent coccyx fx. Originally saw UC after fall onto buttocks over a week ago, where she was told x-ray showed possible fx. Given 800 mg ibuprofen which she reports did not touch the pain. Seen in clinic 11/1 and given hydrocodone prn. Saw [facility] 1 week ago and told she definitely fractured her coccyx and given another supply of hydrocodone. Pain was improving, but since doing some lifting the other day things have worsened again. Taking OTC pain relievers and hydrocodone with some relief. Seems to be agitating her sciatica down right leg.    Relevant past medical, surgical, family and social history reviewed and updated as indicated. Interim medical history since our last visit reviewed. Allergies and medications reviewed and updated.  Review of Systems  Per HPI unless specifically indicated above     Objective:    BP 112/73 (BP Location: Right Arm, Patient Position: Sitting, Cuff Size: Normal)   Pulse 90   Temp 97.9 F (36.6 C) (Oral)   Ht 5\' 3"  (1.6 m)   Wt 143 lb 11.2 oz (65.2 kg)   SpO2 97%   BMI 25.46 kg/m   Wt Readings from Last 3 Encounters:  11/29/17 143 lb 11.2 oz (65.2 kg)  11/18/17 144 lb (65.3 kg)  10/31/17 145 lb 8 oz (66 kg)    Physical Exam  Constitutional: She is oriented to person, place, and time. She appears well-developed and well-nourished. No distress.  HENT:  Head:  Atraumatic.  Eyes: Conjunctivae and EOM are normal.  Neck: Normal range of motion. Neck supple.  Cardiovascular: Normal rate and regular rhythm.  Pulmonary/Chest: Effort normal and breath sounds normal.  Musculoskeletal: She exhibits tenderness (TTP sacral area).  Antalgic gait -SLR  Neurological: She is alert and oriented to person, place, and time. No cranial nerve deficit.  Skin: Skin is warm and dry.  Psychiatric: She has a normal mood and affect. Her behavior is normal.  Nursing note and vitals reviewed.   Results for orders placed or performed in visit on 10/07/17  GC/Chlamydia Probe Amp  Result Value Ref Range   Chlamydia trachomatis, NAA Negative Negative   Neisseria gonorrhoeae by PCR Negative Negative  Urine drugs of abuse scrn w alc, routine (Ref Lab)  Result Value Ref Range   Amphetamines, Urine Negative Cutoff=1000 ng/mL   Barbiturate Quant, Ur Negative Cutoff=300 ng/mL   Benzodiazepine Quant, Ur Negative Cutoff=300 ng/mL   Cannabinoid Quant, Ur Negative Cutoff=50 ng/mL   Cocaine (Metab.) Negative Cutoff=300 ng/mL   Opiate Quant, Ur See Final Results Cutoff=300 ng/mL   PCP Quant, Ur Negative Cutoff=25 ng/mL   Methadone Screen, Urine Negative Cutoff=300 ng/mL   Propoxyphene Negative Cutoff=300 ng/mL   Ethanol, Urine Negative Cutoff=0.020 %  Opiates Confirmation, Urine  Result Value Ref Range   OPIATES  Negative Cutoff=300      Assessment & Plan:   Problem List Items Addressed This Visit      Other   Controlled substance agreement signed    Warning given today about breach of contract filling hydrocodone given by [facility] last week. Pt is apologetic and states she was unaware she would need to call for permission to fill. Aware that if this happens again her contract will be terminated and she will no longer receive her controlled substances through this clinic.        Other Visit Diagnoses    Closed fracture of coccyx with routine healing,  subsequent encounter    -  Primary   Will start low dose prednisone, rest, lidocaine patches, heat. May need higher dose/taper pack later but orthopedics suggests waiting several weeks on that    Greater than 25 min spent in direct patient care/counseling today.  Follow up plan: Return for as scheduled.

## 2017-11-29 NOTE — Patient Instructions (Signed)
Follow up as scheduled.  

## 2017-11-30 ENCOUNTER — Telehealth: Payer: Self-pay | Admitting: Family Medicine

## 2017-11-30 NOTE — Telephone Encounter (Signed)
Patient notified.  Patient stated she called the orthopedic doctor before making an appointment here, but he didn't call her back until yesterday afternoon. She explained she was given prednisone and patient stated he wanted her to wait 1 more week before starting prednisone or it can slow her healing process down.

## 2017-11-30 NOTE — Telephone Encounter (Signed)
Noted, let her know this is ok with [place]  Copied from [place] [phone]. Topic: General - Other >> Nov 30, 2017  8:33 AM Janace Aris A wrote: Reason for CRM: Pt called in wanting to advise Merrie Roof that her ortho doctor prescribed her 5 days 15 tablets hydro-condone. She says he advised her not to start the prednisone until a week so it doesn't interfere with the healing of her tail bone.

## 2017-12-05 ENCOUNTER — Telehealth: Payer: Self-pay | Admitting: Family Medicine

## 2017-12-05 NOTE — Telephone Encounter (Signed)
Called pharmacy. They state that the last time this medication was filled was in September. Pharmacy states that they have 3 prescriptions for Adderall on file for the patient. They state that they spoke with the patient shortly after lunch and told her this information. They state that they told her that they were going to get one ready for her shortly. Will send patient a Mychart message and let her know.

## 2017-12-05 NOTE — Telephone Encounter (Signed)
Pt states that she has ran out of adderall as of today states that she doubled up in November and needs a refill [facility].Pt's phone is broke would like to be notified via My Chart if anything is wrong

## 2017-12-05 NOTE — Telephone Encounter (Signed)
Can you call the pharmacy and see what she has available/last fill dates? It looks confusing in the chart right now like I wrote for two scripts in October and the November 20th one was completed already.

## 2017-12-09 DIAGNOSIS — R0602 Shortness of breath: Secondary | ICD-10-CM | POA: Diagnosis not present

## 2017-12-09 DIAGNOSIS — J209 Acute bronchitis, unspecified: Secondary | ICD-10-CM | POA: Diagnosis not present

## 2017-12-29 ENCOUNTER — Encounter: Payer: Medicaid Other | Admitting: Nurse Practitioner

## 2018-01-05 ENCOUNTER — Encounter: Payer: Self-pay | Admitting: Family Medicine

## 2018-01-05 ENCOUNTER — Other Ambulatory Visit: Payer: Self-pay

## 2018-01-05 ENCOUNTER — Ambulatory Visit (INDEPENDENT_AMBULATORY_CARE_PROVIDER_SITE_OTHER): Payer: Medicaid Other | Admitting: Family Medicine

## 2018-01-05 ENCOUNTER — Telehealth: Payer: Self-pay | Admitting: Family Medicine

## 2018-01-05 ENCOUNTER — Ambulatory Visit
Admission: RE | Admit: 2018-01-05 | Discharge: 2018-01-05 | Disposition: A | Discharge status: Home / Self Care | Payer: Medicaid Other | Source: Ambulatory Visit | Attending: Family Medicine | Admitting: Family Medicine

## 2018-01-05 ENCOUNTER — Ambulatory Visit: Payer: Medicaid Other | Admitting: Family Medicine

## 2018-01-05 ENCOUNTER — Ambulatory Visit: Admission: RE | Admit: 2018-01-05 | Payer: Medicaid Other | Source: Ambulatory Visit

## 2018-01-05 VITALS — BP 120/87 | HR 87 | Temp 98.6°F | Ht 63.0 in | Wt 135.0 lb

## 2018-01-05 DIAGNOSIS — R1031 Right lower quadrant pain: Secondary | ICD-10-CM

## 2018-01-05 DIAGNOSIS — K7689 Other specified diseases of liver: Secondary | ICD-10-CM | POA: Diagnosis not present

## 2018-01-05 LAB — CBC WITH DIFFERENTIAL/PLATELET
Hematocrit: 38.3 % (ref 34.0–46.6)
Hemoglobin: 13.8 g/dL (ref 11.1–15.9)
Lymphocytes Absolute: 3.2 10*3/uL — ABNORMAL HIGH (ref 0.7–3.1)
Lymphs: 48 %
MCH: 31.4 pg (ref 26.6–33.0)
MCHC: 36 g/dL — ABNORMAL HIGH (ref 31.5–35.7)
MCV: 87 fL (ref 79–97)
MID (Absolute): 1 10*3/uL (ref 0.1–1.6)
MID: 16 %
Neutrophils Absolute: 2.5 10*3/uL (ref 1.4–7.0)
Neutrophils: 36 %
Platelets: 188 10*3/uL (ref 150–450)
RBC: 4.4 x10E6/uL (ref 3.77–5.28)
RDW: 13.3 % (ref 12.3–15.4)
WBC: 6.7 10*3/uL (ref 3.4–10.8)

## 2018-01-05 LAB — MICROSCOPIC EXAMINATION

## 2018-01-05 LAB — UA/M W/RFLX CULTURE, ROUTINE
Bilirubin, UA: NEGATIVE
Glucose, UA: NEGATIVE
Ketones, UA: NEGATIVE
Leukocytes, UA: NEGATIVE
NITRITE UA: NEGATIVE
RBC, UA: NEGATIVE
Specific Gravity, UA: >1.03 — ABNORMAL HIGH (ref 1.005–1.030)
Urobilinogen, Ur: 1 mg/dL (ref 0.2–1.0)
pH, UA: 5.5 (ref 5.0–7.5)

## 2018-01-05 LAB — PREGNANCY, URINE: Preg Test, Ur: NEGATIVE

## 2018-01-05 MED ORDER — IOPAMIDOL (ISOVUE-300) INJECTION 61%
85.0000 mL | Freq: Once | INTRAVENOUS | Status: AC | PRN
  Administered 2018-01-05: 85 mL via INTRAVENOUS

## 2018-01-05 NOTE — Telephone Encounter (Signed)
Copied from [place] [id number]. Topic: Quick Communication - See Telephone Encounter >> Jan 05, 2018 11:00 AM Bea Graff, NT wrote: CRM for notification. See Telephone encounter for: 01/05/18. Pt states she was seen this morning and Merrie Roof advised her to go the ER. Pt states her daughter is sick and she does not have a sitter and can't go to the ER. She wants to see if the outpatient CT scan can still be ordered? Also if she gets a Actuary by this afternoon she will go to the ER or later this evening.

## 2018-01-05 NOTE — Progress Notes (Signed)
BP 120/87   Pulse 87   Temp 98.6 F (37 C) (Oral)   Ht 5\' 3"  (1.6 m)   Wt 135 lb (61.2 kg)   SpO2 97%   BMI 23.91 kg/m    Subjective:    Patient ID: Cassandra Duarte, female    DOB: 08/16/1985, 32 y.o.   MRN: [id number]  HPI: Cassandra Duarte is a 32 y.o. female  Chief Complaint  Patient presents with  . Pain    pt states has had a sharp pain on her right side abdomen since few days ago  . Breast Mass    right breast noticed about 2 weeks ago   Here today with sharp right sided abdominal pain x 5 days with some localized right mid-abdominal soreness. States she's been without an appetite for the duration and has barely eaten or drank since. Keeping down what she's eating. No fevers, chills, diarrhea, constipation, vomiting, urinary sxs. Does have know cysts on her liver for which she's had an u/s and CT abdomen earlier this year. Hx of kidney stones but states this doesn't feel consistent. Has had a tubal ligation and adhesion lysis procedures but otherwise no other abdominal surgeries.   Relevant past medical, surgical, family and social history reviewed and updated as indicated. Interim medical history since our last visit reviewed. Allergies and medications reviewed and updated.  Review of Systems  Per HPI unless specifically indicated above     Objective:    BP 120/87   Pulse 87   Temp 98.6 F (37 C) (Oral)   Ht 5\' 3"  (1.6 m)   Wt 135 lb (61.2 kg)   SpO2 97%   BMI 23.91 kg/m   Wt Readings from Last 3 Encounters:  01/05/18 135 lb (61.2 kg)  11/29/17 143 lb 11.2 oz (65.2 kg)  11/18/17 144 lb (65.3 kg)    Physical Exam Vitals signs and nursing note reviewed.  Constitutional:      Appearance: Normal appearance.     Comments: Appears in pain  HENT:     Head: Atraumatic.  Eyes:     Extraocular Movements: Extraocular movements intact.     Conjunctiva/sclera: Conjunctivae normal.  Neck:     Musculoskeletal: Normal range of motion and neck supple.    Cardiovascular:     Rate and Rhythm: Normal rate and regular rhythm.     Pulses: Normal pulses.     Heart sounds: Normal heart sounds.  Pulmonary:     Effort: Pulmonary effort is normal.     Breath sounds: Normal breath sounds.  Abdominal:     General: Bowel sounds are normal. There is no distension.     Palpations: Abdomen is soft. There is no mass.     Tenderness: There is abdominal tenderness (localized ttp right mid-abdomen). There is guarding (mild). There is no rebound.  Musculoskeletal: Normal range of motion.        General: No tenderness (no cva ttp).  Skin:    General: Skin is warm and dry.  Neurological:     General: No focal deficit present.     Mental Status: She is alert and oriented to person, place, and time.  Psychiatric:        Mood and Affect: Mood normal.        Thought Content: Thought content normal.        Judgment: Judgment normal.     Results for orders placed or performed in visit on 01/05/18  Microscopic Examination  Result Value Ref Range   WBC, UA 0-5 0 - 5 /hpf   RBC, UA 0-2 0 - 2 /hpf   Epithelial Cells (non renal) 0-10 0 - 10 /hpf   Mucus, UA Present (A) Not Estab.   Bacteria, UA Few (A) None seen/Few  Pregnancy, urine  Result Value Ref Range   Preg Test, Ur Negative Negative  CBC With Differential/Platelet  Result Value Ref Range   WBC 6.7 3.4 - 10.8 x10E3/uL   RBC 4.40 3.77 - 5.28 x10E6/uL   Hemoglobin 13.8 11.1 - 15.9 g/dL   Hematocrit 38.3 34.0 - 46.6 %   MCV 87 79 - 97 fL   MCH 31.4 26.6 - 33.0 pg   MCHC 36.0 (H) 31.5 - 35.7 g/dL   RDW 13.3 12.3 - 15.4 %   Platelets 188 150 - 450 x10E3/uL   Neutrophils 36 Not Estab. %   Lymphs 48 Not Estab. %   MID 16 Not Estab. %   Neutrophils Absolute 2.5 1.4 - 7.0 x10E3/uL   Lymphocytes Absolute 3.2 (H) 0.7 - 3.1 x10E3/uL   MID (Absolute) 1.0 0.1 - 1.6 X10E3/uL  UA/M w/rflx Culture, Routine  Result Value Ref Range   Specific Gravity, UA >1.030 (H) 1.005 - 1.030   pH, UA 5.5 5.0 - 7.5    Color, UA Yellow Yellow   Appearance Ur Clear Clear   Leukocytes, UA Negative Negative   Protein, UA 1+ (A) Negative/Trace   Glucose, UA Negative Negative   Ketones, UA Negative Negative   RBC, UA Negative Negative   Bilirubin, UA Negative Negative   Urobilinogen, Ur 1.0 0.2 - 1.0 mg/dL   Nitrite, UA Negative Negative   Microscopic Examination See below:       Assessment & Plan:   Problem List Items Addressed This Visit    None    Visit Diagnoses    Right lower quadrant abdominal pain    -  Primary   Relevant Orders   Pregnancy, urine (Completed)   CBC With Differential/Platelet (Completed)   UA/M w/rflx Culture, Routine (Completed)   CT Abdomen Pelvis W Contrast    Vitals benign, urine pregnancy negative, U/A and CBC benign. Exam revealing localized right abdominal tenderness in area of patient's sharp stabbing pain. 8 lb weight loss since last seen a few weeks ago and not eating x 5 days. Recommended ER for IV fluids, pain management, and stat imaging but patient without babysitter. Will order stat CT abdomen pelvis but discussed with patient that if sxs worsening or unable to schedule imaging today will need to go to ER for further management. Pt agreeable to this plan.    Follow up plan: Return if symptoms worsen or fail to improve.

## 2018-01-05 NOTE — Telephone Encounter (Signed)
Spoke with [facility], She has CT of abdomin and pelvis results to report to [facility] [facility]. Call transferred to office via [place].

## 2018-01-05 NOTE — Telephone Encounter (Signed)
Copied from [place] [id number]. Topic: Quick Communication - See Telephone Encounter >> Jan 05, 2018  4:58 PM Blase Mess A wrote: CRM for notification. See Telephone encounter for: 01/05/18.  Patient is calling to receive the results of her CT scan please call [phone]

## 2018-01-06 NOTE — Telephone Encounter (Signed)
Had released results via mychart - please make sure she got them

## 2018-01-06 NOTE — Telephone Encounter (Signed)
Lab results read to patient. "Your CT results came back stable from your CT earlier this year - no acute changes to account for your pain and the liver findings are unchanged. Try to take it easy and eat bland foods as you're able and make sure to follow up with your GI specialist who was following your liver cysts as soon as you are able. Call or go to the ER with any changes or worsening "

## 2018-01-13 ENCOUNTER — Other Ambulatory Visit: Payer: Medicaid Other

## 2018-01-19 NOTE — Op Note (Signed)
ADDENDUM: Due to the complex nature of the procedure, and the lack of availability of a qualified first assistant, Dr. Amalia Hailey assisted with adhesiolysis, in order to restore normal anatomy in the case.  Brayton Mars, MD

## 2018-01-31 ENCOUNTER — Ambulatory Visit: Payer: Medicaid Other | Admitting: Family Medicine

## 2018-01-31 ENCOUNTER — Encounter: Payer: Self-pay | Admitting: Family Medicine

## 2018-01-31 VITALS — BP 116/78 | HR 99 | Temp 98.3°F | Ht 63.0 in | Wt 131.3 lb

## 2018-01-31 DIAGNOSIS — F988 Other specified behavioral and emotional disorders with onset usually occurring in childhood and adolescence: Secondary | ICD-10-CM

## 2018-01-31 DIAGNOSIS — R1011 Right upper quadrant pain: Secondary | ICD-10-CM | POA: Diagnosis not present

## 2018-01-31 MED ORDER — AMPHETAMINE-DEXTROAMPHETAMINE 20 MG PO TABS: 20.0000 mg | ORAL_TABLET | Freq: Every day | ORAL | 0 refills | Status: DC

## 2018-01-31 NOTE — Progress Notes (Signed)
BP 116/78 (BP Location: Right Arm, Patient Position: Sitting, Cuff Size: Normal)   Pulse 99   Temp 98.3 F (36.8 C) (Oral)   Ht 5\' 3"  (1.6 m)   Wt 131 lb 4.8 oz (59.6 kg)   SpO2 100%   BMI 23.26 kg/m    Subjective:    Patient ID: Cassandra Duarte, female    DOB: 12-12-1985, 33 y.o.   MRN: [id number]  HPI: Cassandra Duarte is a 33 y.o. female  Chief Complaint  Patient presents with  . ADD    Follow-up  . Abdominal Pain    Right side abdominal pain, needs new referral to GI specialist.    Still having RUQ abdominal pain that seems to happen . Daughter does have celiac, now wondering if that could be going on. Recent CT abdomen normal other than stable liver masses that have not changed since discovery. Continues to lose weight and now down about 15 lb the past few months, but notes she stopped breastfeeding recently and has been much more active. Has also cut down on her portion sizes since stopping nursing. Does not feel the adderall has affected her appetite and notes this didn't start until well after starting the medicine.   Continues to do very well on her adderall. Works, goes to school, and has 3 children and has noticed a major improvement in her ability to focus and complete tasks since starting it. Has even had a professor note a change in her for the better. Due for next fill on the 18th. Denies CP, SOB, appetite changes, palpitations, sleep disturbances.   Past Medical History:  Diagnosis Date  . Family history of adverse reaction to anesthesia    dad-n/v  . History of kidney stones    h/o  . Vaginal Pap smear, abnormal 2012   REPEAT AND WAS NORMAL   Social History   Socioeconomic History  . Marital status: Single    Spouse name: Not on file  . Number of children: Not on file  . Years of education: Not on file  . Highest education level: Not on file  Occupational History  . Not on file  Social Needs  . Financial resource strain: Not on file  .  Food insecurity:    Worry: Not on file    Inability: Not on file  . Transportation needs:    Medical: Not on file    Non-medical: Not on file  Tobacco Use  . Smoking status: Former Smoker    Packs/day: 0.50    Years: 4.00    Pack years: 2.00    Types: Cigarettes    Last attempt to quit: 04/02/2014    Years since quitting: 3.8  . Smokeless tobacco: Never Used  Substance and Sexual Activity  . Alcohol use: No    Alcohol/week: 0.0 standard drinks  . Drug use: No  . Sexual activity: Yes    Birth control/protection: None, Surgical  Lifestyle  . Physical activity:    Days per week: Not on file    Minutes per session: Not on file  . Stress: Not on file  Relationships  . Social connections:    Talks on phone: Not on file    Gets together: Not on file    Attends religious service: Not on file    Active member of club or organization: Not on file    Attends meetings of clubs or organizations: Not on file    Relationship status: Not on file  .  Intimate partner violence:    Fear of current or ex partner: Not on file    Emotionally abused: Not on file    Physically abused: Not on file    Forced sexual activity: Not on file  Other Topics Concern  . Not on file  Social History Narrative  . Not on file   Relevant past medical, surgical, family and social history reviewed and updated as indicated. Interim medical history since our last visit reviewed. Allergies and medications reviewed and updated.  Review of Systems  Per HPI unless specifically indicated above     Objective:    BP 116/78 (BP Location: Right Arm, Patient Position: Sitting, Cuff Size: Normal)   Pulse 99   Temp 98.3 F (36.8 C) (Oral)   Ht 5\' 3"  (1.6 m)   Wt 131 lb 4.8 oz (59.6 kg)   SpO2 100%   BMI 23.26 kg/m   Wt Readings from Last 3 Encounters:  01/31/18 131 lb 4.8 oz (59.6 kg)  01/05/18 135 lb (61.2 kg)  11/29/17 143 lb 11.2 oz (65.2 kg)    Physical Exam Vitals signs and nursing note reviewed.    Constitutional:      Appearance: Normal appearance. She is not ill-appearing.  HENT:     Head: Atraumatic.  Eyes:     Extraocular Movements: Extraocular movements intact.     Conjunctiva/sclera: Conjunctivae normal.  Neck:     Musculoskeletal: Normal range of motion and neck supple.  Cardiovascular:     Rate and Rhythm: Normal rate and regular rhythm.     Heart sounds: Normal heart sounds.  Pulmonary:     Effort: Pulmonary effort is normal.     Breath sounds: Normal breath sounds.  Abdominal:     General: Bowel sounds are normal.     Palpations: Abdomen is soft. There is no mass.     Tenderness: There is no abdominal tenderness. There is no guarding.  Musculoskeletal: Normal range of motion.  Skin:    General: Skin is warm and dry.  Neurological:     Mental Status: She is alert and oriented to person, place, and time.  Psychiatric:        Mood and Affect: Mood normal.        Thought Content: Thought content normal.        Judgment: Judgment normal.     Results for orders placed or performed in visit on 01/05/18  Microscopic Examination  Result Value Ref Range   WBC, UA 0-5 0 - 5 /hpf   RBC, UA 0-2 0 - 2 /hpf   Epithelial Cells (non renal) 0-10 0 - 10 /hpf   Mucus, UA Present (A) Not Estab.   Bacteria, UA Few (A) None seen/Few  Pregnancy, urine  Result Value Ref Range   Preg Test, Ur Negative Negative  CBC With Differential/Platelet  Result Value Ref Range   WBC 6.7 3.4 - 10.8 x10E3/uL   RBC 4.40 3.77 - 5.28 x10E6/uL   Hemoglobin 13.8 11.1 - 15.9 g/dL   Hematocrit 38.3 34.0 - 46.6 %   MCV 87 79 - 97 fL   MCH 31.4 26.6 - 33.0 pg   MCHC 36.0 (H) 31.5 - 35.7 g/dL   RDW 13.3 12.3 - 15.4 %   Platelets 188 150 - 450 x10E3/uL   Neutrophils 36 Not Estab. %   Lymphs 48 Not Estab. %   MID 16 Not Estab. %   Neutrophils Absolute 2.5 1.4 - 7.0 x10E3/uL   Lymphocytes  Absolute 3.2 (H) 0.7 - 3.1 x10E3/uL   MID (Absolute) 1.0 0.1 - 1.6 X10E3/uL  UA/M w/rflx Culture, Routine   Result Value Ref Range   Specific Gravity, UA >1.030 (H) 1.005 - 1.030   pH, UA 5.5 5.0 - 7.5   Color, UA Yellow Yellow   Appearance Ur Clear Clear   Leukocytes, UA Negative Negative   Protein, UA 1+ (A) Negative/Trace   Glucose, UA Negative Negative   Ketones, UA Negative Negative   RBC, UA Negative Negative   Bilirubin, UA Negative Negative   Urobilinogen, Ur 1.0 0.2 - 1.0 mg/dL   Nitrite, UA Negative Negative   Microscopic Examination See below:       Assessment & Plan:   Problem List Items Addressed This Visit      Other   ADD (attention deficit disorder) - Primary    Stable and doing very well on adderall 20 mg. Continue current regimen       Other Visit Diagnoses    RUQ pain       Will run celiac screening and refer her back to [facility] as requested. Can try removing certain food groups for periods of time in meantime   Relevant Orders   Ambulatory referral to Gastroenterology   Tissue Transglutaminase Abs,IgG,IgA       Follow up plan: Return in about 3 months (around 05/02/2018) for ADD f/u.

## 2018-01-31 NOTE — Assessment & Plan Note (Signed)
Stable and doing very well on adderall 20 mg. Continue current regimen

## 2018-02-01 LAB — TISSUE TRANSGLUTAMINASE ABS,IGG,IGA
Tissue Transglut Ab: <2 U/mL (ref 0–5)
Transglutaminase IgA: <2 U/mL (ref 0–3)

## 2018-02-08 DIAGNOSIS — M62830 Muscle spasm of back: Secondary | ICD-10-CM | POA: Diagnosis not present

## 2018-02-08 DIAGNOSIS — M9903 Segmental and somatic dysfunction of lumbar region: Secondary | ICD-10-CM | POA: Diagnosis not present

## 2018-02-08 DIAGNOSIS — M9901 Segmental and somatic dysfunction of cervical region: Secondary | ICD-10-CM | POA: Diagnosis not present

## 2018-02-08 DIAGNOSIS — M5431 Sciatica, right side: Secondary | ICD-10-CM | POA: Diagnosis not present

## 2018-02-21 DIAGNOSIS — M5416 Radiculopathy, lumbar region: Secondary | ICD-10-CM | POA: Diagnosis not present

## 2018-02-22 ENCOUNTER — Telehealth: Payer: Self-pay | Admitting: Family Medicine

## 2018-02-22 NOTE — Telephone Encounter (Signed)
Patient notified

## 2018-02-22 NOTE — Telephone Encounter (Signed)
Noted, please call and let her know that is fine  Copied from [place] [id number]. Topic: General - Other >> Feb 21, 2018  4:24 PM Leward Quan A wrote: Reason for CRM: Patient called to inform Dr Orene Desanctis that she re injured her tail bone and was seen at [facility] and was prescribed Prednisone and a pain medication. She had not picked it up from the pharmacy but think it may be Hydrocodone 5-325. She stated that she was told by Dr Orene Desanctis to inform if ever she receive any medication from another provider.

## 2018-02-23 ENCOUNTER — Ambulatory Visit: Payer: Medicaid Other | Admitting: Family Medicine

## 2018-02-23 ENCOUNTER — Other Ambulatory Visit: Payer: Self-pay

## 2018-02-23 ENCOUNTER — Encounter: Payer: Self-pay | Admitting: Family Medicine

## 2018-02-23 VITALS — BP 99/65 | HR 103 | Temp 98.4°F | Ht 63.0 in | Wt 129.0 lb

## 2018-02-23 DIAGNOSIS — M5432 Sciatica, left side: Secondary | ICD-10-CM | POA: Diagnosis not present

## 2018-02-23 DIAGNOSIS — R110 Nausea: Secondary | ICD-10-CM | POA: Diagnosis not present

## 2018-02-23 MED ORDER — CYCLOBENZAPRINE HCL 10 MG PO TABS: 10.0000 mg | ORAL_TABLET | Freq: Three times a day (TID) | ORAL | 0 refills | Status: DC | PRN

## 2018-02-23 MED ORDER — ONDANSETRON 4 MG PO TBDP: 4.0000 mg | ORAL_TABLET | Freq: Three times a day (TID) | ORAL | 0 refills | Status: DC | PRN

## 2018-02-23 MED ORDER — HYDROCODONE-ACETAMINOPHEN 5-325 MG PO TABS: 1.0000 | ORAL_TABLET | Freq: Four times a day (QID) | ORAL | 0 refills | Status: DC | PRN

## 2018-02-23 MED ORDER — SUCRALFATE 1 G PO TABS: 1.0000 g | ORAL_TABLET | Freq: Three times a day (TID) | ORAL | 0 refills | Status: DC

## 2018-02-23 NOTE — Progress Notes (Signed)
BP 99/65 (BP Location: Left Arm, Patient Position: Lying right side)   Pulse (!) 103   Temp 98.4 F (36.9 C) (Oral)   Ht 5\' 3"  (1.6 m)   Wt 129 lb (58.5 kg)   SpO2 98%   BMI 22.85 kg/m    Subjective:    Patient ID: Cassandra Duarte, female    DOB: 1985-07-25, 33 y.o.   MRN: [id number]  HPI: Cassandra Duarte is a 33 y.o. female  Chief Complaint  Patient presents with  . Leg Pain    left leg. pt states she fell two days ago and went to [facility]. given prednisone and pain medication   Fell onto concrete several days ago onto left hip. Went to [facility] and was given prednisone for sciatica with no relief. Also given hydrocodone which she has been taking every 4-6 hours. Has flexeril and gabapentin at home but does not take them due to side effects. Numbness, pins and needles down to toes on the left leg. Is to f/u with orthopedics if not improving and discuss next steps. Missing class because she cannot sit for any length of time.   Nausea, right sided abdominal pain, continued weight loss. Eating, but not eating much because of the pain. Needing to call GI to schedule a consultation - states she's had a lot going on so did not schedule yet when they called.   Relevant past medical, surgical, family and social history reviewed and updated as indicated. Interim medical history since our last visit reviewed. Allergies and medications reviewed and updated.  Review of Systems  Per HPI unless specifically indicated above     Objective:    BP 99/65 (BP Location: Left Arm, Patient Position: Lying right side)   Pulse (!) 103   Temp 98.4 F (36.9 C) (Oral)   Ht 5\' 3"  (1.6 m)   Wt 129 lb (58.5 kg)   SpO2 98%   BMI 22.85 kg/m   Wt Readings from Last 3 Encounters:  02/23/18 129 lb (58.5 kg)  01/31/18 131 lb 4.8 oz (59.6 kg)  01/05/18 135 lb (61.2 kg)    Physical Exam Vitals signs and nursing note reviewed.  Constitutional:      Appearance: Normal  appearance.     Comments: Appears to be in pain, laying on right side on exam table  HENT:     Head: Atraumatic.  Eyes:     Extraocular Movements: Extraocular movements intact.     Conjunctiva/sclera: Conjunctivae normal.  Neck:     Musculoskeletal: Normal range of motion and neck supple.  Cardiovascular:     Rate and Rhythm: Normal rate and regular rhythm.     Heart sounds: Normal heart sounds.  Pulmonary:     Effort: Pulmonary effort is normal.     Breath sounds: Normal breath sounds.  Musculoskeletal: Normal range of motion.        General: Tenderness (left hip and lumbar paraspinal ttp) present.     Comments: Unable to determine SLR test results as pt is in severe pain regardless of motion  Skin:    General: Skin is warm and dry.  Neurological:     Mental Status: She is alert and oriented to person, place, and time.  Psychiatric:        Mood and Affect: Mood normal.        Thought Content: Thought content normal.        Judgment: Judgment normal.     Results for  orders placed or performed in visit on 01/31/18  Tissue Transglutaminase Abs,IgG,IgA  Result Value Ref Range   Transglutaminase IgA <2 0 - 3 U/mL   Tissue Transglut Ab <2 0 - 5 U/mL      Assessment & Plan:   Problem List Items Addressed This Visit    None    Visit Diagnoses    Left sided sciatica    -  Primary   Complete prednisone taper, take prn flexeril, gabapentin, epsom salt baths, stretches. Cont hydrocodone prn for pain. F/u with [facility]   Relevant Medications   cyclobenzaprine (FLEXERIL) 10 MG tablet   Nausea       Carafate and zofran sent for prn use. Follow up with GI as soon as possible. Stop adderall for several weeks to r/o side effect       Follow up plan: Return for as scheduled.

## 2018-03-01 ENCOUNTER — Ambulatory Visit: Payer: Medicaid Other | Admitting: Family Medicine

## 2018-03-01 ENCOUNTER — Encounter: Payer: Self-pay | Admitting: Family Medicine

## 2018-03-01 VITALS — BP 124/84 | HR 100 | Temp 98.8°F | Wt 130.0 lb

## 2018-03-01 DIAGNOSIS — L659 Nonscarring hair loss, unspecified: Secondary | ICD-10-CM

## 2018-03-01 DIAGNOSIS — Z791 Long term (current) use of non-steroidal anti-inflammatories (NSAID): Secondary | ICD-10-CM | POA: Diagnosis not present

## 2018-03-01 DIAGNOSIS — N920 Excessive and frequent menstruation with regular cycle: Secondary | ICD-10-CM | POA: Diagnosis not present

## 2018-03-01 DIAGNOSIS — R5383 Other fatigue: Secondary | ICD-10-CM

## 2018-03-01 NOTE — Progress Notes (Signed)
BP 124/84   Pulse 100   Temp 98.8 F (37.1 C) (Oral)   Wt 130 lb (59 kg)   LMP 02/16/2018   SpO2 100%   BMI 23.03 kg/m    Subjective:    Patient ID: Cassandra Duarte, female    DOB: 1985/08/28, 33 y.o.   MRN: [id number]  HPI: Cassandra Duarte is a 33 y.o. female  Chief Complaint  Patient presents with  . 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 Would like second opinion by OBGYN   Here today requesting a second opinion GYN referral. states she has been researching something called "post tubal ligation syndrome" that she heard about in conversation with another woman and believes this may be a cause of a multitude of sxs she's been having the past few years since her tubal ligation in 2017. Having hair loss, weight loss, painful heavy periods lasting 2.5 weeks at a time, mood and libido issues. Seeing [facility] currently but states they do not believe in that syndrome and recommended against a tubal reversal as tx, also stating that her sxs were likely not all related.    She also is requesting a check on her kidney function as she's had to take lots of NSAIDs lately for cramps.   Past Medical History:  Diagnosis Date  . Family history of adverse reaction to anesthesia    dad-n/v  . History of kidney stones    h/o  . Vaginal Pap smear, abnormal 2012   REPEAT AND WAS NORMAL   Social History   Socioeconomic History  . Marital status: Single    Spouse name: Not on file  . Number of children: Not on file  . Years of education: Not on file  . Highest education level: Not on file  Occupational History  . Not on file  Social Needs  . Financial resource strain: Not on file  . Food insecurity:    Worry: Not on file    Inability: Not on file  . Transportation needs:    Medical: Not on file    Non-medical: Not on file  Tobacco Use  . Smoking status: Former Smoker    Packs/day: 0.50    Years: 4.00    Pack years: 2.00    Types:  Cigarettes    Last attempt to quit: 04/02/2014    Years since quitting: 3.9  . Smokeless tobacco: Never Used  Substance and Sexual Activity  . Alcohol use: No    Alcohol/week: 0.0 standard drinks  . Drug use: No  . Sexual activity: Yes    Birth control/protection: None, Surgical  Lifestyle  . Physical activity:    Days per week: Not on file    Minutes per session: Not on file  . Stress: Not on file  Relationships  . Social connections:    Talks on phone: Not on file    Gets together: Not on file    Attends religious service: Not on file    Active member of club or organization: Not on file    Attends meetings of clubs or organizations: Not on file    Relationship status: Not on file  . Intimate partner violence:    Fear of current or ex partner: Not on file    Emotionally abused: Not on file    Physically abused: Not on file    Forced sexual activity: Not on file  Other Topics Concern  . Not on file  Social History Narrative  . Not on file    Relevant past medical, surgical, family and social history reviewed and updated as indicated. Interim medical history since our last visit reviewed. Allergies and medications reviewed and updated.  Review of Systems  Per HPI unless specifically indicated above     Objective:    BP 124/84   Pulse 100   Temp 98.8 F (37.1 C) (Oral)   Wt 130 lb (59 kg)   LMP 02/16/2018   SpO2 100%   BMI 23.03 kg/m   Wt Readings from Last 3 Encounters:  03/03/18 128 lb 14.4 oz (58.5 kg)  03/01/18 130 lb (59 kg)  02/23/18 129 lb (58.5 kg)    Physical Exam Vitals signs and nursing note reviewed.  Constitutional:      Appearance: Normal appearance. She is not ill-appearing.  HENT:     Head: Atraumatic.  Eyes:     Extraocular Movements: Extraocular movements intact.     Conjunctiva/sclera: Conjunctivae normal.  Neck:     Musculoskeletal: Normal range of motion and neck supple.  Cardiovascular:     Rate and Rhythm: Normal rate and  regular rhythm.     Heart sounds: Normal heart sounds.  Pulmonary:     Effort: Pulmonary effort is normal.     Breath sounds: Normal breath sounds.  Musculoskeletal: Normal range of motion.  Skin:    General: Skin is warm and dry.  Neurological:     Mental Status: She is alert and oriented to person, place, and time.  Psychiatric:        Mood and Affect: Mood normal.        Thought Content: Thought content normal.        Judgment: Judgment normal.     Results for orders placed or performed in visit on 03/01/18  TSH  Result Value Ref Range   TSH 2.870 0.450 - 4.500 uIU/mL  CBC with Differential/Platelet  Result Value Ref Range   WBC 7.1 3.4 - 10.8 x10E3/uL   RBC 3.83 3.77 - 5.28 x10E6/uL   Hemoglobin 12.0 11.1 - 15.9 g/dL   Hematocrit 33.7 (L) 34.0 - 46.6 %   MCV 88 79 - 97 fL   MCH 31.3 26.6 - 33.0 pg   MCHC 35.6 31.5 - 35.7 g/dL   RDW 12.6 11.7 - 15.4 %   Platelets 184 150 - 450 x10E3/uL   Neutrophils 55 Not Estab. %   Lymphs 34 Not Estab. %   Monocytes 4 Not Estab. %   Eos 6 Not Estab. %   Basos 1 Not Estab. %   Neutrophils Absolute 4.0 1.4 - 7.0 x10E3/uL   Lymphocytes Absolute 2.4 0.7 - 3.1 x10E3/uL   Monocytes Absolute 0.3 0.1 - 0.9 x10E3/uL   EOS (ABSOLUTE) 0.4 0.0 - 0.4 x10E3/uL   Basophils Absolute 0.0 0.0 - 0.2 x10E3/uL   Immature Granulocytes 0 Not Estab. %   Immature Grans (Abs) 0.0 0.0 - 0.1 [id number]/NL  Basic Metabolic Panel (BMET)  Result Value Ref Range   Glucose 76 65 - 99 mg/dL   BUN 14 6 - 20 mg/dL   Creatinine, Ser 0.54 (L) 0.57 - 1.00 mg/dL   GFR calc non Af Amer 125 >59 mL/min/1.73   GFR calc Af Amer 144 >59 mL/min/1.73   BUN/Creatinine Ratio 26 (H) 9 - 23   Sodium 140 134 - 144 mmol/L   Potassium 3.7 3.5 - 5.2 mmol/L   Chloride 104 96 - 106 mmol/L   CO2 22  20 - 29 mmol/L   Calcium 8.7 8.7 - 10.2 mg/dL      Assessment & Plan:   Problem List Items Addressed This Visit      Other   Menorrhagia with regular cycle - Primary    Normal labs  back in May, but will recheck TSH, CBC to r/o some basic causes of some of her sxs and make sure her heavy cycles are not causing anemia. Referral placed to GYN for a second opinion per patient request.       Relevant Orders   Ambulatory referral to Gynecology    Other Visit Diagnoses    Hair loss       Relevant Orders   TSH (Completed)   CBC with Differential/Platelet (Completed)   Fatigue, unspecified type       Relevant Orders   TSH (Completed)   CBC with Differential/Platelet (Completed)   Patient takes NSAID (non-steroid anti-inflammatory drug)       Pt requested check of kidney function given frequent NSAID use lately. Await bmp   Relevant Orders   Basic Metabolic Panel (BMET) (Completed)       Follow up plan: Return for as scheduled.

## 2018-03-02 LAB — BASIC METABOLIC PANEL
BUN/Creatinine Ratio: 26 — ABNORMAL HIGH (ref 9–23)
BUN: 14 mg/dL (ref 6–20)
CO2: 22 mmol/L (ref 20–29)
Calcium: 8.7 mg/dL (ref 8.7–10.2)
Chloride: 104 mmol/L (ref 96–106)
Creatinine, Ser: 0.54 mg/dL — ABNORMAL LOW (ref 0.57–1.00)
GFR calc Af Amer: 144 mL/min/{1.73_m2} (ref >59)
GFR calc non Af Amer: 125 mL/min/{1.73_m2} (ref >59)
GLUCOSE: 76 mg/dL (ref 65–99)
Potassium: 3.7 mmol/L (ref 3.5–5.2)
SODIUM: 140 mmol/L (ref 134–144)

## 2018-03-02 LAB — CBC WITH DIFFERENTIAL/PLATELET
Basophils Absolute: 0 10*3/uL (ref 0.0–0.2)
Basos: 1 %
EOS (ABSOLUTE): 0.4 10*3/uL (ref 0.0–0.4)
Eos: 6 %
Hematocrit: 33.7 % — ABNORMAL LOW (ref 34.0–46.6)
Hemoglobin: 12 g/dL (ref 11.1–15.9)
Immature Grans (Abs): 0 10*3/uL (ref 0.0–0.1)
Immature Granulocytes: 0 %
LYMPHS ABS: 2.4 10*3/uL (ref 0.7–3.1)
Lymphs: 34 %
MCH: 31.3 pg (ref 26.6–33.0)
MCHC: 35.6 g/dL (ref 31.5–35.7)
MCV: 88 fL (ref 79–97)
MONOCYTES: 4 %
Monocytes Absolute: 0.3 10*3/uL (ref 0.1–0.9)
Neutrophils Absolute: 4 10*3/uL (ref 1.4–7.0)
Neutrophils: 55 %
Platelets: 184 10*3/uL (ref 150–450)
RBC: 3.83 x10E6/uL (ref 3.77–5.28)
RDW: 12.6 % (ref 11.7–15.4)
WBC: 7.1 10*3/uL (ref 3.4–10.8)

## 2018-03-02 LAB — TSH: TSH: 2.87 u[IU]/mL (ref 0.450–4.500)

## 2018-03-03 ENCOUNTER — Encounter: Payer: Self-pay | Admitting: Obstetrics and Gynecology

## 2018-03-03 ENCOUNTER — Encounter: Payer: Self-pay | Admitting: Family Medicine

## 2018-03-03 ENCOUNTER — Telehealth: Payer: Self-pay | Admitting: Family Medicine

## 2018-03-03 ENCOUNTER — Ambulatory Visit: Payer: Medicaid Other | Admitting: Obstetrics and Gynecology

## 2018-03-03 VITALS — BP 137/92 | HR 111 | Ht 63.0 in | Wt 128.9 lb

## 2018-03-03 DIAGNOSIS — R102 Pelvic and perineal pain: Secondary | ICD-10-CM | POA: Diagnosis not present

## 2018-03-03 NOTE — Telephone Encounter (Signed)
Fill dates have been the 18th per record review. She will need to wait until 18th

## 2018-03-03 NOTE — Progress Notes (Signed)
HPI:      [name] is a 33 y.o. [id number] who LMP was Patient's last menstrual period was 02/16/2018.  Subjective:   She presents today with multiple issues which are seemingly unrelated.  She says that she has "done a lot of research".  She reports pelvic pain, lack of blood flow to her ovaries causing hormonal imbalances, recurrent headaches since her tubal ligation, the diagnosis of ADHD since her tubal ligation, endometriosis, lethargy and weight loss, losing her hair including extremity hair, lack of desire for intercourse, tubal ligation ablation syndrome, etc.  Patient has never had an endometrial ablation. She has explained to me that she found a doctor in [place] that is willing to do a tubal reversal for her so that her symptoms all go away.  She does not want any more children.  She is convinced that " all the other doctors I have seen" cannot find out what is wrong with me and he is the only one willing to do this surgery.    Hx: The following portions of the patient's history were reviewed and updated as appropriate:             She  has a past medical history of Family history of adverse reaction to anesthesia, History of kidney stones, and Vaginal Pap smear, abnormal (2012). She does not have any pertinent problems on file. She  has a past surgical history that includes Dilation and curettage of uterus (2014); Tubal ligation (Bilateral, 02/21/2015); laparoscopy (N/A, 06/20/2017); and Laparoscopic lysis of adhesions (06/20/2017). Her family history includes Breast cancer in her paternal aunt; COPD in her maternal grandmother; Cancer in her maternal grandmother; Diabetes in her maternal grandfather and paternal aunt. She  reports that she quit smoking about 3 years ago. Her smoking use included cigarettes. She has a 2.00 pack-year smoking history. She has never used smokeless tobacco. She reports that she does not drink alcohol or use drugs. She has a current medication list  which includes the following prescription(s): amphetamine-dextroamphetamine, multiple vitamin, and vitamin d. She is allergic to magnesium-containing compounds; toradol [ketorolac tromethamine]; tamiflu [oseltamivir phosphate]; and tramadol.       Review of Systems:  Review of Systems  Constitutional: Denied constitutional symptoms, night sweats, recent illness, fatigue, fever, insomnia and weight loss.  Eyes: Denied eye symptoms, eye pain, photophobia, vision change and visual disturbance.  Ears/Nose/Throat/Neck: Denied ear, nose, throat or neck symptoms, hearing loss, nasal discharge, sinus congestion and sore throat.  Cardiovascular: Denied cardiovascular symptoms, arrhythmia, chest pain/pressure, edema, exercise intolerance, orthopnea and palpitations.  Respiratory: Denied pulmonary symptoms, asthma, pleuritic pain, productive sputum, cough, dyspnea and wheezing.  Gastrointestinal: Denied, gastro-esophageal reflux, melena, nausea and vomiting.  Genitourinary: See HPI for additional information.  Musculoskeletal: Denied musculoskeletal symptoms, stiffness, swelling, muscle weakness and myalgia.  Dermatologic: Denied dermatology symptoms, rash and scar.  Neurologic: Denied neurology symptoms, dizziness, headache, neck pain and syncope.  Psychiatric: Denied psychiatric symptoms, anxiety and depression.  Endocrine: Denied endocrine symptoms including hot flashes and night sweats.   Meds:   Current Outpatient Medications on File Prior to Visit  Medication Sig Dispense Refill  . [START ON 04/05/2018] amphetamine-dextroamphetamine (ADDERALL) 20 MG tablet Take 1 tablet (20 mg total) by mouth daily. 30 tablet 0  . Multiple Vitamin (MULTIVITAMINS PO) Take by mouth daily.    Marland Kitchen VITAMIN D PO Take by mouth once a week.     No current facility-administered medications on file prior to visit.     Objective:  Vitals:   03/03/18 1025  BP: (!) 137/92  Pulse: (!) 111                 Assessment:    [id number] Patient Active Problem List   Diagnosis Date Noted  . Controlled substance agreement signed 11/29/2017  . Dysuria 10/10/2017  . Status post laparoscopy with adhesio lysis 07/13/2017  . Chronic pain syndrome 06/29/2017  . 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 Myofascial pain 06/29/2017  . ADD (attention deficit disorder) 05/26/2017  . Menorrhagia with regular cycle 05/03/2017  . Dysmenorrhea 05/03/2017  . Overweight 10/01/2015  . Lactational amenorrhea 10/01/2015  . Family history of breast cancer 10/01/2015  . 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 Pelvic pain in female     With numerous other symptoms-See HPI for additional information.   Plan:            1.  I have discussed her symptoms in detail and have reviewed the findings at her last surgery as well of some strategies for treatment of dysmenorrhea and pelvic pain.  Patient has declined this and seems directed only to "see another doctor for another opinion". Orders No orders of the defined types were placed in this encounter.   No orders of the defined types were placed in this encounter.     F/U  No follow-ups on file. I spent 22 minutes involved in the care of this patient of which greater than 50% was spent discussing please see above HPI.  Continue his questions systematically addressed.  Finis Bud, M.D. 03/03/2018 11:04 AM

## 2018-03-03 NOTE — Progress Notes (Signed)
Pt stated that she has been having heavy cycles, bleeding and cramping. Pt stated that after her tubal she has had issues with staying on task, school work and focusing. Pt stated that she is having a lot of physical issues since the surgery.

## 2018-03-03 NOTE — Telephone Encounter (Signed)
Copied from [place] [phone]. Topic: Quick Communication - Rx Refill/Question >> Mar 03, 2018  1:57 PM Reyne Dumas L wrote: Medication: amphetamine-dextroamphetamine (ADDERALL) 20 MG tablet  Pt states that pharmacy will not release medication until the 18th, but pt needs to pick up on the 16th because this is the day that she gets her medication  [place], [place] [phone] (Phone) [phone] (Fax)

## 2018-03-03 NOTE — Telephone Encounter (Signed)
Chart signed in error by agent.  Please review.

## 2018-03-03 NOTE — Telephone Encounter (Signed)
Called patient to let her know and she states she had discussed this with you at her last visit.  She said you had her CMA call to pharmacy last time and changed it to the 16th. I told her I would send you a message but she hung up on me.    Thank you

## 2018-03-04 ENCOUNTER — Encounter: Payer: Self-pay | Admitting: Emergency Medicine

## 2018-03-04 DIAGNOSIS — R5383 Other fatigue: Secondary | ICD-10-CM | POA: Diagnosis not present

## 2018-03-04 DIAGNOSIS — Z5321 Procedure and treatment not carried out due to patient leaving prior to being seen by health care provider: Secondary | ICD-10-CM | POA: Diagnosis not present

## 2018-03-04 DIAGNOSIS — R1030 Lower abdominal pain, unspecified: Secondary | ICD-10-CM | POA: Insufficient documentation

## 2018-03-04 DIAGNOSIS — M549 Dorsalgia, unspecified: Secondary | ICD-10-CM | POA: Diagnosis not present

## 2018-03-04 DIAGNOSIS — Z3200 Encounter for pregnancy test, result unknown: Secondary | ICD-10-CM | POA: Insufficient documentation

## 2018-03-04 NOTE — ED Triage Notes (Signed)
Pt reports she has had her tubes tied 3 years ago and for the last week she has felt "pregnant." Pt has had fatigue, lower groin and back pain. Pt to ED due to concern of pregnancy.

## 2018-03-05 ENCOUNTER — Emergency Department
Admission: EM | Admit: 2018-03-05 | Discharge: 2018-03-05 | Disposition: A | Discharge status: Home / Self Care | Payer: Medicaid Other | Attending: Emergency Medicine | Admitting: Emergency Medicine

## 2018-03-05 LAB — URINALYSIS, COMPLETE (UACMP) WITH MICROSCOPIC
Bacteria, UA: NONE SEEN
Bilirubin Urine: NEGATIVE
GLUCOSE, UA: NEGATIVE mg/dL
HGB URINE DIPSTICK: NEGATIVE
Ketones, ur: NEGATIVE mg/dL
LEUKOCYTE UA: NEGATIVE
NITRITE: NEGATIVE
Protein, ur: NEGATIVE mg/dL
Specific Gravity, Urine: 1.021 (ref 1.005–1.030)
pH: 7 (ref 5.0–8.0)

## 2018-03-05 NOTE — Assessment & Plan Note (Signed)
Normal labs back in May, but will recheck TSH, CBC to r/o some basic causes of some of her sxs and make sure her heavy cycles are not causing anemia. Referral placed to GYN for a second opinion per patient request.

## 2018-03-06 NOTE — Telephone Encounter (Signed)
Responded to pt via mychart

## 2018-03-06 NOTE — Telephone Encounter (Signed)
Please see front desk comments on prior telephone encounter.

## 2018-03-07 ENCOUNTER — Emergency Department
Admission: EM | Admit: 2018-03-07 | Discharge: 2018-03-07 | Disposition: A | Discharge status: Home / Self Care | Payer: Medicaid Other

## 2018-03-07 NOTE — ED Notes (Signed)
Pt reports to this RN that she does not want to wait and needs to go get her kids

## 2018-03-08 ENCOUNTER — Emergency Department
Admission: EM | Admit: 2018-03-08 | Discharge: 2018-03-08 | Disposition: A | Discharge status: Home / Self Care | Payer: Medicaid Other | Attending: Emergency Medicine | Admitting: Emergency Medicine

## 2018-03-08 ENCOUNTER — Emergency Department: Payer: Medicaid Other

## 2018-03-08 ENCOUNTER — Encounter: Payer: Self-pay | Admitting: Emergency Medicine

## 2018-03-08 DIAGNOSIS — R102 Pelvic and perineal pain: Secondary | ICD-10-CM | POA: Diagnosis not present

## 2018-03-08 DIAGNOSIS — R1031 Right lower quadrant pain: Secondary | ICD-10-CM | POA: Diagnosis present

## 2018-03-08 DIAGNOSIS — Z87891 Personal history of nicotine dependence: Secondary | ICD-10-CM | POA: Insufficient documentation

## 2018-03-08 DIAGNOSIS — R1030 Lower abdominal pain, unspecified: Secondary | ICD-10-CM | POA: Insufficient documentation

## 2018-03-08 LAB — COMPREHENSIVE METABOLIC PANEL
ALBUMIN: 4.2 g/dL (ref 3.5–5.0)
ALT: 17 U/L (ref 0–44)
ANION GAP: 7 (ref 5–15)
AST: 19 U/L (ref 15–41)
Alkaline Phosphatase: 54 U/L (ref 38–126)
BUN: 14 mg/dL (ref 6–20)
CO2: 27 mmol/L (ref 22–32)
Calcium: 8.8 mg/dL — ABNORMAL LOW (ref 8.9–10.3)
Chloride: 104 mmol/L (ref 98–111)
Creatinine, Ser: 0.61 mg/dL (ref 0.44–1.00)
GFR calc Af Amer: >60 mL/min (ref >60)
GFR calc non Af Amer: >60 mL/min (ref >60)
Glucose, Bld: 99 mg/dL (ref 70–99)
Potassium: 3.6 mmol/L (ref 3.5–5.1)
Sodium: 138 mmol/L (ref 135–145)
Total Bilirubin: 0.5 mg/dL (ref 0.3–1.2)
Total Protein: 6.9 g/dL (ref 6.5–8.1)

## 2018-03-08 LAB — CBC WITH DIFFERENTIAL/PLATELET
Abs Immature Granulocytes: 0.02 10*3/uL (ref 0.00–0.07)
Basophils Absolute: 0.1 10*3/uL (ref 0.0–0.1)
Basophils Relative: 1 %
Eosinophils Absolute: 0.5 10*3/uL (ref 0.0–0.5)
Eosinophils Relative: 5 %
HCT: 37.6 % (ref 36.0–46.0)
Hemoglobin: 13 g/dL (ref 12.0–15.0)
Immature Granulocytes: 0 %
Lymphocytes Relative: 23 %
Lymphs Abs: 2.3 10*3/uL (ref 0.7–4.0)
MCH: 30.3 pg (ref 26.0–34.0)
MCHC: 34.6 g/dL (ref 30.0–36.0)
MCV: 87.6 fL (ref 80.0–100.0)
Monocytes Absolute: 0.5 10*3/uL (ref 0.1–1.0)
Monocytes Relative: 5 %
Neutro Abs: 6.6 10*3/uL (ref 1.7–7.7)
Neutrophils Relative %: 66 %
Platelets: 177 10*3/uL (ref 150–400)
RBC: 4.29 MIL/uL (ref 3.87–5.11)
RDW: 12.6 % (ref 11.5–15.5)
WBC: 10.1 10*3/uL (ref 4.0–10.5)
nRBC: 0 % (ref 0.0–0.2)

## 2018-03-08 LAB — URINALYSIS, COMPLETE (UACMP) WITH MICROSCOPIC
Bacteria, UA: NONE SEEN
Bilirubin Urine: NEGATIVE
Glucose, UA: NEGATIVE mg/dL
Hgb urine dipstick: NEGATIVE
Ketones, ur: NEGATIVE mg/dL
Leukocytes,Ua: NEGATIVE
Nitrite: NEGATIVE
PROTEIN: NEGATIVE mg/dL
SPECIFIC GRAVITY, URINE: 1.028 (ref 1.005–1.030)
pH: 5 (ref 5.0–8.0)

## 2018-03-08 LAB — POCT PREGNANCY, URINE: Preg Test, Ur: NEGATIVE

## 2018-03-08 LAB — WET PREP, GENITAL
Clue Cells Wet Prep HPF POC: NONE SEEN
TRICH WET PREP: NONE SEEN
Yeast Wet Prep HPF POC: NONE SEEN

## 2018-03-08 LAB — CHLAMYDIA/NGC RT PCR (ARMC ONLY)
Chlamydia Tr: NOT DETECTED
N gonorrhoeae: NOT DETECTED

## 2018-03-08 LAB — HCG, QUANTITATIVE, PREGNANCY: hCG, Beta Chain, Quant, S: <1 m[IU]/mL (ref <5)

## 2018-03-08 MED ORDER — IBUPROFEN 400 MG PO TABS: 600.0000 mg | ORAL_TABLET | Freq: Once | ORAL | Status: DC

## 2018-03-08 MED ORDER — ONDANSETRON HCL 4 MG/2ML IJ SOLN
INTRAMUSCULAR | Status: AC
  Administered 2018-03-08: 4 mg via INTRAVENOUS
  Filled 2018-03-08: qty 2

## 2018-03-08 MED ORDER — IOPAMIDOL (ISOVUE-300) INJECTION 61%
30.0000 mL | Freq: Once | INTRAVENOUS | Status: AC | PRN
  Administered 2018-03-08: 30 mL via ORAL
  Filled 2018-03-08: qty 30

## 2018-03-08 MED ORDER — MORPHINE SULFATE (PF) 4 MG/ML IV SOLN
4.0000 mg | Freq: Once | INTRAVENOUS | Status: AC
  Administered 2018-03-08: 4 mg via INTRAVENOUS
  Filled 2018-03-08: qty 1

## 2018-03-08 MED ORDER — DICYCLOMINE HCL 10 MG/ML IM SOLN
20.0000 mg | Freq: Once | INTRAMUSCULAR | Status: DC
  Filled 2018-03-08: qty 2

## 2018-03-08 MED ORDER — ONDANSETRON HCL 4 MG/2ML IJ SOLN
4.0000 mg | Freq: Once | INTRAMUSCULAR | Status: AC
  Administered 2018-03-08: 4 mg via INTRAVENOUS

## 2018-03-08 MED ORDER — IOHEXOL 300 MG/ML  SOLN
100.0000 mL | Freq: Once | INTRAMUSCULAR | Status: AC | PRN
  Administered 2018-03-08: 100 mL via INTRAVENOUS
  Filled 2018-03-08: qty 100

## 2018-03-08 NOTE — ED Provider Notes (Signed)
[facility] Emergency Department Provider Note ____________________________________________   First MD Initiated Contact with Patient 03/08/18 1132     (approximate)  I have reviewed the triage vital signs and the nursing notes.   HISTORY  Chief Complaint Vaginal Bleeding    HPI Cassandra Duarte is a 33 y.o. female with PMH as noted below who presents with right lower quadrant abdominal pain, described as crampy, present for the last several days, initially associated with vaginal bleeding.  The patient states that she initially had bleeding 1 week ago which was a day late for her period and states that it lasted shorter time than her normal period.  She reports nausea but no vomiting.  She denies any urinary symptoms.  She has no vaginal discharge.  The patient reports that a few months ago she had more right mid and upper quadrant abdominal pain and had CT which was negative for appendicitis.  She also states that she had a positive pregnancy test at home, although another one the same day was negative.   Past Medical History:  Diagnosis Date  . Family history of adverse reaction to anesthesia    dad-n/v  . History of kidney stones    h/o  . Vaginal Pap smear, abnormal 2012   REPEAT AND WAS NORMAL    Patient Active Problem List   Diagnosis Date Noted  . Controlled substance agreement signed 11/29/2017  . Dysuria 10/10/2017  . Status post laparoscopy with adhesio lysis 07/13/2017  . Chronic pain syndrome 06/29/2017  . 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 Myofascial pain 06/29/2017  . ADD (attention deficit disorder) 05/26/2017  . Menorrhagia with regular cycle 05/03/2017  . Dysmenorrhea 05/03/2017  . Overweight 10/01/2015  . Lactational amenorrhea 10/01/2015  . Family history of breast cancer 10/01/2015  . 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 DILATION  AND CURETTAGE OF UTERUS  2014   WS  . LAPAROSCOPIC LYSIS OF ADHESIONS  06/20/2017   Procedure: LAPAROSCOPIC LYSIS OF ADHESIONS;  Surgeon: Brayton Mars, MD;  Location: [facility] ORS;  Service: Gynecology;;  . LAPAROSCOPY N/A 06/20/2017   Procedure: OPERATIVE DIAGNOSTIC WITH BIOPSIES;  Surgeon: Brayton Mars, MD;  Location: [facility] ORS;  Service: Gynecology;  Laterality: N/A;  . TUBAL LIGATION Bilateral 02/21/2015   Procedure: BILATERAL TUBAL LIGATION;  Surgeon: Brayton Mars, MD;  Location: [facility] ORS;  Service: Gynecology;  Laterality: Bilateral;    Prior to Admission medications   Medication Sig Start Date End Date Taking? Authorizing Provider  amphetamine-dextroamphetamine (ADDERALL) 20 MG tablet Take 1 tablet (20 mg total) by mouth daily. 04/05/18   Volney American, PA-C  Multiple Vitamin (MULTIVITAMINS PO) Take by mouth daily.    [provider]  VITAMIN D PO Take by mouth once a week.    [provider]    Allergies Magnesium-containing compounds; Toradol [ketorolac tromethamine]; Tamiflu [oseltamivir phosphate]; and Tramadol  Family History  Problem Relation Age of Onset  . Breast cancer Paternal Aunt   . Diabetes Paternal Aunt   . Diabetes Maternal Grandfather   . COPD Maternal Grandmother   . Cancer Maternal Grandmother        lung  . Colon cancer Neg Hx   . Ovarian cancer Neg Hx   . Heart disease Neg Hx     Social History Social History   Tobacco Use  . Smoking status: Former Smoker  Packs/day: 0.50    Years: 4.00    Pack years: 2.00    Types: Cigarettes    Last attempt to quit: 04/02/2014    Years since quitting: 3.9  . Smokeless tobacco: Never Used  Substance Use Topics  . Alcohol use: No    Alcohol/week: 0.0 standard drinks  . Drug use: No    Review of Systems  Constitutional: No fever. Eyes: No redness ENT: No sore throat. Cardiovascular: Denies chest pain. Respiratory: Denies shortness of  breath. Gastrointestinal: Positive for nausea and vomiting. Genitourinary: Negative for dysuria.  Positive for resolved vaginal bleeding. Musculoskeletal: Negative for back pain. Skin: Negative for rash. Neurological: Negative for headache.   ____________________________________________   PHYSICAL EXAM:  VITAL SIGNS: ED Triage Vitals  Enc Vitals Group     BP 03/08/18 0952 131/86     Pulse Rate 03/08/18 0952 (!) 109     Resp 03/08/18 0952 16     Temp 03/08/18 0952 98.1 F (36.7 C)     Temp Source 03/08/18 0952 Oral     SpO2 03/08/18 0952 99 %     Weight 03/08/18 1004 128 lb (58.1 kg)     Height 03/08/18 1004 5\' 3"  (1.6 m)     Head Circumference --      Peak Flow --      Pain Score 03/08/18 1003 6     Pain Loc --      Pain Edu? --      Excl. in [place]? --     Constitutional: Alert and oriented. Well appearing and in no acute distress. Eyes: Conjunctivae are normal.  Head: Atraumatic. Nose: No congestion/rhinnorhea. Mouth/Throat: Mucous membranes are moist.   Neck: Normal range of motion.  Cardiovascular: Good peripheral circulation. Respiratory: Normal respiratory effort.  No retractions.  Gastrointestinal: Soft with moderate right lower quadrant tenderness.  No distention.  Genitourinary: No flank tenderness.  No vaginal bleeding or discharge.  No significant CMT.  Mild bilateral adnexal tenderness. Musculoskeletal: Extremities warm and well perfused.  Neurologic:  Normal speech and language. No gross focal neurologic deficits are appreciated.  Skin:  Skin is warm and dry. No rash noted. Psychiatric: Mood and affect are normal. Speech and behavior are normal.  ____________________________________________   LABS (all labs ordered are listed, but only abnormal results are displayed)  Labs Reviewed  WET PREP, GENITAL - Abnormal; Notable for the following components:      Result Value   WBC, Wet Prep HPF POC MODERATE (*)    All other components within normal limits   COMPREHENSIVE METABOLIC PANEL - Abnormal; Notable for the following components:   Calcium 8.8 (*)    All other components within normal limits  URINALYSIS, COMPLETE (UACMP) WITH MICROSCOPIC - Abnormal; Notable for the following components:   Color, Urine YELLOW (*)    APPearance CLEAR (*)    All other components within normal limits  CHLAMYDIA/NGC RT PCR ([facility] ONLY)  CBC WITH DIFFERENTIAL/PLATELET  HCG, QUANTITATIVE, PREGNANCY  POCT PREGNANCY, URINE   ____________________________________________  EKG   ____________________________________________  RADIOLOGY  US pelvis: No acute abnormalities CT abdomen: No acute abnormalities  ____________________________________________   PROCEDURES  Procedure(s) performed: No  Procedures  Critical Care performed: No ____________________________________________   INITIAL IMPRESSION / ASSESSMENT AND PLAN / ED COURSE  Pertinent labs & imaging results that were available during my care of the patient were reviewed by me and considered in my medical decision making (see chart for details).  33 year old female with PMH as  noted above including dysmenorrhea and kidney stones presents with right lower quadrant abdominal pain over the last several days, as well as resolved vaginal bleeding which was atypical for her period.  She also had a positive pregnancy test at home although another was negative.  The patient states that she had more right upper quadrant abdominal pain in December and had a CT scan at that time to rule out appendicitis, which was negative.  I reviewed the past medical records in epic and confirmed a CT in December 2019 which was essentially normal.  On exam today the patient is well-appearing and her vital signs are normal.  However, she does have significant tenderness in the right lower quadrant.  Lab work-up obtained from triage is negative.  Ultrasound of the pelvis is also negative.  Differential  includes dysmenorrhea, endometriosis or other nonemergent gynecologic etiology, or ureteral stone. Given the relatively significant tenderness in the location of the pain I am also concerned for appendicitis - although the patient had a CT recently given the somewhat acute worsening of the pain and the exam, she will need imaging today.  The ultrasound shows no evidence of torsion or any large ovarian cyst or mass, and the patient's well appearance and clinical symptoms are also not consistent with it.  We will obtain urinalysis, GC/CT and wet prep, CT abdomen, and reassess.  ----------------------------------------- 2:18 PM on 03/08/2018 -----------------------------------------  CT shows no acute abnormalities.  GC/CT is still pending.  She has had some recurrence of her pain.  I reviewed her chart further; the patient recently saw Dr. Amalia Hailey from ob/gyn for multiple complaints including pelvic pain, endometriosis and other symptoms staring from when she had tubal ligation in 2017.  She wanted to pursue reversal with a doctor in [place], and declined Dr. Rebekah Chesterfield offered treatments for dysmenorrhea and pelvic pain.    I suspect that some of her current presentation is due to exacerbation of these chronic symptoms.  There could also be an acute intestinal component such as mild enteritis - I will try bentyl.  Anticipate d/c home if the vaginal swab is negative.   ----------------------------------------- 2:43 PM on 03/08/2018 -----------------------------------------  The patient declined Bentyl.  She states that she wants to go home.  On reassessment, she is tearful.  She states that she is upset because she has been to multiple doctors and no one can seem to figure out what is going on with her.  She states she came to the hospital today because she thought something could seriously be wrong.  I attempted to reassure her, again discussed the results of the work-up with her.  She does not want to  wait for the results of the GC CT although based on her exam I think that cervicitis or PID are unlikely.  I encouraged her to continue her outpatient follow-up.  She no longer wants to see her current OB/GYN so I provided information for a few other providers in the area.  Return precautions given, the patient expresses understanding.  She is stable for discharge at this time. ____________________________________________   FINAL CLINICAL IMPRESSION(S) / ED DIAGNOSES  Final diagnoses:  Pelvic pain  Lower abdominal pain      NEW MEDICATIONS STARTED DURING THIS VISIT:  New Prescriptions   No medications on file     Note:  This document was prepared using Dragon voice recognition software and may include unintentional dictation errors.    Arta Silence, MD 03/08/18 1444

## 2018-03-08 NOTE — ED Notes (Signed)
Patient transported to CT 

## 2018-03-08 NOTE — ED Notes (Signed)
Pt refuses d/c VS. Pt waiting in lobby for ride

## 2018-03-08 NOTE — Discharge Instructions (Addendum)
We have given referral information for a few other ob/gyns in the area if you want to change where you get care.  Return to the ER for new, worsening or persistent severe pain, bleeding, vomiting, fever or any other new or worsening symptoms that concern you.

## 2018-03-08 NOTE — ED Triage Notes (Signed)
Pt reports she has been here twice and left because the wait was too long so she thought she would come again today. Pt states that she had a tubal ligation 3 years ago ago but still had her period but it was a day late. Pt reports she has taken pregnancy tests 6 times and everytime she does the one in the am will be negative and the one on the evening will be positive. Pt also reports cramping to her right side abd.

## 2018-03-08 NOTE — ED Notes (Signed)
Pt refusing bentyl, states she is ready to leave. MD Siadecki aware and at bedside

## 2018-03-09 ENCOUNTER — Telehealth: Payer: Self-pay | Admitting: Family Medicine

## 2018-03-09 NOTE — Telephone Encounter (Signed)
Can you please re-route her referral to GYN accordingly and let [facility] know she will not be scheduling?

## 2018-03-09 NOTE — Telephone Encounter (Signed)
Patient states she went to E.R yesterday w/ continued symptoms. Provider there she really felt comfortable and reassured by and he recommended [facility]. She does prefer if that referral is changed.

## 2018-03-09 NOTE — Telephone Encounter (Signed)
Does she not want to go to [facility] now? Just last week she came in and requested to be referred to [facility] and this referral is already in process. Please call to clarify this before we place another referral   Copied from [place] [id number]. Topic: Referral - Request for Referral >> Mar 08, 2018  4:45 PM Windy Kalata wrote: Has patient seen PCP for this complaint? Yes *If NO, is insurance requiring patient see PCP for this issue before PCP can refer them? Referral for which specialty: OB-GYN Preferred provider/office: [facility], Dr. Vikki Ports Ward Reason for referral: Lower pelvic pain and spotting  Fax is [phone]

## 2018-03-09 NOTE — Telephone Encounter (Signed)
See below - Cassandra Duarte called earlier to confirm

## 2018-03-14 DIAGNOSIS — R1011 Right upper quadrant pain: Secondary | ICD-10-CM | POA: Diagnosis not present

## 2018-03-14 DIAGNOSIS — R300 Dysuria: Secondary | ICD-10-CM | POA: Diagnosis not present

## 2018-04-27 NOTE — Telephone Encounter (Signed)
Patient called back to confirm that she is ok with doing a virtual visit if that is possible. Please advise

## 2018-04-28 NOTE — Telephone Encounter (Signed)
Someone will call you 10-15 mins prior to your appointment Cassandra Duarte. Your appointment is scheduled 4/14 @ 8:30 with Apolonio Schneiders.   Thank You

## 2018-05-02 ENCOUNTER — Other Ambulatory Visit: Payer: Self-pay

## 2018-05-02 ENCOUNTER — Encounter: Payer: Self-pay | Admitting: Family Medicine

## 2018-05-02 ENCOUNTER — Ambulatory Visit (INDEPENDENT_AMBULATORY_CARE_PROVIDER_SITE_OTHER): Payer: Medicaid Other | Admitting: Family Medicine

## 2018-05-02 DIAGNOSIS — R1011 Right upper quadrant pain: Secondary | ICD-10-CM | POA: Diagnosis not present

## 2018-05-02 DIAGNOSIS — F988 Other specified behavioral and emotional disorders with onset usually occurring in childhood and adolescence: Secondary | ICD-10-CM | POA: Diagnosis not present

## 2018-05-02 MED ORDER — AMPHETAMINE-DEXTROAMPHETAMINE 20 MG PO TABS: 20.0000 mg | ORAL_TABLET | Freq: Every day | ORAL | 0 refills | Status: DC

## 2018-05-02 MED ORDER — ONDANSETRON 4 MG PO TBDP: 4.0000 mg | ORAL_TABLET | Freq: Three times a day (TID) | ORAL | 0 refills | Status: DC | PRN

## 2018-05-02 MED ORDER — AMPHETAMINE-DEXTROAMPHETAMINE 5 MG PO TABS: 5.0000 mg | ORAL_TABLET | Freq: Every day | ORAL | 0 refills | Status: DC

## 2018-05-02 NOTE — Progress Notes (Signed)
There were no vitals taken for this visit.   Subjective:    Patient ID: Cassandra Duarte, female    DOB: [birth year], 33 y.o.   MRN: [id number]  HPI: Cassandra Duarte is a 33 y.o. female  Chief Complaint  Patient presents with  . ADD    . This visit was completed via WebEx due to the restrictions of the COVID-19 pandemic. All issues as above were discussed and addressed. Physical exam was done as above through visual confirmation on WebEx. If it was felt that the patient should be evaluated in the office, they were directed there. The patient verbally consented to this visit. . Location of the patient: home . Location of the provider: home . Those involved with this call:  . Provider: Merrie Roof, PA-C . CMA: Yvonna Alanis, [place] . Front Desk/Registration: Jill Side  . Time spent on call: 24 minutes with patient face to face via video conference. More than 50% of this time was spent in counseling and coordination of care. 5 minutes total spent in review of patient's record and preparation of their chart.  Following up today for ADD. Currently on adderall 20 mg daily. Doing very well on it, states it helps quite a bit with her focus on schoolwork and tasks at home but notes it wears off around 2:30 pm every day. Denies CP, SOB, sleep or appetite issues with the medicine.   Still having shooting right upper abdominal pain several times a week, seeming to be triggered by certain foods. Typically the pain also comes with waves of nausea for which zofran provides some relief. Was scheduled to see GI as COVID 19 precautions went into place a few weeks ago and appt had to be moved 90 days out. Recent CT abdomen pelvis unremarkable. Pt thinks it may be celiac or gallbladder, both preliminarily coming back benign on testing. Daughter has celiac. Denies fevers, melena, hematemesis, inability to tolerate PO.   Relevant past medical, surgical, family and social history reviewed and  updated as indicated. Interim medical history since our last visit reviewed. Allergies and medications reviewed and updated.  Review of Systems  Per HPI unless specifically indicated above     Objective:    There were no vitals taken for this visit.  Wt Readings from Last 3 Encounters:  03/08/18 128 lb (58.1 kg)  03/03/18 128 lb 14.4 oz (58.5 kg)  03/01/18 130 lb (59 kg)    Physical Exam Vitals signs and nursing note reviewed.  Constitutional:      General: She is not in acute distress.    Appearance: Normal appearance.  HENT:     Head: Atraumatic.     Right Ear: External ear normal.     Left Ear: External ear normal.     Nose: Nose normal. No congestion.     Mouth/Throat:     Mouth: Mucous membranes are moist.     Pharynx: Oropharynx is clear. No posterior oropharyngeal erythema.  Eyes:     Extraocular Movements: Extraocular movements intact.     Conjunctiva/sclera: Conjunctivae normal.  Neck:     Musculoskeletal: Normal range of motion.  Cardiovascular:     Comments: Unable to assess via virtual visit Pulmonary:     Effort: Pulmonary effort is normal. No respiratory distress.  Abdominal:     Comments: Nontender to palpation on patient's self exam, no obvious signs of distress noted during her exam  Musculoskeletal: Normal range of motion.  Skin:    General:  Skin is dry.     Findings: No erythema.  Neurological:     Mental Status: She is alert and oriented to person, place, and time.  Psychiatric:        Mood and Affect: Mood normal.        Thought Content: Thought content normal.        Judgment: Judgment normal.     Results for orders placed or performed during the hospital encounter of 03/08/18  Craig rt PCR ([facility] only)  Result Value Ref Range   Specimen source GC/Chlam ENDOCERVICAL    Chlamydia Tr NOT DETECTED NOT DETECTED   N gonorrhoeae NOT DETECTED NOT DETECTED  Wet prep, genital  Result Value Ref Range   Yeast Wet Prep HPF POC NONE SEEN  NONE SEEN   Trich, Wet Prep NONE SEEN NONE SEEN   Clue Cells Wet Prep HPF POC NONE SEEN NONE SEEN   WBC, Wet Prep HPF POC MODERATE (A) NONE SEEN   Sperm PRESENT   CBC with Differential/Platelet  Result Value Ref Range   WBC 10.1 4.0 - 10.5 K/uL   RBC 4.29 3.87 - 5.11 MIL/uL   Hemoglobin 13.0 12.0 - 15.0 g/dL   HCT 37.6 36.0 - 46.0 %   MCV 87.6 80.0 - 100.0 fL   MCH 30.3 26.0 - 34.0 pg   MCHC 34.6 30.0 - 36.0 g/dL   RDW 12.6 11.5 - 15.5 %   Platelets 177 150 - 400 K/uL   nRBC 0.0 0.0 - 0.2 %   Neutrophils Relative % 66 %   Neutro Abs 6.6 1.7 - 7.7 K/uL   Lymphocytes Relative 23 %   Lymphs Abs 2.3 0.7 - 4.0 K/uL   Monocytes Relative 5 %   Monocytes Absolute 0.5 0.1 - 1.0 K/uL   Eosinophils Relative 5 %   Eosinophils Absolute 0.5 0.0 - 0.5 K/uL   Basophils Relative 1 %   Basophils Absolute 0.1 0.0 - 0.1 K/uL   Immature Granulocytes 0 %   Abs Immature Granulocytes 0.02 0.00 - 0.07 K/uL  Comprehensive metabolic panel  Result Value Ref Range   Sodium 138 135 - 145 mmol/L   Potassium 3.6 3.5 - 5.1 mmol/L   Chloride 104 98 - 111 mmol/L   CO2 27 22 - 32 mmol/L   Glucose, Bld 99 70 - 99 mg/dL   BUN 14 6 - 20 mg/dL   Creatinine, Ser 0.61 0.44 - 1.00 mg/dL   Calcium 8.8 (L) 8.9 - 10.3 mg/dL   Total Protein 6.9 6.5 - 8.1 g/dL   Albumin 4.2 3.5 - 5.0 g/dL   AST 19 15 - 41 U/L   ALT 17 0 - 44 U/L   Alkaline Phosphatase 54 38 - 126 U/L   Total Bilirubin 0.5 0.3 - 1.2 mg/dL   GFR calc non Af Amer >60 >60 mL/min   GFR calc Af Amer >60 >60 mL/min   Anion gap 7 5 - 15  hCG, quantitative, pregnancy  Result Value Ref Range   hCG, Beta Chain, Quant, S <1 <5 mIU/mL  Urinalysis, Complete w Microscopic  Result Value Ref Range   Color, Urine YELLOW (A) YELLOW   APPearance CLEAR (A) CLEAR   Specific Gravity, Urine 1.028 1.005 - 1.030   pH 5.0 5.0 - 8.0   Glucose, UA NEGATIVE NEGATIVE mg/dL   Hgb urine dipstick NEGATIVE NEGATIVE   Bilirubin Urine NEGATIVE NEGATIVE   Ketones, ur  NEGATIVE NEGATIVE mg/dL   Protein, ur NEGATIVE NEGATIVE mg/dL  Nitrite NEGATIVE NEGATIVE   Leukocytes,Ua NEGATIVE NEGATIVE   RBC / HPF 0-5 0 - 5 RBC/hpf   WBC, UA 0-5 0 - 5 WBC/hpf   Bacteria, UA NONE SEEN NONE SEEN   Squamous Epithelial / LPF 0-5 0 - 5   Mucus PRESENT   Pregnancy, urine POC  Result Value Ref Range   Preg Test, Ur NEGATIVE NEGATIVE      Assessment & Plan:   Problem List Items Addressed This Visit      Other   ADD (attention deficit disorder) - Primary    Will add 5 mg adderall for afternoon use to the 20 mg morning dose and monitor closely for benefit. Behavioral modifications reviewed as well, counseling recommended       Other Visit Diagnoses    Right upper quadrant abdominal pain       Intermittent, mild-moderate. Await GI consult once COVID precautions listed. Return precautions to ER given. Continue zofran prn, avoid triggers       Follow up plan: Return in about 4 weeks (around 05/30/2018) for ADD f/u.

## 2018-05-02 NOTE — Assessment & Plan Note (Signed)
Will add 5 mg adderall for afternoon use to the 20 mg morning dose and monitor closely for benefit. Behavioral modifications reviewed as well, counseling recommended

## 2018-05-09 ENCOUNTER — Ambulatory Visit: Payer: Self-pay | Admitting: *Deleted

## 2018-05-09 NOTE — Telephone Encounter (Signed)
Pt called with having a possible kidney stone. Stated the right flank pain started yesterday and now having blood in her urine. Has been taking ibuprofen and Tylenol for the pain with little relief. Denies fever and abd pain. Home care advice given to hydrated (she has cranberry juice), take pain meds as needed but don't over medicate and use heat to her back for relief. Pt voiced understanding. And to call back for increase in pain or other symptoms, including fever. Advised of having a virtual visit, pt voiced understanding. Routing to flow at [facility] for review and an appointment. Pt request that, the office calls her until she is reached. She has been having problems with her phone.  Reason for Disposition . MODERATE pain (e.g., interferes with normal activities or awakens from sleep)  Answer Assessment - Initial Assessment Questions 1. LOCATION: "Where does it hurt?" (e.g., left, right)    Right side 2. ONSET: "When did the pain start?"     yesterday 3. SEVERITY: "How bad is the pain?" (e.g., Scale 1-10; mild, moderate, or severe)   - MILD (1-3): doesn't interfere with normal activities    - MODERATE (4-7): interferes with normal activities or awakens from sleep    - SEVERE (8-10): excruciating pain and patient unable to do normal activities (stays in bed)       ;pain# 9 4. PATTERN: "Does the pain come and go, or is it constant?"      constant 5. CAUSE: "What do you think is causing the pain?"     Kidney stones 6. OTHER SYMPTOMS:  "Do you have any other symptoms?" (e.g., fever, abdominal pain, vomiting, leg weakness, burning with urination, blood in urine)     Blood in urine, some burning with urination,  7. PREGNANCY:  "Is there any chance you are pregnant?" "When was your last menstrual period?"     No LMP irregular periods, tubal ligation  Protocols used: FLANK PAIN-A-AH

## 2018-05-10 ENCOUNTER — Other Ambulatory Visit: Payer: Self-pay

## 2018-05-10 ENCOUNTER — Telehealth: Payer: Self-pay | Admitting: Family Medicine

## 2018-05-10 ENCOUNTER — Ambulatory Visit (INDEPENDENT_AMBULATORY_CARE_PROVIDER_SITE_OTHER): Payer: Medicaid Other | Admitting: Family Medicine

## 2018-05-10 ENCOUNTER — Encounter: Payer: Self-pay | Admitting: Family Medicine

## 2018-05-10 VITALS — Temp 98.6°F | Ht 63.0 in | Wt 126.0 lb

## 2018-05-10 DIAGNOSIS — R310 Gross hematuria: Secondary | ICD-10-CM

## 2018-05-10 MED ORDER — TAMSULOSIN HCL 0.4 MG PO CAPS: 0.4000 mg | ORAL_CAPSULE | Freq: Every day | ORAL | 0 refills | Status: DC

## 2018-05-10 MED ORDER — HYDROCODONE-ACETAMINOPHEN 5-325 MG PO TABS: 1.0000 | ORAL_TABLET | Freq: Three times a day (TID) | ORAL | 0 refills | Status: DC | PRN

## 2018-05-10 NOTE — Progress Notes (Signed)
Temp 98.6 F (37 C) (Oral)   Ht 5\' 3"  (1.6 m)   Wt 126 lb (57.2 kg)   BMI 22.32 kg/m    Subjective:    Patient ID: Cassandra Duarte, female    DOB: Oct 24, 1985, 33 y.o.   MRN: [id number]  HPI: Cassandra Duarte is a 32 y.o. female  Chief Complaint  Patient presents with  . Back Pain    middle right side x about 2 days. tried OTC tylenol and ibuprofen  . Hematuria    painful urination x 3 days    . This visit was completed via WebEx due to the restrictions of the COVID-19 pandemic. All issues as above were discussed and addressed. Physical exam was done as above through visual confirmation on WebEx. If it was felt that the patient should be evaluated in the office, they were directed there. The patient verbally consented to this visit. . Location of the patient: home . Location of the provider: work . Those involved with this call:  . Provider: Merrie Roof, PA-C . CMA: Gerda Diss, CMA . Front Desk/Registration: Jill Side  . Time spent on call: 15 minutes with patient face to face via video conference. More than 50% of this time was spent in counseling and coordination of care. 5 minutes total spent in review of patient's record and preparation of their chart. I verified patient identity using two factors (patient name and date of birth). Patient consents verbally to being seen via telemedicine visit today.   Patient presenting with 2-3 days of right flank pain slowly over time moving down toward pelvis and 1 day of hematuria. Known hx of kidney stones, states this feels consistent to her. Denies fever, chills, abdominal pain, N/V/D. OTC pain relievers not helping whatsoever.   Relevant past medical, surgical, family and social history reviewed and updated as indicated. Interim medical history since our last visit reviewed. Allergies and medications reviewed and updated.  Review of Systems  Per HPI unless specifically indicated above     Objective:    Temp 98.6  F (37 C) (Oral)   Ht 5\' 3"  (1.6 m)   Wt 126 lb (57.2 kg)   BMI 22.32 kg/m   Wt Readings from Last 3 Encounters:  05/13/18 123 lb 6 oz (56 kg)  05/10/18 126 lb (57.2 kg)  03/08/18 128 lb (58.1 kg)    Physical Exam Vitals signs and nursing note reviewed.  Constitutional:      General: She is not in acute distress.    Appearance: Normal appearance.  HENT:     Head: Atraumatic.     Right Ear: External ear normal.     Left Ear: External ear normal.     Nose: Nose normal. No congestion.     Mouth/Throat:     Mouth: Mucous membranes are moist.     Pharynx: Oropharynx is clear. No posterior oropharyngeal erythema.  Eyes:     Extraocular Movements: Extraocular movements intact.     Conjunctiva/sclera: Conjunctivae normal.  Neck:     Musculoskeletal: Normal range of motion.  Cardiovascular:     Comments: Unable to assess via virtual visit Pulmonary:     Effort: Pulmonary effort is normal. No respiratory distress.  Abdominal:     General: Bowel sounds are normal.     Palpations: Abdomen is soft.     Tenderness: There is no abdominal tenderness (on patient's self palpation). There is no guarding.  Musculoskeletal: Normal range of motion.  Skin:  General: Skin is dry.     Findings: No erythema.  Neurological:     Mental Status: She is alert and oriented to person, place, and time.  Psychiatric:        Mood and Affect: Mood normal.        Thought Content: Thought content normal.        Judgment: Judgment normal.     Results for orders placed or performed during the hospital encounter of 03/08/18  [place] rt PCR ([facility] only)  Result Value Ref Range   Specimen source GC/Chlam ENDOCERVICAL    Chlamydia Tr NOT DETECTED NOT DETECTED   N gonorrhoeae NOT DETECTED NOT DETECTED  Wet prep, genital  Result Value Ref Range   Yeast Wet Prep HPF POC NONE SEEN NONE SEEN   Trich, Wet Prep NONE SEEN NONE SEEN   Clue Cells Wet Prep HPF POC NONE SEEN NONE SEEN   WBC, Wet Prep HPF  POC MODERATE (A) NONE SEEN   Sperm PRESENT   CBC with Differential/Platelet  Result Value Ref Range   WBC 10.1 4.0 - 10.5 K/uL   RBC 4.29 3.87 - 5.11 MIL/uL   Hemoglobin 13.0 12.0 - 15.0 g/dL   HCT 37.6 36.0 - 46.0 %   MCV 87.6 80.0 - 100.0 fL   MCH 30.3 26.0 - 34.0 pg   MCHC 34.6 30.0 - 36.0 g/dL   RDW 12.6 11.5 - 15.5 %   Platelets 177 150 - 400 K/uL   nRBC 0.0 0.0 - 0.2 %   Neutrophils Relative % 66 %   Neutro Abs 6.6 1.7 - 7.7 K/uL   Lymphocytes Relative 23 %   Lymphs Abs 2.3 0.7 - 4.0 K/uL   Monocytes Relative 5 %   Monocytes Absolute 0.5 0.1 - 1.0 K/uL   Eosinophils Relative 5 %   Eosinophils Absolute 0.5 0.0 - 0.5 K/uL   Basophils Relative 1 %   Basophils Absolute 0.1 0.0 - 0.1 K/uL   Immature Granulocytes 0 %   Abs Immature Granulocytes 0.02 0.00 - 0.07 K/uL  Comprehensive metabolic panel  Result Value Ref Range   Sodium 138 135 - 145 mmol/L   Potassium 3.6 3.5 - 5.1 mmol/L   Chloride 104 98 - 111 mmol/L   CO2 27 22 - 32 mmol/L   Glucose, Bld 99 70 - 99 mg/dL   BUN 14 6 - 20 mg/dL   Creatinine, Ser 0.61 0.44 - 1.00 mg/dL   Calcium 8.8 (L) 8.9 - 10.3 mg/dL   Total Protein 6.9 6.5 - 8.1 g/dL   Albumin 4.2 3.5 - 5.0 g/dL   AST 19 15 - 41 U/L   ALT 17 0 - 44 U/L   Alkaline Phosphatase 54 38 - 126 U/L   Total Bilirubin 0.5 0.3 - 1.2 mg/dL   GFR calc non Af Amer >60 >60 mL/min   GFR calc Af Amer >60 >60 mL/min   Anion gap 7 5 - 15  hCG, quantitative, pregnancy  Result Value Ref Range   hCG, Beta Chain, Quant, S <1 <5 mIU/mL  Urinalysis, Complete w Microscopic  Result Value Ref Range   Color, Urine YELLOW (A) YELLOW   APPearance CLEAR (A) CLEAR   Specific Gravity, Urine 1.028 1.005 - 1.030   pH 5.0 5.0 - 8.0   Glucose, UA NEGATIVE NEGATIVE mg/dL   Hgb urine dipstick NEGATIVE NEGATIVE   Bilirubin Urine NEGATIVE NEGATIVE   Ketones, ur NEGATIVE NEGATIVE mg/dL   Protein, ur NEGATIVE NEGATIVE mg/dL  Nitrite NEGATIVE NEGATIVE   Leukocytes,Ua NEGATIVE NEGATIVE    RBC / HPF 0-5 0 - 5 RBC/hpf   WBC, UA 0-5 0 - 5 WBC/hpf   Bacteria, UA NONE SEEN NONE SEEN   Squamous Epithelial / LPF 0-5 0 - 5   Mucus PRESENT   Pregnancy, urine POC  Result Value Ref Range   Preg Test, Ur NEGATIVE NEGATIVE      Assessment & Plan:   Problem List Items Addressed This Visit    None    Visit Diagnoses    Gross hematuria    -  Primary   Suspect kidney stones, will check U/A and start flomax. Push fluids, small norco supply for severe pain. Continue OTC pain relievers   Relevant Orders   UA/M w/rflx Culture, Routine (Completed)       Follow up plan: Return for as scheduled.

## 2018-05-10 NOTE — Telephone Encounter (Signed)
Please get her scheduled

## 2018-05-10 NOTE — Telephone Encounter (Signed)
Another encounter has been opened for this. Closing encounter to continue on the encounter with triage note.

## 2018-05-10 NOTE — Telephone Encounter (Signed)
Ok to schedule OV   Copied from [place]. Topic: Quick Communication - See Telephone Encounter >> May 09, 2018  3:06 PM Loma Boston wrote: CRM for notification. See Telephone encounter for: 05/09/18. Contacted office staff and requested CRM PT has a kidney stone and was wanting either a virtual or to be seen. Please advise asap as pt is in pain.  Contact at [phone]

## 2018-05-10 NOTE — Telephone Encounter (Signed)
Called pt no answer, unable to LVM.

## 2018-05-11 ENCOUNTER — Other Ambulatory Visit: Payer: Medicaid Other

## 2018-05-12 ENCOUNTER — Other Ambulatory Visit: Payer: Self-pay

## 2018-05-12 ENCOUNTER — Other Ambulatory Visit: Payer: Medicaid Other

## 2018-05-12 ENCOUNTER — Telehealth: Payer: Self-pay | Admitting: Family Medicine

## 2018-05-12 DIAGNOSIS — R310 Gross hematuria: Secondary | ICD-10-CM | POA: Diagnosis not present

## 2018-05-12 LAB — UA/M W/RFLX CULTURE, ROUTINE
Bilirubin, UA: NEGATIVE
Glucose, UA: NEGATIVE
Ketones, UA: NEGATIVE
Leukocytes,UA: NEGATIVE
Nitrite, UA: NEGATIVE
Protein,UA: NEGATIVE
RBC, UA: NEGATIVE
Specific Gravity, UA: 1.02 (ref 1.005–1.030)
Urobilinogen, Ur: 0.2 mg/dL (ref 0.2–1.0)
pH, UA: 7 (ref 5.0–7.5)

## 2018-05-12 MED ORDER — HYDROCODONE-ACETAMINOPHEN 5-325 MG PO TABS: 1.0000 | ORAL_TABLET | Freq: Three times a day (TID) | ORAL | 0 refills | Status: DC | PRN

## 2018-05-12 NOTE — Telephone Encounter (Signed)
Will give 3 more tabs but she should not be out yet as it was only sent 2 days ago, that supply was to be used sparingly to last entire episode. Will not refill beyond 3 more tabs. If still in that much pain we will need to evaluate further at that time

## 2018-05-12 NOTE — Telephone Encounter (Signed)
Pt called in to reschedule lab. She is wanting to know if she can get a couple more of the hydrocodone pills due to not passing stone yet. She states she feels like she is about to pass it but is still in pain. Please advise.

## 2018-05-12 NOTE — Telephone Encounter (Signed)
Patient notified and verbalized understanding. 

## 2018-05-13 ENCOUNTER — Other Ambulatory Visit: Payer: Self-pay

## 2018-05-13 ENCOUNTER — Emergency Department: Payer: Medicaid Other

## 2018-05-13 ENCOUNTER — Emergency Department
Admission: EM | Admit: 2018-05-13 | Discharge: 2018-05-13 | Disposition: A | Discharge status: Home / Self Care | Payer: Medicaid Other | Attending: Emergency Medicine | Admitting: Emergency Medicine

## 2018-05-13 DIAGNOSIS — S3992XA Unspecified injury of lower back, initial encounter: Secondary | ICD-10-CM | POA: Diagnosis not present

## 2018-05-13 DIAGNOSIS — W108XXA Fall (on) (from) other stairs and steps, initial encounter: Secondary | ICD-10-CM | POA: Diagnosis not present

## 2018-05-13 DIAGNOSIS — S322XXA Fracture of coccyx, initial encounter for closed fracture: Secondary | ICD-10-CM | POA: Insufficient documentation

## 2018-05-13 DIAGNOSIS — Y939 Activity, unspecified: Secondary | ICD-10-CM | POA: Insufficient documentation

## 2018-05-13 DIAGNOSIS — Z79899 Other long term (current) drug therapy: Secondary | ICD-10-CM | POA: Diagnosis not present

## 2018-05-13 DIAGNOSIS — M533 Sacrococcygeal disorders, not elsewhere classified: Secondary | ICD-10-CM | POA: Diagnosis not present

## 2018-05-13 DIAGNOSIS — Z87891 Personal history of nicotine dependence: Secondary | ICD-10-CM | POA: Diagnosis not present

## 2018-05-13 DIAGNOSIS — Y929 Unspecified place or not applicable: Secondary | ICD-10-CM | POA: Diagnosis not present

## 2018-05-13 DIAGNOSIS — Y999 Unspecified external cause status: Secondary | ICD-10-CM | POA: Diagnosis not present

## 2018-05-13 LAB — POCT PREGNANCY, URINE: Preg Test, Ur: NEGATIVE

## 2018-05-13 MED ORDER — HYDROCODONE-ACETAMINOPHEN 5-325 MG PO TABS: 1.0000 | ORAL_TABLET | Freq: Three times a day (TID) | ORAL | 0 refills | Status: DC | PRN

## 2018-05-13 NOTE — Discharge Instructions (Addendum)
Use MiraLAX daily.  Take pain medication as needed.  Be worse the narcotics can cause dependency.  Otherwise try Tylenol or ibuprofen.  Follow-up with your regular doctor if not improving in 5 to 7 days.  Return emergency department worsening.

## 2018-05-13 NOTE — ED Triage Notes (Signed)
Pt states fell last night. States back pain and tailbone pain. Was trying to get son from jumping off porch when fell. States back hit wooden step and then landed on concrete. A&O, ambulatory. Denies LOC.
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
## 2018-05-13 NOTE — ED Notes (Signed)
Pt presents c/o lower back pain. Per patient she fell last night no concrete steps. She reports pain with movement  and especially while trying to "move bowels" this morning. RR even and unlabored. No acute distress noted.

## 2018-05-15 ENCOUNTER — Telehealth: Payer: Self-pay | Admitting: Family Medicine

## 2018-05-15 DIAGNOSIS — Z20828 Contact with and (suspected) exposure to other viral communicable diseases: Secondary | ICD-10-CM | POA: Diagnosis not present

## 2018-05-15 DIAGNOSIS — M5416 Radiculopathy, lumbar region: Secondary | ICD-10-CM | POA: Diagnosis not present

## 2018-05-15 DIAGNOSIS — M5442 Lumbago with sciatica, left side: Secondary | ICD-10-CM | POA: Diagnosis not present

## 2018-05-15 NOTE — Telephone Encounter (Signed)
Noted, and she should still be a patient there so she can direct schedule  Copied from [place] [phone]. Topic: General - Inquiry >> May 15, 2018 11:15 AM Scherrie Gerlach wrote: Reason for CRM: pt states she fell sat am and went to ED, pt fractured her coccyx.   Pt given pain pills and she is to let Apolonio Schneiders know because she is under contract. Also wants to know if she needs referral to an orthopedic.  Pt went to one the last time she broke her tailbone, and went [facility]. Pt will call [facility] when she feels better tomorrow.

## 2018-05-16 ENCOUNTER — Other Ambulatory Visit: Payer: Self-pay

## 2018-05-16 ENCOUNTER — Emergency Department: Payer: Medicaid Other

## 2018-05-16 ENCOUNTER — Emergency Department
Admission: EM | Admit: 2018-05-16 | Discharge: 2018-05-16 | Disposition: A | Discharge status: Home / Self Care | Payer: Medicaid Other | Attending: Emergency Medicine | Admitting: Emergency Medicine

## 2018-05-16 ENCOUNTER — Encounter: Payer: Self-pay | Admitting: Emergency Medicine

## 2018-05-16 DIAGNOSIS — Z87891 Personal history of nicotine dependence: Secondary | ICD-10-CM | POA: Insufficient documentation

## 2018-05-16 DIAGNOSIS — M5416 Radiculopathy, lumbar region: Secondary | ICD-10-CM | POA: Diagnosis not present

## 2018-05-16 DIAGNOSIS — M533 Sacrococcygeal disorders, not elsewhere classified: Secondary | ICD-10-CM | POA: Diagnosis not present

## 2018-05-16 DIAGNOSIS — Z79899 Other long term (current) drug therapy: Secondary | ICD-10-CM | POA: Diagnosis not present

## 2018-05-16 DIAGNOSIS — M545 Low back pain: Secondary | ICD-10-CM | POA: Diagnosis not present

## 2018-05-16 DIAGNOSIS — S3992XA Unspecified injury of lower back, initial encounter: Secondary | ICD-10-CM | POA: Diagnosis not present

## 2018-05-16 DIAGNOSIS — M5442 Lumbago with sciatica, left side: Secondary | ICD-10-CM | POA: Diagnosis not present

## 2018-05-16 DIAGNOSIS — Z20828 Contact with and (suspected) exposure to other viral communicable diseases: Secondary | ICD-10-CM | POA: Diagnosis not present

## 2018-05-16 MED ORDER — PREDNISONE 10 MG PO TABS: 10.0000 mg | ORAL_TABLET | Freq: Every day | ORAL | 0 refills | Status: DC

## 2018-05-16 MED ORDER — OXYCODONE-ACETAMINOPHEN 5-325 MG PO TABS
2.0000 | ORAL_TABLET | Freq: Once | ORAL | Status: AC
  Administered 2018-05-16: 2 via ORAL
  Filled 2018-05-16: qty 2
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
## 2018-05-17 ENCOUNTER — Telehealth: Payer: Self-pay | Admitting: Family Medicine

## 2018-05-17 DIAGNOSIS — M5412 Radiculopathy, cervical region: Secondary | ICD-10-CM | POA: Insufficient documentation

## 2018-05-17 DIAGNOSIS — M5416 Radiculopathy, lumbar region: Secondary | ICD-10-CM | POA: Diagnosis not present
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
## 2018-05-19 DIAGNOSIS — M25512 Pain in left shoulder: Secondary | ICD-10-CM | POA: Diagnosis not present

## 2018-05-30 DIAGNOSIS — M5412 Radiculopathy, cervical region: Secondary | ICD-10-CM | POA: Diagnosis not present

## 2018-05-30 DIAGNOSIS — M5416 Radiculopathy, lumbar region: Secondary | ICD-10-CM | POA: Diagnosis not present

## 2018-05-31 ENCOUNTER — Other Ambulatory Visit: Payer: Self-pay

## 2018-05-31 ENCOUNTER — Encounter: Payer: Self-pay | Admitting: Family Medicine

## 2018-05-31 ENCOUNTER — Ambulatory Visit (INDEPENDENT_AMBULATORY_CARE_PROVIDER_SITE_OTHER): Payer: Medicaid Other | Admitting: Family Medicine

## 2018-05-31 DIAGNOSIS — M25512 Pain in left shoulder: Secondary | ICD-10-CM | POA: Diagnosis not present

## 2018-05-31 DIAGNOSIS — F988 Other specified behavioral and emotional disorders with onset usually occurring in childhood and adolescence: Secondary | ICD-10-CM

## 2018-05-31 MED ORDER — AMPHETAMINE-DEXTROAMPHETAMINE 20 MG PO TABS: 20.0000 mg | ORAL_TABLET | Freq: Every day | ORAL | 0 refills | Status: DC | PRN
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
## 2018-06-01 ENCOUNTER — Ambulatory Visit: Payer: Medicaid Other | Admitting: Family Medicine

## 2018-06-01 DIAGNOSIS — M25512 Pain in left shoulder: Secondary | ICD-10-CM | POA: Diagnosis not present

## 2018-06-02 ENCOUNTER — Emergency Department: Payer: Medicaid Other

## 2018-06-02 ENCOUNTER — Telehealth: Payer: Self-pay | Admitting: Family Medicine

## 2018-06-02 ENCOUNTER — Other Ambulatory Visit: Payer: Self-pay

## 2018-06-02 ENCOUNTER — Encounter: Payer: Self-pay | Admitting: Emergency Medicine

## 2018-06-02 ENCOUNTER — Emergency Department
Admission: EM | Admit: 2018-06-02 | Discharge: 2018-06-02 | Disposition: A | Discharge status: Home / Self Care | Payer: Medicaid Other | Attending: Emergency Medicine | Admitting: Emergency Medicine

## 2018-06-02 ENCOUNTER — Other Ambulatory Visit: Payer: Self-pay | Admitting: Family Medicine

## 2018-06-02 DIAGNOSIS — Z87891 Personal history of nicotine dependence: Secondary | ICD-10-CM | POA: Insufficient documentation

## 2018-06-02 DIAGNOSIS — S46912A Strain of unspecified muscle, fascia and tendon at shoulder and upper arm level, left arm, initial encounter: Secondary | ICD-10-CM | POA: Diagnosis not present

## 2018-06-02 DIAGNOSIS — M25512 Pain in left shoulder: Secondary | ICD-10-CM | POA: Diagnosis not present

## 2018-06-02 DIAGNOSIS — S4992XA Unspecified injury of left shoulder and upper arm, initial encounter: Secondary | ICD-10-CM | POA: Diagnosis not present

## 2018-06-02 MED ORDER — LIDOCAINE 5 % EX PTCH
1.0000 | MEDICATED_PATCH | CUTANEOUS | Status: DC
  Administered 2018-06-02: 1 via TRANSDERMAL
  Filled 2018-06-02: qty 1

## 2018-06-02 MED ORDER — ONDANSETRON 4 MG PO TBDP
4.0000 mg | ORAL_TABLET | Freq: Once | ORAL | Status: AC
  Administered 2018-06-02: 4 mg via ORAL

## 2018-06-02 MED ORDER — ONDANSETRON 4 MG PO TBDP
ORAL_TABLET | ORAL | Status: AC
  Filled 2018-06-02: qty 1

## 2018-06-02 MED ORDER — CYCLOBENZAPRINE HCL 10 MG PO TABS
10.0000 mg | ORAL_TABLET | Freq: Once | ORAL | Status: AC
  Administered 2018-06-02: 10 mg via ORAL
  Filled 2018-06-02: qty 1

## 2018-06-02 NOTE — Telephone Encounter (Signed)
Copied from [place] [phone]. Topic: Quick Communication - Rx Refill/Question >> Jun 02, 2018 10:18 AM Margot Ables wrote: Medication: ondansetron (ZOFRAN ODT) 4 MG disintegrating tablet - pt asked for a refill as her GI appt has been delayed due to [place]  Has the patient contacted their pharmacy? No - no refills Preferred Pharmacy (with phone number or street name): [facility] [place], [place] [facility] [phone] (Phone) [phone] (Fax)

## 2018-06-02 NOTE — ED Triage Notes (Signed)
Patient ambulatory to triage with steady gait, without difficulty or distress noted; pt reports pop to left shoulder after lifting her son this evening

## 2018-06-03 NOTE — ED Provider Notes (Signed)
[facility] Emergency Department Provider Note    First MD Initiated Contact with Patient 06/02/18 [phone]     (approximate)  I have reviewed the triage vital signs and the nursing notes.   HISTORY  Chief Complaint Shoulder Injury    HPI Cassandra Duarte is a 33 y.o. female with below list of previous medical conditions presents to the emergency department with complaint of left shoulder pain this evening when she picked up her son.  Patient states that she felt a pop.  Patient states current pain score is 9 out of 10   Past Medical History:  Diagnosis Date  . Family history of adverse reaction to anesthesia    dad-n/v  . History of kidney stones    h/o  . Vaginal Pap smear, abnormal 2012   REPEAT AND WAS NORMAL    Patient Active Problem List   Diagnosis Date Noted  . Controlled substance agreement signed 11/29/2017  . Dysuria 10/10/2017  . Status post laparoscopy with adhesio lysis 07/13/2017  . Chronic pain syndrome 06/29/2017  . 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 Myofascial pain 06/29/2017  . ADD (attention deficit disorder) 05/26/2017  . Menorrhagia with regular cycle 05/03/2017  . Dysmenorrhea 05/03/2017  . Overweight 10/01/2015  . Lactational amenorrhea 10/01/2015  . Family history of breast cancer 10/01/2015  . 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 DILATION AND CURETTAGE OF UTERUS  2014   WS  . LAPAROSCOPIC LYSIS OF ADHESIONS  06/20/2017   Procedure: LAPAROSCOPIC LYSIS OF ADHESIONS;  Surgeon: Brayton Mars, MD;  Location: [facility] ORS;  Service: Gynecology;;  . LAPAROSCOPY N/A 06/20/2017   Procedure: OPERATIVE DIAGNOSTIC WITH BIOPSIES;  Surgeon: Brayton Mars, MD;  Location: [facility] ORS;  Service: Gynecology;  Laterality: N/A;  . TUBAL LIGATION Bilateral 02/21/2015   Procedure: BILATERAL TUBAL LIGATION;  Surgeon: Brayton Mars, MD;   Location: [facility] ORS;  Service: Gynecology;  Laterality: Bilateral;    Prior to Admission medications   Medication Sig Start Date End Date Taking? Authorizing Provider  amphetamine-dextroamphetamine (ADDERALL) 20 MG tablet Take 1 tablet (20 mg total) by mouth daily as needed. 07/31/18   Volney American, PA-C  amphetamine-dextroamphetamine (ADDERALL) 20 MG tablet Take 1 tablet (20 mg total) by mouth daily as needed. 07/01/18   Volney American, PA-C  amphetamine-dextroamphetamine (ADDERALL) 20 MG tablet Take 1 tablet (20 mg total) by mouth daily as needed. 05/31/18   Volney American, PA-C  amphetamine-dextroamphetamine (ADDERALL) 5 MG tablet Take 1 tablet (5 mg total) by mouth daily. 05/02/18   Volney American, PA-C  HYDROcodone-acetaminophen (NORCO/VICODIN) 5-325 MG tablet Take 1 tablet by mouth every 8 (eight) hours as needed for moderate pain. 05/13/18   Caryn Section Linden Dolin, PA-C  Multiple Vitamin (MULTIVITAMINS PO) Take by mouth daily.    [provider]  ondansetron (ZOFRAN ODT) 4 MG disintegrating tablet Take 1 tablet (4 mg total) by mouth every 8 (eight) hours as needed. 05/02/18   Volney American, PA-C  tamsulosin (FLOMAX) 0.4 MG CAPS capsule Take 1 capsule (0.4 mg total) by mouth daily. Patient not taking: Reported on 05/31/2018 05/10/18   Volney American, PA-C  VITAMIN D PO Take by mouth once a week.    [provider]    Allergies Magnesium-containing compounds; Toradol [ketorolac tromethamine]; Tamiflu [oseltamivir phosphate]; and Tramadol  Family History  Problem Relation Age of Onset  .  Breast cancer Paternal Aunt   . Diabetes Paternal Aunt   . Diabetes Maternal Grandfather   . COPD Maternal Grandmother   . Cancer Maternal Grandmother        lung  . Colon cancer Neg Hx   . Ovarian cancer Neg Hx   . Heart disease Neg Hx     Social History Social History   Tobacco Use  . Smoking status: Former Smoker    Packs/day: 0.50     Years: 4.00    Pack years: 2.00    Types: Cigarettes    Last attempt to quit: 04/02/2014    Years since quitting: 4.1  . Smokeless tobacco: Never Used  Substance Use Topics  . Alcohol use: No    Alcohol/week: 0.0 standard drinks  . Drug use: No    Review of Systems Constitutional: No fever/chills Eyes: No visual changes. ENT: No sore throat. Cardiovascular: Denies chest pain. Respiratory: Denies shortness of breath. Gastrointestinal: No abdominal pain.  No nausea, no vomiting.  No diarrhea.  No constipation. Genitourinary: Negative for dysuria. Musculoskeletal: Negative for neck pain.  Negative for back pain.  Positive for left shoulder pain Integumentary: Negative for rash. Neurological: Negative for headaches, focal weakness or numbness.   ____________________________________________   PHYSICAL EXAM:  VITAL SIGNS: ED Triage Vitals  Enc Vitals Group     BP 06/02/18 0026 116/83     Pulse Rate 06/02/18 0026 96     Resp 06/02/18 0026 18     Temp 06/02/18 0026 98.2 F (36.8 C)     Temp Source 06/02/18 0026 Oral     SpO2 06/02/18 0026 100 %     Weight --      Height --      Head Circumference --      Peak Flow --      Pain Score 06/02/18 0025 9     Pain Loc --      Pain Edu? --      Excl. in [place]? --     Constitutional: Alert and oriented. Well appearing and in no acute distress. Eyes: Conjunctivae are normal.  Mouth/Throat: Mucous membranes are moist.Oropharynx non-erythematous. Neck: No stridor.  No cervical spine tenderness to palpation. Cardiovascular: Normal rate, regular rhythm. Good peripheral circulation. Grossly normal heart sounds. Respiratory: Normal respiratory effort.  No retractions. No audible wheezing. Musculoskeletal: Pain to palpation in the area of the left rhomboids as well as supraspinatus and infraspinatus scapular muscles Neurologic:  Normal speech and language. No gross focal neurologic deficits are appreciated.  Skin:  Skin is warm, dry and  intact. No rash noted. Psychiatric: Mood and affect are normal. Speech and behavior are normal.  _  RADIOLOGY I, [place], personally viewed and evaluated these images (plain radiographs) as part of my medical decision making, as well as reviewing the written report by the radiologist.  ED MD interpretation: Negative left shoulder x-ray.  Official radiology report(s): No results found.    Procedures   ____________________________________________   INITIAL IMPRESSION / MDM / ASSESSMENT AND PLAN / ED COURSE  As part of my medical decision making, I reviewed the following data within the electronic MEDICAL RECORD NUMBER   33 year old female presenting with above-stated history and physical exam secondary to left shoulder/back discomfort.  Suspect muscular strain as etiology for the patient's pain is that she was given Flexeril and Lidoderm patch.  *Cassandra Duarte was evaluated in Emergency Department on 06/03/2018 for the symptoms described in the history of  present illness. She was evaluated in the context of the global COVID-19 pandemic, which necessitated consideration that the patient might be at risk for infection with the SARS-CoV-2 virus that causes COVID-19. Institutional protocols and algorithms that pertain to the evaluation of patients at risk for COVID-19 are in a state of rapid change based on information released by regulatory bodies including the CDC and federal and state organizations. These policies and algorithms were followed during the patient's care in the ED.*    ____________________________________________  FINAL CLINICAL IMPRESSION(S) / ED DIAGNOSES  Final diagnoses:  Acute pain of left shoulder     MEDICATIONS GIVEN DURING THIS VISIT:  Medications  cyclobenzaprine (FLEXERIL) tablet 10 mg (10 mg Oral Given 06/02/18 0245)  ondansetron (ZOFRAN-ODT) disintegrating tablet 4 mg (4 mg Oral Given 06/02/18 0249)     ED Discharge Orders    None        Note:  This document was prepared using Dragon voice recognition software and may include unintentional dictation errors.   Gregor Hams, MD 06/03/18 [phone]

## 2018-06-04 NOTE — Assessment & Plan Note (Signed)
Stable and under good control with current regimen of 20 mg short release adderall daily and 5 mg short release adderall as needed. Continue current regimen

## 2018-06-05 MED ORDER — ONDANSETRON 4 MG PO TBDP: 4.0000 mg | ORAL_TABLET | Freq: Three times a day (TID) | ORAL | 0 refills | Status: DC | PRN

## 2018-07-07 ENCOUNTER — Telehealth: Payer: Self-pay | Admitting: Family Medicine

## 2018-07-07 MED ORDER — AMPHETAMINE-DEXTROAMPHETAMINE 5 MG PO TABS: 5.0000 mg | ORAL_TABLET | Freq: Every day | ORAL | 0 refills | Status: DC | PRN

## 2018-07-07 NOTE — Telephone Encounter (Signed)
Pt request refill  amphetamine-dextroamphetamine (ADDERALL) 5 MG tablet   [id number] [address], [place] (Phone) [phone] (Fax)

## 2018-07-07 NOTE — Telephone Encounter (Signed)
RX refilled  

## 2018-07-09 ENCOUNTER — Ambulatory Visit
Admission: EM | Admit: 2018-07-09 | Discharge: 2018-07-09 | Disposition: A | Discharge status: Home / Self Care | Payer: Medicaid Other | Attending: Urgent Care | Admitting: Urgent Care

## 2018-07-09 ENCOUNTER — Other Ambulatory Visit: Payer: Self-pay

## 2018-07-09 ENCOUNTER — Encounter: Payer: Self-pay | Admitting: Emergency Medicine

## 2018-07-09 DIAGNOSIS — M62838 Other muscle spasm: Secondary | ICD-10-CM | POA: Diagnosis not present

## 2018-07-09 DIAGNOSIS — M5412 Radiculopathy, cervical region: Secondary | ICD-10-CM | POA: Diagnosis not present

## 2018-07-09 MED ORDER — HYDROCODONE-ACETAMINOPHEN 5-325 MG PO TABS: 1.0000 | ORAL_TABLET | Freq: Three times a day (TID) | ORAL | 0 refills | Status: DC | PRN

## 2018-07-09 MED ORDER — METHOCARBAMOL 500 MG PO TABS: 500.0000 mg | ORAL_TABLET | Freq: Two times a day (BID) | ORAL | 0 refills | Status: DC

## 2018-07-09 NOTE — Discharge Instructions (Signed)
It was very nice seeing you today in clinic. Thank you for entrusting me with your care.   As discussed, your pain seems to be musculoskeletal in nature. Plans for treating you are as follows:  Please utilize the medications that we discussed. Your prescriptions have been called in to your pharmacy. If you have to use the Norco, please space out administration between it and Robaxin, as they can both make you sleepy.  Continue IBUPROFEN 800 mg three times a day.  Avoid overdoing it, but you need to make efforts to remain active as tolerated.  Avoiding activity all together can make your pain worse. You may find that alternating between ice and moist heat application will help with your pain.  Heat/ice should be applied for 10-15 minutes at a time at least 3-4 times a day.  Make arrangements to follow up with your regular doctor in 1 week for re-evaluation. If your symptoms/condition worsens, please seek follow up care either here or in the ER. Please remember, our [facility] providers are "right here with you" when you need [place].   Again, it was my pleasure to take care of you today. Thank you for choosing our clinic. I hope that you start to feel better quickly.   Honor Loh, MSN, APRN, FNP-C, CEN Advanced Practice Provider [facility]
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
## 2018-07-24 DIAGNOSIS — J019 Acute sinusitis, unspecified: Secondary | ICD-10-CM | POA: Diagnosis not present

## 2018-07-25 ENCOUNTER — Ambulatory Visit (INDEPENDENT_AMBULATORY_CARE_PROVIDER_SITE_OTHER): Payer: Medicaid Other | Admitting: Family Medicine

## 2018-07-25 ENCOUNTER — Encounter: Payer: Self-pay | Admitting: Family Medicine

## 2018-07-25 VITALS — Ht 63.0 in | Wt 120.0 lb

## 2018-07-25 DIAGNOSIS — J0100 Acute maxillary sinusitis, unspecified: Secondary | ICD-10-CM

## 2018-07-25 DIAGNOSIS — R05 Cough: Secondary | ICD-10-CM

## 2018-07-25 DIAGNOSIS — R058 Other specified cough: Secondary | ICD-10-CM

## 2018-07-25 MED ORDER — HYDROCOD POLST-CPM POLST ER 10-8 MG/5ML PO SUER: 5.0000 mL | Freq: Every evening | ORAL | 0 refills | Status: DC | PRN

## 2018-07-25 NOTE — Progress Notes (Signed)
Ht 5\' 3"  (1.6 m)   Wt 120 lb (54.4 kg)   BMI 21.26 kg/m    Subjective:    Patient ID: Cassandra Duarte, female    DOB: 03-11-1985, 33 y.o.   MRN: [id number]  HPI: Cassandra Duarte is a 33 y.o. female  Chief Complaint  Patient presents with  . Cough    Productive cough. Yellow/Green phlegm. Ongoing 4 days.   . Nasal Congestion    Yellow/Green sputum.   . Sinusitis    . This visit was completed via WebEx due to the restrictions of the COVID-19 pandemic. All issues as above were discussed and addressed. Physical exam was done as above through visual confirmation on WebEx. If it was felt that the patient should be evaluated in the office, they were directed there. The patient verbally consented to this visit. . Location of the patient: home . Location of the provider: home . Those involved with this call:  . Provider: Merrie Roof, PA-C . CMA: Merilyn Baba, [place] . Front Desk/Registration: Jill Side  . Time spent on call: 15 minutes with patient face to face via video conference. More than 50% of this time was spent in counseling and coordination of care. 5 minutes total spent in review of patient's record and preparation of their chart. I verified patient identity using two factors (patient name and date of birth). Patient consents verbally to being seen via telemedicine visit today.   Was working in some mulch a week ago and the next day started having clear runny nose and since has had progressive worsening sinus pain and pressure, headaches, and productive cough of green sputum. Was given an antibiotic at UC a few days ago and some tessalon perles - started those yesterday which has not helped yet. The cough is the most bothersome symptom per patient, particularly when laying down at night. Also taking mucinex and doing sinus rinses which does seem to be helping to break things up. Denies fevers, chills, sick contacts, recent travel, CP, SOB, N/V/D.   Relevant past  medical, surgical, family and social history reviewed and updated as indicated. Interim medical history since our last visit reviewed. Allergies and medications reviewed and updated.  Review of Systems  Per HPI unless specifically indicated above     Objective:    Ht 5\' 3"  (1.6 m)   Wt 120 lb (54.4 kg)   BMI 21.26 kg/m   Wt Readings from Last 3 Encounters:  07/25/18 120 lb (54.4 kg)  07/09/18 120 lb (54.4 kg)  05/16/18 123 lb (55.8 kg)    Physical Exam Vitals signs and nursing note reviewed.  Constitutional:      General: She is not in acute distress.    Appearance: Normal appearance.  HENT:     Head: Atraumatic.     Right Ear: External ear normal.     Left Ear: External ear normal.     Nose: Congestion present.     Mouth/Throat:     Mouth: Mucous membranes are moist.     Pharynx: Oropharynx is clear. Posterior oropharyngeal erythema present.  Eyes:     Extraocular Movements: Extraocular movements intact.     Conjunctiva/sclera: Conjunctivae normal.  Neck:     Musculoskeletal: Normal range of motion.  Cardiovascular:     Comments: Unable to assess via virtual visit Pulmonary:     Effort: Pulmonary effort is normal. No respiratory distress.     Comments: Coughing frequently during visit, sounds productive No obvious wheezing,  breathing non labored Musculoskeletal: Normal range of motion.  Skin:    General: Skin is dry.     Findings: No erythema.  Neurological:     Mental Status: She is alert and oriented to person, place, and time.  Psychiatric:        Mood and Affect: Mood normal.        Thought Content: Thought content normal.        Judgment: Judgment normal.    Results for orders placed or performed during the hospital encounter of 05/13/18  Pregnancy, urine POC  Result Value Ref Range   Preg Test, Ur NEGATIVE NEGATIVE      Assessment & Plan:   Problem List Items Addressed This Visit    None    Visit Diagnoses    Acute maxillary sinusitis,  recurrence not specified    -  Primary   Complete abx given at UC, supportive care with mucinex, sinus rinses, nasal sprays. Strict return precautions given   Relevant Medications   chlorpheniramine-HYDROcodone (TUSSIONEX PENNKINETIC ER) 10-8 MG/5ML SUER   Productive cough       Declines COVID 19 testing, will quarantine. Suspect coming with sinusitis. Continue abx, tessalon. Tussionex sent       Follow up plan: Return if symptoms worsen or fail to improve.

## 2018-07-27 ENCOUNTER — Encounter: Payer: Self-pay | Admitting: Family Medicine

## 2018-07-28 ENCOUNTER — Other Ambulatory Visit: Payer: Self-pay | Admitting: Family Medicine

## 2018-07-28 MED ORDER — ALBUTEROL SULFATE HFA 108 (90 BASE) MCG/ACT IN AERS: 2.0000 | INHALATION_SPRAY | Freq: Four times a day (QID) | RESPIRATORY_TRACT | 0 refills | Status: DC | PRN

## 2018-07-28 MED ORDER — PREDNISONE 10 MG PO TABS: ORAL_TABLET | ORAL | 0 refills | Status: DC

## 2018-08-03 ENCOUNTER — Telehealth: Payer: Self-pay | Admitting: Family Medicine

## 2018-08-03 NOTE — Telephone Encounter (Signed)
Patient is calling for Referral to be placed [facility]

## 2018-08-03 NOTE — Telephone Encounter (Signed)
Patient is calling to get an appt for her back with Apolonio Schneiders. Was advise no appts where available. Patient is requesting an referral for her insurance Medicaid for another surgeon for her back. Patient states that she wanted a second opinion with an another Psychologist, sport and exercise.  Patient states she will go to [facility] walkin clinic tomorrow. And she will get the Doctors name.   Patient states she has been to [facility] and had needles injected into her leg. And it did not go well.    Please advise. Thank you

## 2018-08-03 NOTE — Telephone Encounter (Signed)
Is she just wanting a referral? If so, she can tell me where she would like to go and I will place it. If she is wanting to discuss further she will need appt

## 2018-08-04 DIAGNOSIS — M5416 Radiculopathy, lumbar region: Secondary | ICD-10-CM | POA: Diagnosis not present

## 2018-08-04 NOTE — Telephone Encounter (Signed)
This message was sent as FYI as pt stated she was going to [facility].

## 2018-08-09 DIAGNOSIS — G894 Chronic pain syndrome: Secondary | ICD-10-CM | POA: Diagnosis not present

## 2018-08-09 DIAGNOSIS — M5416 Radiculopathy, lumbar region: Secondary | ICD-10-CM | POA: Diagnosis not present

## 2018-08-09 DIAGNOSIS — Z79899 Other long term (current) drug therapy: Secondary | ICD-10-CM | POA: Diagnosis not present

## 2018-08-09 DIAGNOSIS — Z5181 Encounter for therapeutic drug level monitoring: Secondary | ICD-10-CM | POA: Diagnosis not present

## 2018-08-23 DIAGNOSIS — M5416 Radiculopathy, lumbar region: Secondary | ICD-10-CM | POA: Diagnosis not present

## 2018-08-23 DIAGNOSIS — Z79899 Other long term (current) drug therapy: Secondary | ICD-10-CM | POA: Diagnosis not present

## 2018-08-23 DIAGNOSIS — Z5181 Encounter for therapeutic drug level monitoring: Secondary | ICD-10-CM | POA: Diagnosis not present

## 2018-08-23 DIAGNOSIS — G894 Chronic pain syndrome: Secondary | ICD-10-CM | POA: Diagnosis not present

## 2018-08-28 DIAGNOSIS — M5412 Radiculopathy, cervical region: Secondary | ICD-10-CM | POA: Diagnosis not present

## 2018-08-31 DIAGNOSIS — M5412 Radiculopathy, cervical region: Secondary | ICD-10-CM | POA: Diagnosis not present

## 2018-08-31 DIAGNOSIS — M4807 Spinal stenosis, lumbosacral region: Secondary | ICD-10-CM | POA: Diagnosis not present

## 2018-08-31 DIAGNOSIS — Q762 Congenital spondylolisthesis: Secondary | ICD-10-CM | POA: Diagnosis not present

## 2018-09-01 ENCOUNTER — Encounter: Payer: Self-pay | Admitting: Family Medicine

## 2018-09-01 ENCOUNTER — Ambulatory Visit (INDEPENDENT_AMBULATORY_CARE_PROVIDER_SITE_OTHER): Payer: Medicaid Other | Admitting: Family Medicine

## 2018-09-01 ENCOUNTER — Other Ambulatory Visit: Payer: Self-pay

## 2018-09-01 DIAGNOSIS — F988 Other specified behavioral and emotional disorders with onset usually occurring in childhood and adolescence: Secondary | ICD-10-CM | POA: Diagnosis not present

## 2018-09-01 DIAGNOSIS — M5441 Lumbago with sciatica, right side: Secondary | ICD-10-CM | POA: Diagnosis not present

## 2018-09-01 DIAGNOSIS — R112 Nausea with vomiting, unspecified: Secondary | ICD-10-CM

## 2018-09-01 DIAGNOSIS — G8929 Other chronic pain: Secondary | ICD-10-CM | POA: Diagnosis not present

## 2018-09-01 DIAGNOSIS — M4306 Spondylolysis, lumbar region: Secondary | ICD-10-CM | POA: Diagnosis not present

## 2018-09-01 MED ORDER — ONDANSETRON 4 MG PO TBDP: 4.0000 mg | ORAL_TABLET | Freq: Three times a day (TID) | ORAL | 0 refills | Status: DC | PRN

## 2018-09-01 MED ORDER — AMPHETAMINE-DEXTROAMPHETAMINE 5 MG PO TABS: 5.0000 mg | ORAL_TABLET | Freq: Every day | ORAL | 0 refills | Status: DC | PRN

## 2018-09-01 MED ORDER — AMPHETAMINE-DEXTROAMPHETAMINE 20 MG PO TABS: 20.0000 mg | ORAL_TABLET | Freq: Two times a day (BID) | ORAL | 0 refills | Status: DC

## 2018-09-01 NOTE — Progress Notes (Signed)
There were no vitals taken for this visit.   Subjective:    Patient ID: Cassandra Duarte, female    DOB: 01/24/1985, 33 y.o.   MRN: [id number]  HPI: Cassandra Duarte is a 33 y.o. female  Chief Complaint  Patient presents with  . ADD    3 month f/up  . Pain    pt states she wants to discuss getting a new pain referral for her back     . This visit was completed via WebEx due to the restrictions of the COVID-19 pandemic. All issues as above were discussed and addressed. Physical exam was done as above through visual confirmation on WebEx. If it was felt that the patient should be evaluated in the office, they were directed there. The patient verbally consented to this visit. . Location of the patient: home . Location of the provider: work . Those involved with this call:  . Provider: Merrie Roof, PA-C . CMA: Merilyn Baba, [place] . Front Desk/Registration: Jill Side  . Time spent on call: 25 minutes with patient face to face via video conference. More than 50% of this time was spent in counseling and coordination of care. 5 minutes total spent in review of patient's record and preparation of their chart. I verified patient identity using two factors (patient name and date of birth). Patient consents verbally to being seen via telemedicine visit today.   Still having a lot of back issues, just got a second opinion through Neurosurgery yesterday. Pars defect with spondylolisthesis and surgery was recommended but with everything going on right now with her son's health she doesn't feel like she will be able to go through surgery in the next few years. Still going to Pain Management for her chronic pain, but wanting a referral to another location for a second opinion.   Taking 20 mg adderall daily and 5 mg in afternoons prn with excellent relief. Great benefit with focus and task completion. Denies side effects, CP, palpitations, SOB.   Still getting episodic nausea with  occasional vomiting, zofran helping but needs a refill. Has been worked up through GI.   Relevant past medical, surgical, family and social history reviewed and updated as indicated. Interim medical history since our last visit reviewed. Allergies and medications reviewed and updated.  Review of Systems  Per HPI unless specifically indicated above     Objective:    There were no vitals taken for this visit.  Wt Readings from Last 3 Encounters:  07/25/18 120 lb (54.4 kg)  07/09/18 120 lb (54.4 kg)  05/16/18 123 lb (55.8 kg)    Physical Exam Vitals signs and nursing note reviewed.  Constitutional:      General: She is not in acute distress.    Appearance: Normal appearance.  HENT:     Head: Atraumatic.     Right Ear: External ear normal.     Left Ear: External ear normal.     Nose: Nose normal. No congestion.     Mouth/Throat:     Mouth: Mucous membranes are moist.     Pharynx: Oropharynx is clear. No posterior oropharyngeal erythema.  Eyes:     Extraocular Movements: Extraocular movements intact.     Conjunctiva/sclera: Conjunctivae normal.  Neck:     Musculoskeletal: Normal range of motion.  Cardiovascular:     Comments: Unable to assess via virtual visit Pulmonary:     Effort: Pulmonary effort is normal. No respiratory distress.  Musculoskeletal: Normal range of motion.  Skin:    General: Skin is dry.     Findings: No erythema.  Neurological:     Mental Status: She is alert and oriented to person, place, and time.  Psychiatric:        Mood and Affect: Mood normal.        Thought Content: Thought content normal.        Judgment: Judgment normal.     Results for orders placed or performed during the hospital encounter of 05/13/18  Pregnancy, urine POC  Result Value Ref Range   Preg Test, Ur NEGATIVE NEGATIVE      Assessment & Plan:   Problem List Items Addressed This Visit      Nervous and Auditory   Chronic low back pain with right-sided sciatica -  Primary    Second opinion referral placed per patient request. Continue current regimen      Relevant Medications   HYDROcodone-acetaminophen (NORCO/VICODIN) 5-325 MG tablet   methocarbamol (ROBAXIN) 500 MG tablet   amphetamine-dextroamphetamine (ADDERALL) 20 MG tablet (Start on 11/01/2018)   amphetamine-dextroamphetamine (ADDERALL) 20 MG tablet (Start on 10/02/2018)   amphetamine-dextroamphetamine (ADDERALL) 20 MG tablet   amphetamine-dextroamphetamine (ADDERALL) 5 MG tablet   amphetamine-dextroamphetamine (ADDERALL) 5 MG tablet (Start on 10/02/2018)   Other Relevant Orders   Ambulatory referral to Pain Clinic   Ambulatory referral to Chiropractic     Musculoskeletal and Integument   Pars defect of lumbar spine (Chronic)   Relevant Orders   Ambulatory referral to Pain Clinic   Ambulatory referral to Chiropractic     Other   ADD (attention deficit disorder)    Stable and under good control with current regimen, continue current medications       Other Visit Diagnoses    Non-intractable vomiting with nausea, unspecified vomiting type       Zofran refilled for prn use. Continue following with GI       Follow up plan: Return in about 3 months (around 12/02/2018) for ADHD.

## 2018-09-08 NOTE — Assessment & Plan Note (Signed)
Stable and under good control with current regimen, continue current medications

## 2018-09-08 NOTE — Assessment & Plan Note (Signed)
Second opinion referral placed per patient request. Continue current regimen

## 2018-09-14 ENCOUNTER — Telehealth: Payer: Self-pay

## 2018-09-14 DIAGNOSIS — G8929 Other chronic pain: Secondary | ICD-10-CM

## 2018-09-14 DIAGNOSIS — M4306 Spondylolysis, lumbar region: Secondary | ICD-10-CM

## 2018-09-14 NOTE — Telephone Encounter (Signed)
Copied from [place] [id number]. Topic: General - Other >> Sep 13, 2018  3:16 PM [name], [place] C wrote: Reason for CRM: pt called in to ask if pain management referral could be placed at [facility] pain management instead? Pt says that it is hard to get in touch with the current location that it is set up at. Pt says that the location is requesting all of the records about pt back.   Address:[address] in [place]  Phone: [phone]  Fax: [phone]

## 2018-09-17 DIAGNOSIS — L089 Local infection of the skin and subcutaneous tissue, unspecified: Secondary | ICD-10-CM | POA: Diagnosis not present

## 2018-09-19 NOTE — Telephone Encounter (Signed)
Referral placed.

## 2018-09-29 ENCOUNTER — Encounter: Payer: Self-pay | Admitting: Family Medicine

## 2018-09-29 ENCOUNTER — Ambulatory Visit (INDEPENDENT_AMBULATORY_CARE_PROVIDER_SITE_OTHER): Payer: Medicaid Other | Admitting: Family Medicine

## 2018-09-29 ENCOUNTER — Other Ambulatory Visit: Payer: Self-pay

## 2018-09-29 VITALS — Temp 98.7°F | Ht 63.0 in

## 2018-09-29 DIAGNOSIS — N920 Excessive and frequent menstruation with regular cycle: Secondary | ICD-10-CM | POA: Diagnosis not present

## 2018-09-29 DIAGNOSIS — M5441 Lumbago with sciatica, right side: Secondary | ICD-10-CM

## 2018-09-29 DIAGNOSIS — M4306 Spondylolysis, lumbar region: Secondary | ICD-10-CM

## 2018-09-29 DIAGNOSIS — G8929 Other chronic pain: Secondary | ICD-10-CM

## 2018-09-29 MED ORDER — HYDROCODONE-ACETAMINOPHEN 5-325 MG PO TABS: 1.0000 | ORAL_TABLET | Freq: Four times a day (QID) | ORAL | 0 refills | Status: DC | PRN

## 2018-09-29 NOTE — Progress Notes (Signed)
Temp 98.7 F (37.1 C) (Oral)   Ht 5\' 3"  (1.6 m)   BMI 21.26 kg/m    Subjective:    Patient ID: Cassandra Duarte, female    DOB: May 16, 1985, 33 y.o.   MRN: [id number]  HPI: Cassandra Duarte is a 33 y.o. female  Chief Complaint  Patient presents with  . Headache  . Menstrual Problem    pt states that has had a heavy menstrual period for about 2 weeks now    . This visit was completed via WebEx due to the restrictions of the COVID-19 pandemic. All issues as above were discussed and addressed. Physical exam was done as above through visual confirmation on WebEx. If it was felt that the patient should be evaluated in the office, they were directed there. The patient verbally consented to this visit. . Location of the patient: home . Location of the provider: work . Those involved with this call:  . Provider: Merrie Roof, PA-C . CMA: Lesle Chris, [place] . Front Desk/Registration: Jill Side  . Time spent on call: 25 minutes with patient face to face via video conference. More than 50% of this time was spent in counseling and coordination of care. 5 minutes total spent in review of patient's record and preparation of their chart. I verified patient identity using two factors (patient name and date of birth). Patient consents verbally to being seen via telemedicine visit today.   Heavy menstrual period the past 2 weeks and headaches started about 4 days into that. States she's always dealt with heavy periods with her endometriosis but this time seems worse than usual. Was told by her GYN she could consider ablation if this continued. Has tried oral contraceptives in the past for this issue without benefit. Takes an iron supplement daily. Denies CP, SOB, fever, chills, melena. Hx of tubal ligation, no concern for pregnancy.   Still dealing with severe back pain that per orthopedics will require surgery to repair her lumbar spinal defect, in between pain management clinics currently  and has been weaned off by orthopedics due to missing her last appointment (was at an MRI appt for her toddler so could not come). Taking her norco sparingly as needed and using conservative home care additionally. Referral to new pain management clinic is pending.    Relevant past medical, surgical, family and social history reviewed and updated as indicated. Interim medical history since our last visit reviewed. Allergies and medications reviewed and updated.  Review of Systems  Per HPI unless specifically indicated above     Objective:    Temp 98.7 F (37.1 C) (Oral)   Ht 5\' 3"  (1.6 m)   BMI 21.26 kg/m   Wt Readings from Last 3 Encounters:  07/25/18 120 lb (54.4 kg)  07/09/18 120 lb (54.4 kg)  05/16/18 123 lb (55.8 kg)    Physical Exam Vitals signs and nursing note reviewed.  Constitutional:      General: She is not in acute distress.    Appearance: Normal appearance.  HENT:     Head: Atraumatic.     Right Ear: External ear normal.     Left Ear: External ear normal.     Nose: Nose normal. No congestion.     Mouth/Throat:     Mouth: Mucous membranes are moist.     Pharynx: Oropharynx is clear. No posterior oropharyngeal erythema.  Eyes:     Extraocular Movements: Extraocular movements intact.     Conjunctiva/sclera: Conjunctivae normal.  Neck:  Musculoskeletal: Normal range of motion.  Cardiovascular:     Comments: Unable to assess via virtual visit Pulmonary:     Effort: Pulmonary effort is normal. No respiratory distress.  Abdominal:     Comments: nontender to palpation per self abdominal exam  Musculoskeletal: Normal range of motion.  Skin:    General: Skin is dry.     Findings: No erythema.  Neurological:     Mental Status: She is alert and oriented to person, place, and time.  Psychiatric:        Mood and Affect: Mood normal.        Thought Content: Thought content normal.        Judgment: Judgment normal.     Results for orders placed or  performed during the hospital encounter of 05/13/18  Pregnancy, urine POC  Result Value Ref Range   Preg Test, Ur NEGATIVE NEGATIVE      Assessment & Plan:   Problem List Items Addressed This Visit      Nervous and Auditory   Chronic low back pain with right-sided sciatica   Relevant Medications   HYDROcodone-acetaminophen (NORCO/VICODIN) 5-325 MG tablet     Musculoskeletal and Integument   Pars defect of lumbar spine (Chronic)    Will bridge her norco until she is established with pain management, discussed with her to make this script last until that time and use sparingly with caution. Controlled substance database reviewed and appropriate        Other   Menorrhagia with regular cycle - Primary    Will obtain CBC given prolonged heavy flow and discussed f/u with GYN for next steps. Declines contraceptives at this time due to past side effects      Relevant Orders   CBC with Differential/Platelet       Follow up plan: Return for as scheduled.

## 2018-10-03 NOTE — Assessment & Plan Note (Signed)
Will bridge her norco until she is established with pain management, discussed with her to make this script last until that time and use sparingly with caution. Controlled substance database reviewed and appropriate

## 2018-10-03 NOTE — Assessment & Plan Note (Signed)
Will obtain CBC given prolonged heavy flow and discussed f/u with GYN for next steps. Declines contraceptives at this time due to past side effects

## 2018-11-02 ENCOUNTER — Telehealth: Payer: Self-pay | Admitting: Family Medicine

## 2018-11-02 MED ORDER — AMPHETAMINE-DEXTROAMPHETAMINE 5 MG PO TABS: 5.0000 mg | ORAL_TABLET | Freq: Every day | ORAL | 0 refills | Status: DC

## 2018-11-02 MED ORDER — ONDANSETRON 4 MG PO TBDP: 4.0000 mg | ORAL_TABLET | Freq: Three times a day (TID) | ORAL | 0 refills | Status: DC | PRN

## 2018-11-02 NOTE — Telephone Encounter (Signed)
°  Relation to pt: self  Call back number: [phone]  Pharmacy: [facility], [place] [phone] (Phone) [phone] (Fax)     Reason for call:  Patient requesting amphetamine-dextroamphetamine (ADDERALL) 5 MG tablet and ondansetron (ZOFRAN ODT) 4 MG disintegrating tablet, informed patient please allow 48 to 72 hour turn around time

## 2018-11-02 NOTE — Telephone Encounter (Signed)
Routing to provider  

## 2018-11-02 NOTE — Telephone Encounter (Signed)
Rx's refilled. 

## 2018-12-04 ENCOUNTER — Other Ambulatory Visit: Payer: Self-pay

## 2018-12-04 ENCOUNTER — Ambulatory Visit (INDEPENDENT_AMBULATORY_CARE_PROVIDER_SITE_OTHER): Payer: Medicaid Other | Admitting: Family Medicine

## 2018-12-04 ENCOUNTER — Encounter: Payer: Self-pay | Admitting: Family Medicine

## 2018-12-04 DIAGNOSIS — M5441 Lumbago with sciatica, right side: Secondary | ICD-10-CM | POA: Diagnosis not present

## 2018-12-04 DIAGNOSIS — G8929 Other chronic pain: Secondary | ICD-10-CM | POA: Diagnosis not present

## 2018-12-04 DIAGNOSIS — E559 Vitamin D deficiency, unspecified: Secondary | ICD-10-CM

## 2018-12-04 DIAGNOSIS — F419 Anxiety disorder, unspecified: Secondary | ICD-10-CM

## 2018-12-04 DIAGNOSIS — F988 Other specified behavioral and emotional disorders with onset usually occurring in childhood and adolescence: Secondary | ICD-10-CM | POA: Diagnosis not present

## 2018-12-04 MED ORDER — AMPHETAMINE-DEXTROAMPHETAMINE 5 MG PO TABS: 5.0000 mg | ORAL_TABLET | Freq: Every day | ORAL | 0 refills | Status: DC | PRN

## 2018-12-04 MED ORDER — AMPHETAMINE-DEXTROAMPHETAMINE 20 MG PO TABS: 20.0000 mg | ORAL_TABLET | Freq: Two times a day (BID) | ORAL | 0 refills | Status: DC

## 2018-12-04 MED ORDER — HYDROCODONE-ACETAMINOPHEN 5-325 MG PO TABS: 1.0000 | ORAL_TABLET | Freq: Four times a day (QID) | ORAL | 0 refills | Status: DC | PRN

## 2018-12-04 MED ORDER — PREDNISONE 20 MG PO TABS: 40.0000 mg | ORAL_TABLET | Freq: Every day | ORAL | 0 refills | Status: DC

## 2018-12-04 MED ORDER — BUSPIRONE HCL 5 MG PO TABS: 5.0000 mg | ORAL_TABLET | Freq: Three times a day (TID) | ORAL | 0 refills | Status: DC | PRN

## 2018-12-04 MED ORDER — METHOCARBAMOL 500 MG PO TABS: ORAL_TABLET | ORAL | 0 refills | Status: DC

## 2018-12-04 MED ORDER — ONDANSETRON 4 MG PO TBDP: 4.0000 mg | ORAL_TABLET | Freq: Three times a day (TID) | ORAL | 0 refills | Status: DC | PRN

## 2018-12-04 NOTE — Progress Notes (Signed)
There were no vitals taken for this visit.   Subjective:    Patient ID: Cassandra Duarte, female    DOB: 1986/01/03, 33 y.o.   MRN: [id number]  HPI: Cassandra Duarte is a 33 y.o. female  Chief Complaint  Patient presents with  . ADHD  . Back Pain    pt states she has aggravated her back again    . This visit was completed via WebEx due to the restrictions of the COVID-19 pandemic. All issues as above were discussed and addressed. Physical exam was done as above through visual confirmation on WebEx. If it was felt that the patient should be evaluated in the office, they were directed there. The patient verbally consented to this visit. . Location of the patient: home . Location of the provider: work . Those involved with this call:  . Provider: Merrie Roof, PA-C . CMA: Yvonna Alanis, [place] . Front Desk/Registration: Jill Side  . Time spent on call: 25 minutes with patient face to face via video conference. More than 50% of this time was spent in counseling and coordination of care. 5 minutes total spent in review of patient's record and preparation of their chart. I verified patient identity using two factors (patient name and date of birth). Patient consents verbally to being seen via telemedicine visit today.   Anxiety has been really acting up the last few months. Son was recently diagnosed with some behavioral issues which she thinks is contributing quite a bit to things. Also wondering if her chronic uncontrolled back pain may be contributing. Having some trouble breathing at times, feeling overwhelmed, thinking she's having panic attacks with the weight of everything going on with her. Denies SI/HI.   Still taking the adderall with good benefit, and denies any side effects with it. Taking the 20 mg tab BID and occasionally an extra 5 mg tab if needed in the afternoons.   Has been having to pick up her son more than usual lately and this has flared up her chronic  back issues. She is still not in a place to have surgery done but hoping to in the future as this has been told as the only option for long term pain relief. Has been trying to get into Pain Management for months now and is told that she should be able to get in until after Thanksgiving at the earliest. Trying OTC pain relievers, lidocaine patches, hot baths, ice, heating pads with minimal relief.   Relevant past medical, surgical, family and social history reviewed and updated as indicated. Interim medical history since our last visit reviewed. Allergies and medications reviewed and updated.  Review of Systems  Per HPI unless specifically indicated above     Objective:    There were no vitals taken for this visit.  Wt Readings from Last 3 Encounters:  07/25/18 120 lb (54.4 kg)  07/09/18 120 lb (54.4 kg)  05/16/18 123 lb (55.8 kg)    Physical Exam Vitals signs and nursing note reviewed.  Constitutional:      General: She is not in acute distress.    Appearance: Normal appearance.  HENT:     Head: Atraumatic.     Right Ear: External ear normal.     Left Ear: External ear normal.     Nose: Nose normal. No congestion.     Mouth/Throat:     Mouth: Mucous membranes are moist.     Pharynx: Oropharynx is clear. No posterior oropharyngeal erythema.  Eyes:  Extraocular Movements: Extraocular movements intact.     Conjunctiva/sclera: Conjunctivae normal.  Neck:     Musculoskeletal: Normal range of motion.  Cardiovascular:     Comments: Unable to assess via virtual visit Pulmonary:     Effort: Pulmonary effort is normal. No respiratory distress.  Musculoskeletal: Normal range of motion.     Comments: Laying in bed, unable to perform a good musculoskeletal exam given virtual nature and patient's pain with most movements  Skin:    General: Skin is dry.     Findings: No erythema.  Neurological:     Mental Status: She is alert and oriented to person, place, and time.  Psychiatric:         Mood and Affect: Mood normal.        Thought Content: Thought content normal.        Judgment: Judgment normal.     Results for orders placed or performed during the hospital encounter of 05/13/18  Pregnancy, urine POC  Result Value Ref Range   Preg Test, Ur NEGATIVE NEGATIVE      Assessment & Plan:   Problem List Items Addressed This Visit      Nervous and Auditory   Chronic low back pain with right-sided sciatica    Will give 5 day supply of norco for prn use for severe back pain as she's still waiting on getting in with the pain clinic. Controlled substance database reviewed. Continue supportive care      Relevant Medications   busPIRone (BUSPAR) 5 MG tablet   methocarbamol (ROBAXIN) 500 MG tablet   HYDROcodone-acetaminophen (NORCO/VICODIN) 5-325 MG tablet   predniSONE (DELTASONE) 20 MG tablet   amphetamine-dextroamphetamine (ADDERALL) 5 MG tablet (Start on 02/03/2019)   amphetamine-dextroamphetamine (ADDERALL) 20 MG tablet (Start on 02/03/2019)   amphetamine-dextroamphetamine (ADDERALL) 5 MG tablet (Start on 01/03/2019)   amphetamine-dextroamphetamine (ADDERALL) 20 MG tablet (Start on 01/03/2019)   amphetamine-dextroamphetamine (ADDERALL) 20 MG tablet   amphetamine-dextroamphetamine (ADDERALL) 5 MG tablet     Other   ADD (attention deficit disorder)    Stable and under good control, continue current regimen       Other Visit Diagnoses    Anxiety    -  Primary   Will start prn buspar and refer to counseling. F/u in 1 month for recheck   Relevant Medications   busPIRone (BUSPAR) 5 MG tablet   Other Relevant Orders   Ambulatory referral to Psychology   Vitamin D deficiency       Relevant Orders   Vit D  25 hydroxy (rtn osteoporosis monitoring)       Follow up plan: Return in about 4 weeks (around 01/01/2019) for Anxiety f/u.

## 2018-12-05 ENCOUNTER — Other Ambulatory Visit: Payer: Self-pay | Admitting: Family Medicine

## 2018-12-05 ENCOUNTER — Encounter: Payer: Self-pay | Admitting: Family Medicine

## 2018-12-05 MED ORDER — OXYCODONE HCL 5 MG PO TABS: 5.0000 mg | ORAL_TABLET | Freq: Three times a day (TID) | ORAL | 0 refills | Status: DC | PRN

## 2018-12-08 ENCOUNTER — Encounter: Payer: Self-pay | Admitting: Family Medicine

## 2018-12-11 ENCOUNTER — Other Ambulatory Visit: Payer: Self-pay | Admitting: Family Medicine

## 2018-12-11 MED ORDER — OXYCODONE HCL 5 MG PO TABS: 5.0000 mg | ORAL_TABLET | Freq: Two times a day (BID) | ORAL | 0 refills | Status: DC | PRN

## 2018-12-11 NOTE — Assessment & Plan Note (Signed)
Stable and under good control, continue current regimen 

## 2018-12-11 NOTE — Assessment & Plan Note (Signed)
Will give 5 day supply of norco for prn use for severe back pain as she's still waiting on getting in with the pain clinic. Controlled substance database reviewed. Continue supportive care

## 2018-12-25 DIAGNOSIS — S6722XA Crushing injury of left hand, initial encounter: Secondary | ICD-10-CM | POA: Diagnosis not present

## 2018-12-26 ENCOUNTER — Encounter: Payer: Self-pay | Admitting: Family Medicine

## 2018-12-28 ENCOUNTER — Encounter: Payer: Self-pay | Admitting: Family Medicine

## 2018-12-28 ENCOUNTER — Other Ambulatory Visit: Payer: Self-pay

## 2018-12-28 ENCOUNTER — Ambulatory Visit (INDEPENDENT_AMBULATORY_CARE_PROVIDER_SITE_OTHER): Payer: Medicaid Other | Admitting: Family Medicine

## 2018-12-28 VITALS — Ht 63.0 in | Wt 123.0 lb

## 2018-12-28 DIAGNOSIS — J0100 Acute maxillary sinusitis, unspecified: Secondary | ICD-10-CM | POA: Diagnosis not present

## 2018-12-28 DIAGNOSIS — M25539 Pain in unspecified wrist: Secondary | ICD-10-CM | POA: Diagnosis not present

## 2018-12-28 MED ORDER — OXYCODONE HCL 5 MG PO TABS: 5.0000 mg | ORAL_TABLET | Freq: Two times a day (BID) | ORAL | 0 refills | Status: DC | PRN

## 2018-12-28 MED ORDER — AMOXICILLIN-POT CLAVULANATE 875-125 MG PO TABS: 1.0000 | ORAL_TABLET | Freq: Two times a day (BID) | ORAL | 0 refills | Status: DC

## 2018-12-28 NOTE — Progress Notes (Signed)
Ht 5\' 3"  (1.6 m)   Wt 123 lb (55.8 kg)   BMI 21.79 kg/m    Subjective:    Patient ID: Cassandra Duarte, female    DOB: 10-05-1985, 33 y.o.   MRN: [id number]  HPI: Cassandra Duarte is a 33 y.o. female  Chief Complaint  Patient presents with  . Sinusitis    x about a week    . This visit was completed via WebEx due to the restrictions of the COVID-19 pandemic. All issues as above were discussed and addressed. Physical exam was done as above through visual confirmation on WebEx. If it was felt that the patient should be evaluated in the office, they were directed there. The patient verbally consented to this visit. . Location of the patient: home . Location of the provider: work . Those involved with this call:  . Provider: Merrie Roof, PA-C . CMA: Lesle Chris, [place] . Front Desk/Registration: Jill Side  . Time spent on call: 25 minutes with patient face to face via video conference. More than 50% of this time was spent in counseling and coordination of care. 5 minutes total spent in review of patient's record and preparation of their chart. I verified patient identity using two factors (patient name and date of birth). Patient consents verbally to being seen via telemedicine visit today.   Patient presenting today for sinus pain and pressure, congestion, thick green mucus, sinus headaches x 1 week. Taking mucinex D with minimal relief. Denies fevers, chills, CP, SOB, cough, sick contacts, recent travel. Does have a hx of seasonal allergies, not consistently on any regimen for those.   [place] and broke wrist the other day per patient and was given some oxycodone by [facility] for injury. Still waiting to get in with Pain Clinic and would like a few more pain tablets to get her through the worst of the injury. Will be following up with [facility] on the injury.   Relevant past medical, surgical, family and social history reviewed and updated as indicated.  Interim medical history since our last visit reviewed. Allergies and medications reviewed and updated.  Review of Systems  Per HPI unless specifically indicated above     Objective:    Ht 5\' 3"  (1.6 m)   Wt 123 lb (55.8 kg)   BMI 21.79 kg/m   Wt Readings from Last 3 Encounters:  12/28/18 123 lb (55.8 kg)  07/25/18 120 lb (54.4 kg)  07/09/18 120 lb (54.4 kg)    Physical Exam Vitals and nursing note reviewed.  Constitutional:      General: She is not in acute distress.    Appearance: Normal appearance.  HENT:     Head: Atraumatic.     Right Ear: External ear normal.     Left Ear: External ear normal.     Nose: Congestion present.     Mouth/Throat:     Mouth: Mucous membranes are moist.     Pharynx: Oropharynx is clear. Posterior oropharyngeal erythema present.  Eyes:     Extraocular Movements: Extraocular movements intact.     Conjunctiva/sclera: Conjunctivae normal.  Cardiovascular:     Comments: Unable to assess via virtual visit Pulmonary:     Effort: Pulmonary effort is normal. No respiratory distress.  Musculoskeletal:        General: Normal range of motion.     Cervical back: Normal range of motion.  Skin:    General: Skin is dry.     Findings: No  erythema.  Neurological:     Mental Status: She is alert and oriented to person, place, and time.  Psychiatric:        Mood and Affect: Mood normal.        Thought Content: Thought content normal.        Judgment: Judgment normal.     Results for orders placed or performed during the hospital encounter of 05/13/18  Pregnancy, urine POC  Result Value Ref Range   Preg Test, Ur NEGATIVE NEGATIVE      Assessment & Plan:   Problem List Items Addressed This Visit    None    Visit Diagnoses    Acute maxillary sinusitis, recurrence not specified    -  Primary   Pt declining COVID testing, will isolate instead. Augmentin, mucinex, sinus rinses, good allergy regimen. F/u if worsening or not improving   Relevant  Medications   amoxicillin-clavulanate (AUGMENTIN) 875-125 MG tablet   Pain in wrist, unspecified laterality       Will write for small additional amount of pain medication but patient aware no more until est with Pain Management. Continue per Orthopedic recommendations       Follow up plan: Return for as scheduled.

## 2019-01-01 ENCOUNTER — Telehealth: Payer: Self-pay | Admitting: Family Medicine

## 2019-01-01 NOTE — Telephone Encounter (Signed)
Pt states her child's daycare shut done due to covid.  She is going to go to the beach and stay with her grandmother who is to help with her 33 yr old. Pt is requesting she pick up her adderall early today or in the am. (she is leaving tomorrow) Due 16th. Pt does not want to drive back here to pick up.  She will be there until after christmas.   [facility] [place], [place] [facility] Phone:  [phone]  Fax:  [phone]

## 2019-01-01 NOTE — Telephone Encounter (Signed)
OK to fill early. Can the pharmacy take a verbal on this or do I need to send new script?

## 2019-01-01 NOTE — Telephone Encounter (Signed)
Pharmacy notified. Pharmacist said medicaid may not pay for medication because it's early. Spoke with patient. Patient said she talked to Medicaid earlier and stated that they'll pay if it's 2 days early, no other length of time. Still explained Pharmacist said he couldn't guarantee they would patient. Patient notified and verbalized understanding.

## 2019-01-01 NOTE — Telephone Encounter (Signed)
Routing to provider to advise.  

## 2019-01-05 ENCOUNTER — Encounter: Payer: Self-pay | Admitting: Family Medicine

## 2019-01-05 DIAGNOSIS — Z20828 Contact with and (suspected) exposure to other viral communicable diseases: Secondary | ICD-10-CM | POA: Diagnosis not present

## 2019-01-05 DIAGNOSIS — R0981 Nasal congestion: Secondary | ICD-10-CM | POA: Diagnosis not present

## 2019-01-08 ENCOUNTER — Telehealth: Payer: Self-pay | Admitting: Family Medicine

## 2019-01-08 DIAGNOSIS — M4306 Spondylolysis, lumbar region: Secondary | ICD-10-CM

## 2019-01-08 DIAGNOSIS — G8929 Other chronic pain: Secondary | ICD-10-CM

## 2019-01-08 NOTE — Telephone Encounter (Signed)
Patient is calling for referral for Pain Management. [facility] was not accepting new patients. The patient has reached out to the following Doctor in [place] that could see her in January. The patient is requesting a referral to be placed with following doctor please advise  [facility] [address] Michiel Sites [place], [place]. [address] [phone]  Patient is requesting Demographic information and OV notes regarding the patient's back to be forwarded over. Thanks

## 2019-01-08 NOTE — Telephone Encounter (Signed)
Responded earlier via mychart regarding her pain  Copied from [place] [phone]. Topic: General - Call Back - No Documentation >> Jan 05, 2019  3:45 PM Erick Blinks wrote: Reason for CRM: Pt is requesting something for pain, pt is in a lot pain. Please advise.

## 2019-01-09 ENCOUNTER — Other Ambulatory Visit: Payer: Self-pay

## 2019-01-09 ENCOUNTER — Encounter: Payer: Self-pay | Admitting: Family Medicine

## 2019-01-09 ENCOUNTER — Ambulatory Visit (INDEPENDENT_AMBULATORY_CARE_PROVIDER_SITE_OTHER): Payer: Medicaid Other | Admitting: Family Medicine

## 2019-01-09 DIAGNOSIS — F419 Anxiety disorder, unspecified: Secondary | ICD-10-CM | POA: Diagnosis not present

## 2019-01-09 MED ORDER — BUSPIRONE HCL 5 MG PO TABS: 5.0000 mg | ORAL_TABLET | Freq: Three times a day (TID) | ORAL | 0 refills | Status: DC | PRN

## 2019-01-09 NOTE — Telephone Encounter (Signed)
Routing to provider  

## 2019-01-09 NOTE — Progress Notes (Signed)
Wt 123 lb (55.8 kg)   BMI 21.79 kg/m    Subjective:    Patient ID: Cassandra Duarte, female    DOB: 07/22/1985, 33 y.o.   MRN: [id number]  HPI: Cassandra Duarte is a 33 y.o. female  Chief Complaint  Patient presents with  . Anxiety    . This visit was completed via WebEx due to the restrictions of the COVID-19 pandemic. All issues as above were discussed and addressed. Physical exam was done as above through visual confirmation on WebEx. If it was felt that the patient should be evaluated in the office, they were directed there. The patient verbally consented to this visit. . Location of the patient: home . Location of the provider: work . Those involved with this call:  . Provider: Merrie Roof, PA-C . CMA: Lesle Chris, [place] . Front Desk/Registration: Jill Side  . Time spent on call: 15 minutes with patient face to face via video conference. More than 50% of this time was spent in counseling and coordination of care. 5 minutes total spent in review of patient's record and preparation of their chart. I verified patient identity using two factors (patient name and date of birth). Patient consents verbally to being seen via telemedicine visit today.   Patient presenting today for 1 month anxiety f/u after starting buspar TID prn. Tolerating it well as far as she can tell, but unable to fully tell how much it's helping due to so many things going on the past month. Has been sick with a sinus infection the majority of the time and also recently fractured her wrist and has been in significant pain from that incident. Still feeling quite anxious and stressed but thinks maybe it's helping some. Denies side effects, SI/HI, sever mood swings or agitation.   Depression screen [facility] 2/9 01/09/2019 06/29/2017 05/26/2017  Decreased Interest 1 0 0  Down, Depressed, Hopeless 0 0 0  PHQ - 2 Score 1 0 0  Altered sleeping 2 - 1  Tired, decreased energy - - 2  Change in appetite - - 0    Feeling bad or failure about yourself  - - 0  Trouble concentrating - - 3  Moving slowly or fidgety/restless - - 0  Suicidal thoughts - - 0  PHQ-9 Score 3 - 6   GAD 7 : Generalized Anxiety Score 05/26/2017  Nervous, Anxious, on Edge 1  Control/stop worrying 2  Worry too much - different things 3  Trouble relaxing 1  Restless 1  Easily annoyed or irritable 1  Afraid - awful might happen 0  Total GAD 7 Score 9  Anxiety Difficulty Somewhat difficult   Relevant past medical, surgical, family and social history reviewed and updated as indicated. Interim medical history since our last visit reviewed. Allergies and medications reviewed and updated.  Review of Systems  Per HPI unless specifically indicated above     Objective:    Wt 123 lb (55.8 kg)   BMI 21.79 kg/m   Wt Readings from Last 3 Encounters:  01/09/19 123 lb (55.8 kg)  12/28/18 123 lb (55.8 kg)  07/25/18 120 lb (54.4 kg)    Physical Exam Vitals and nursing note reviewed.  Constitutional:      General: She is not in acute distress.    Appearance: Normal appearance.  HENT:     Head: Atraumatic.     Right Ear: External ear normal.     Left Ear: External ear normal.     Nose:  Nose normal. No congestion.     Mouth/Throat:     Mouth: Mucous membranes are moist.     Pharynx: Oropharynx is clear. No posterior oropharyngeal erythema.  Eyes:     Extraocular Movements: Extraocular movements intact.     Conjunctiva/sclera: Conjunctivae normal.  Cardiovascular:     Comments: Unable to assess via virtual visit Pulmonary:     Effort: Pulmonary effort is normal. No respiratory distress.  Musculoskeletal:        General: Normal range of motion.     Cervical back: Normal range of motion.  Skin:    General: Skin is dry.     Findings: No erythema.  Neurological:     Mental Status: She is alert and oriented to person, place, and time.  Psychiatric:        Mood and Affect: Mood normal.        Thought Content: Thought  content normal.        Judgment: Judgment normal.     Results for orders placed or performed during the hospital encounter of 05/13/18  Pregnancy, urine POC  Result Value Ref Range   Preg Test, Ur NEGATIVE NEGATIVE      Assessment & Plan:   Problem List Items Addressed This Visit      Other   Anxiety    Potentially getting some benefit from buspar, but with so many new issues the past month difficult to tell fully. Will renew for 1 more month and reassess at that time      Relevant Medications   busPIRone (BUSPAR) 5 MG tablet       Follow up plan: Return in about 4 weeks (around 02/06/2019) for anxiety f/u.

## 2019-01-10 NOTE — Telephone Encounter (Signed)
Referral placed.

## 2019-01-13 DIAGNOSIS — W08XXXA Fall from other furniture, initial encounter: Secondary | ICD-10-CM | POA: Diagnosis not present

## 2019-01-13 DIAGNOSIS — Y92019 Unspecified place in single-family (private) house as the place of occurrence of the external cause: Secondary | ICD-10-CM | POA: Diagnosis not present

## 2019-01-13 DIAGNOSIS — G8911 Acute pain due to trauma: Secondary | ICD-10-CM | POA: Diagnosis not present

## 2019-01-13 DIAGNOSIS — M898X1 Other specified disorders of bone, shoulder: Secondary | ICD-10-CM | POA: Diagnosis not present

## 2019-01-13 DIAGNOSIS — S4992XA Unspecified injury of left shoulder and upper arm, initial encounter: Secondary | ICD-10-CM | POA: Diagnosis not present

## 2019-01-13 DIAGNOSIS — M25512 Pain in left shoulder: Secondary | ICD-10-CM | POA: Diagnosis not present

## 2019-01-15 DIAGNOSIS — F419 Anxiety disorder, unspecified: Secondary | ICD-10-CM | POA: Insufficient documentation

## 2019-01-15 NOTE — Assessment & Plan Note (Signed)
Potentially getting some benefit from buspar, but with so many new issues the past month difficult to tell fully. Will renew for 1 more month and reassess at that time

## 2019-01-22 ENCOUNTER — Other Ambulatory Visit: Payer: Self-pay

## 2019-01-22 ENCOUNTER — Encounter: Payer: Self-pay | Admitting: Medical Oncology

## 2019-01-22 ENCOUNTER — Ambulatory Visit: Payer: Self-pay

## 2019-01-22 ENCOUNTER — Emergency Department: Payer: Medicaid Other

## 2019-01-22 ENCOUNTER — Emergency Department
Admission: EM | Admit: 2019-01-22 | Discharge: 2019-01-22 | Disposition: A | Discharge status: Home / Self Care | Payer: Medicaid Other | Attending: Student | Admitting: Student

## 2019-01-22 DIAGNOSIS — K824 Cholesterolosis of gallbladder: Secondary | ICD-10-CM | POA: Diagnosis not present

## 2019-01-22 DIAGNOSIS — Z79899 Other long term (current) drug therapy: Secondary | ICD-10-CM | POA: Diagnosis not present

## 2019-01-22 DIAGNOSIS — K769 Liver disease, unspecified: Secondary | ICD-10-CM | POA: Diagnosis not present

## 2019-01-22 DIAGNOSIS — R1011 Right upper quadrant pain: Secondary | ICD-10-CM | POA: Diagnosis not present

## 2019-01-22 DIAGNOSIS — K8020 Calculus of gallbladder without cholecystitis without obstruction: Secondary | ICD-10-CM | POA: Diagnosis not present

## 2019-01-22 DIAGNOSIS — R1110 Vomiting, unspecified: Secondary | ICD-10-CM | POA: Insufficient documentation

## 2019-01-22 DIAGNOSIS — K8050 Calculus of bile duct without cholangitis or cholecystitis without obstruction: Secondary | ICD-10-CM

## 2019-01-22 DIAGNOSIS — K7689 Other specified diseases of liver: Secondary | ICD-10-CM | POA: Insufficient documentation

## 2019-01-22 DIAGNOSIS — Z87891 Personal history of nicotine dependence: Secondary | ICD-10-CM | POA: Diagnosis not present

## 2019-01-22 LAB — URINALYSIS, COMPLETE (UACMP) WITH MICROSCOPIC
Bacteria, UA: NONE SEEN
Bilirubin Urine: NEGATIVE
Glucose, UA: NEGATIVE mg/dL
Hgb urine dipstick: NEGATIVE
Ketones, ur: NEGATIVE mg/dL
Leukocytes,Ua: NEGATIVE
Nitrite: NEGATIVE
Protein, ur: NEGATIVE mg/dL
Specific Gravity, Urine: 1.029 (ref 1.005–1.030)
pH: 5 (ref 5.0–8.0)

## 2019-01-22 LAB — CBC
HCT: 33.1 % — ABNORMAL LOW (ref 36.0–46.0)
Hemoglobin: 11.8 g/dL — ABNORMAL LOW (ref 12.0–15.0)
MCH: 30.3 pg (ref 26.0–34.0)
MCHC: 35.6 g/dL (ref 30.0–36.0)
MCV: 85.1 fL (ref 80.0–100.0)
Platelets: 180 10*3/uL (ref 150–400)
RBC: 3.89 MIL/uL (ref 3.87–5.11)
RDW: 12.1 % (ref 11.5–15.5)
WBC: 9 10*3/uL (ref 4.0–10.5)
nRBC: 0 % (ref 0.0–0.2)

## 2019-01-22 LAB — LIPASE, BLOOD: Lipase: 27 U/L (ref 11–51)

## 2019-01-22 LAB — COMPREHENSIVE METABOLIC PANEL
ALT: 17 U/L (ref 0–44)
AST: 18 U/L (ref 15–41)
Albumin: 4 g/dL (ref 3.5–5.0)
Alkaline Phosphatase: 51 U/L (ref 38–126)
Anion gap: 9 (ref 5–15)
BUN: 20 mg/dL (ref 6–20)
CO2: 25 mmol/L (ref 22–32)
Calcium: 8.7 mg/dL — ABNORMAL LOW (ref 8.9–10.3)
Chloride: 104 mmol/L (ref 98–111)
Creatinine, Ser: 0.61 mg/dL (ref 0.44–1.00)
GFR calc Af Amer: >60 mL/min (ref >60)
GFR calc non Af Amer: >60 mL/min (ref >60)
Glucose, Bld: 122 mg/dL — ABNORMAL HIGH (ref 70–99)
Potassium: 3.6 mmol/L (ref 3.5–5.1)
Sodium: 138 mmol/L (ref 135–145)
Total Bilirubin: 0.3 mg/dL (ref 0.3–1.2)
Total Protein: 6.7 g/dL (ref 6.5–8.1)

## 2019-01-22 LAB — POCT PREGNANCY, URINE: Preg Test, Ur: NEGATIVE

## 2019-01-22 MED ORDER — OMEPRAZOLE 20 MG PO CPDR: 20.0000 mg | DELAYED_RELEASE_CAPSULE | Freq: Two times a day (BID) | ORAL | 0 refills | Status: DC

## 2019-01-22 NOTE — Discharge Instructions (Addendum)
Thank you for letting us take care of you in the emergency department today.   Please continue to take any regular, prescribed medications.   Please follow up with: - Your primary care doctor to review your ER visit and follow up on your symptoms.  - General surgery doctor, information below - GI doctor, information below  Please return to the ER for any new or worsening symptoms.

## 2019-01-22 NOTE — Telephone Encounter (Signed)
FYI for [employer].

## 2019-01-22 NOTE — ED Provider Notes (Signed)
[facility] Emergency Department Provider Note  ____________________________________________   First MD Initiated Contact with Patient 01/22/19 1201     (approximate)  I have reviewed the triage vital signs and the nursing notes.  History  Chief Complaint Flank Pain, Abdominal Pain, and Emesis    HPI Cassandra Duarte is a 34 y.o. female who presents emergency department for right upper quadrant pain.  Patient states on Saturday she ate a big, greasy burger.  After this she developed nausea and several episodes of nonbloody, nonbilious emesis.  She has had right upper quadrant abdominal pain since then. Pain is constant with intermittent periods of increased severity. Pain radiates somewhat to her back.  She rates her pain as an 8/10 in severity.  No alleviating, aggravating factors.  Describes it as aching, dullness, sometimes sharp.  Patient states she has had intermittent episodes of the same pain that have been ongoing for years.  This episode, however, seemed more severe, which prompted evaluation.  She is concerned that her gallbladder may be the etiology.  She denies any diarrhea.  No fevers.  No dysuria, hematuria.  She does have a history of nephrolithiasis and states that this does not feel similarly.   Past Medical Hx Past Medical History:  Diagnosis Date  . Family history of adverse reaction to anesthesia    dad-n/v  . History of kidney stones    h/o  . Vaginal Pap smear, abnormal 2012   REPEAT AND WAS NORMAL    Problem List Patient Active Problem List   Diagnosis Date Noted  . Anxiety 01/15/2019  . Controlled substance agreement signed 11/29/2017  . Dysuria 10/10/2017  . Status post laparoscopy with adhesio lysis 07/13/2017  . Chronic pain syndrome 06/29/2017  . 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 Myofascial pain 06/29/2017  . ADD (attention deficit disorder) 05/26/2017  . Menorrhagia with regular cycle 05/03/2017  .  Dysmenorrhea 05/03/2017  . Overweight 10/01/2015  . Lactational amenorrhea 10/01/2015  . Family history of breast cancer 10/01/2015  . 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 DILATION AND CURETTAGE OF UTERUS  2014   [facility]  . LAPAROSCOPIC LYSIS OF ADHESIONS  06/20/2017   Procedure: LAPAROSCOPIC LYSIS OF ADHESIONS;  Surgeon: Brayton Mars, MD;  Location: [facility] ORS;  Service: Gynecology;;  . LAPAROSCOPY N/A 06/20/2017   Procedure: OPERATIVE DIAGNOSTIC WITH BIOPSIES;  Surgeon: Brayton Mars, MD;  Location: [facility] ORS;  Service: Gynecology;  Laterality: N/A;  . TUBAL LIGATION Bilateral 02/21/2015   Procedure: BILATERAL TUBAL LIGATION;  Surgeon: Brayton Mars, MD;  Location: [facility] ORS;  Service: Gynecology;  Laterality: Bilateral;    Medications Prior to Admission medications   Medication Sig Start Date End Date Taking? Authorizing Provider  amphetamine-dextroamphetamine (ADDERALL) 20 MG tablet Take 1 tablet (20 mg total) by mouth 2 (two) times daily. Patient not taking: Reported on 12/28/2018 02/03/19   Volney American, PA-C  amphetamine-dextroamphetamine (ADDERALL) 20 MG tablet Take 1 tablet (20 mg total) by mouth 2 (two) times daily. 01/03/19   Volney American, PA-C  amphetamine-dextroamphetamine (ADDERALL) 5 MG tablet Take 1 tablet (5 mg total) by mouth daily as needed. 02/03/19   Volney American, PA-C  amphetamine-dextroamphetamine (ADDERALL) 5 MG tablet Take 1 tablet (5 mg total) by mouth daily as needed. Patient not taking: Reported on 01/09/2019 01/03/19   Volney American, PA-C  busPIRone (  BUSPAR) 5 MG tablet Take 1 tablet (5 mg total) by mouth 3 (three) times daily as needed. 01/09/19   Volney American, PA-C  IBUPROFEN PO Take by mouth as needed.    [provider]  methocarbamol (ROBAXIN) 500 MG tablet methocarbamol 500 mg tablet  TK 1 T PO  TID AS MUSCLE RELAXER 12/04/18   Volney American, PA-C  Multiple Vitamin (MULTIVITAMIN PO) Take by mouth daily.    [provider]  ondansetron (ZOFRAN ODT) 4 MG disintegrating tablet Take 1 tablet (4 mg total) by mouth every 8 (eight) hours as needed. 12/04/18   Volney American, PA-C  VITAMIN D PO Take by mouth once a week.    [provider]    Allergies Magnesium-containing compounds, Toradol [ketorolac tromethamine], Tamiflu [oseltamivir phosphate], and Tramadol  Family Hx Family History  Problem Relation Age of Onset  . Breast cancer Paternal Aunt   . Diabetes Paternal Aunt   . Diabetes Maternal Grandfather   . COPD Maternal Grandmother   . Cancer Maternal Grandmother        lung  . Colon cancer Neg Hx   . Ovarian cancer Neg Hx   . Heart disease Neg Hx     Social Hx Social History   Tobacco Use  . Smoking status: Former Smoker    Packs/day: 0.50    Years: 4.00    Pack years: 2.00    Types: Cigarettes    Quit date: 04/02/2014    Years since quitting: 4.8  . Smokeless tobacco: Never Used  Substance Use Topics  . Alcohol use: No    Alcohol/week: 0.0 standard drinks  . Drug use: No     Review of Systems  Constitutional: Negative for fever, chills. Eyes: Negative for visual changes. ENT: Negative for sore throat. Cardiovascular: Negative for chest pain. Respiratory: Negative for shortness of breath. Gastrointestinal: + for nausea, vomiting, abdominal pain.  Genitourinary: Negative for dysuria. Musculoskeletal: Negative for leg swelling. Skin: Negative for rash. Neurological: Negative for for headaches.   Physical Exam  Vital Signs: ED Triage Vitals  Enc Vitals Group     BP 01/22/19 1035 (!) 136/91     Pulse Rate 01/22/19 1035 74     Resp 01/22/19 1035 18     Temp 01/22/19 1035 98.2 F (36.8 C)     Temp Source 01/22/19 1035 Oral     SpO2 01/22/19 1035 100 %     Weight 01/22/19 1036 118 lb (53.5 kg)     Height 01/22/19  1036 5\' 3"  (1.6 m)     Head Circumference --      Peak Flow --      Pain Score 01/22/19 1047 8     Pain Loc --      Pain Edu? --      Excl. in [place]? --     Constitutional: Alert and oriented.  Head: Normocephalic. Atraumatic. Eyes: Conjunctivae clear. Sclera anicteric. Nose: No congestion. No rhinorrhea. Mouth/Throat: Wearing mask.  Neck: No stridor.   Cardiovascular: Normal rate, regular rhythm. Extremities well perfused. Respiratory: Normal respiratory effort.  Lungs CTAB. Gastrointestinal: Soft. Mild TTP in RUQ and mid abdomen, no rebound or guarding. Remainder of abdomen is soft and non-tender.  Musculoskeletal: No lower extremity edema. No deformities. Neurologic:  Normal speech and language. No gross focal neurologic deficits are appreciated.  Skin: Skin is warm, dry and intact. No rash noted. Psychiatric: Mood and affect are appropriate for situation.  EKG  N/A  Radiology  [place] RUQ: IMPRESSION:  1. Gallbladder polyps, largest measuring 6 mm in length. Per  consensus guidelines, polyps of this size do not warrant additional  imaging surveillance.   2. Gallbladder is somewhat contracted. Note that patient has not  been postprandial. No gallstones or overt gallbladder wall  thickening seen. If there remains concern for gallbladder pathology  beyond polyps, repeat study after minimum of 8 hours fasting may be  reasonable.   3. Mass lesion in right lobe of liver, suspected hemangioma. This  lesion was also present on previous CT. This lesion does not have  classic hemangioma appearance by ultrasound. Given this  circumstance, it may be prudent to consider follow-up ultrasound of  the liver in 1 year to assess for stability. No new liver lesions  are demonstrable on this study.    Procedures  Procedure(s) performed (including critical care):  Procedures   Initial Impression / Assessment and Plan / ED Course  34 y.o. female who presents to the ED for RUQ pain,  nausea, vomiting. History of similar, intermittent symptoms in the past.  Ddx: biliary colic, cholecystitis, pancreatitis, gastritis  Will obtain labs, ultrasound, reassess.  Bilirubin, LFTs, lipase within normal limits.  Negative urine pregnancy test. No blood to suggest stone.   [place] with gallbladder polyps, no stones or gallbladder wall thickening. Mass lesion noted of the liver, present on prior CT. Patient's pain improved and able to tolerate PO here. Will plan for follow up with General Surgery to discuss her likely biliary colic as well as GI re: liver findings. Rx for PPI, given GERD diet instructions. Patient comfortable w/ plan and discharge. Given return precautions.    Final Clinical Impression(s) / ED Diagnosis  Final diagnoses:  RUQ abdominal pain  Biliary colic  Liver lesion       Note:  This document was prepared using Dragon voice recognition software and may include unintentional dictation errors.   Lilia Pro., MD 01/22/19 1455

## 2019-01-22 NOTE — ED Notes (Signed)
ED Provider Monks at bedside. 

## 2019-01-22 NOTE — ED Notes (Signed)
Pt c/o right flank pain that has been going on for years but reports it is consistent the past for days. Also reports right sided back pain and believes her gallbladder is the problem

## 2019-01-22 NOTE — Telephone Encounter (Signed)
Patient called from ER stating that she has had right abdominal pain since Saturday night.  She states it started with feeling like she needed that bathroom.  She states that the pain at first would come and go but is now constant at 7-10. She has been sick to her stomach and vomiting since sometime Saturday night. The pain travels to her back. She is at the ER and had had blood drawn but was calling to be seen at the office instead of ER. While speaking with me she received a call to go to a room in ER. She will stay now and be seen in ER for her symptom. No care advice read. Triage was completed and indicated patient needed to go/stay and be seen in ER. Will route note to office.  Reason for Disposition . [1] SEVERE pain (e.g., excruciating) AND [2] present > 1 hour  Answer Assessment - Initial Assessment Questions 1. LOCATION: "Where does it hurt?"      Mid rt abdomin pain  2. RADIATION: "Does the pain shoot anywhere else?" (e.g., chest, back)     Into back 3. ONSET: "When did the pain begin?" (e.g., minutes, hours or days ago)      Saturday 4. SUDDEN: "Gradual or sudden onset?"     sudden 5. PATTERN "Does the pain come and go, or is it constant?"    - If constant: "Is it getting better, staying the same, or worsening?"      (Note: Constant means the pain never goes away completely; most serious pain is constant and it progresses)     - If intermittent: "How long does it last?" "Do you have pain now?"     (Note: Intermittent means the pain goes away completely between bouts)    Constant now 6. SEVERITY: "How bad is the pain?"  (e.g., Scale 1-10; mild, moderate, or severe)   - MILD (1-3): doesn't interfere with normal activities, abdomen soft and not tender to touch    - MODERATE (4-7): interferes with normal activities or awakens from sleep, tender to touch    - SEVERE (8-10): excruciating pain, doubled over, unable to do any normal activities      7-10 7. RECURRENT SYMPTOM: "Have you  ever had this type of abdominal pain before?" If so, ask: "When was the last time?" and "What happened that time?"     Symptoms have been ongoing but not this bad 8. CAUSE: "What do you think is causing the abdominal pain?"     unsure 9. RELIEVING/AGGRAVATING FACTORS: "What makes it better or worse?" (e.g., movement, antacids, bowel movement)     nothing 10. OTHER SYMPTOMS: "Has there been any vomiting, diarrhea, constipation, or urine problems?"      Vomiting since Saturday night 11. PREGNANCY: "Is there any chance you are pregnant?" "When was your last menstrual period?"       No tubal  Protocols used: ABDOMINAL PAIN - [facility]

## 2019-01-22 NOTE — ED Notes (Signed)
Pt sitting in car to wait- [phone]

## 2019-01-22 NOTE — ED Notes (Signed)
POC was NEGATIVE.

## 2019-01-22 NOTE — ED Triage Notes (Signed)
PT reports that she began last week having flank pain that radiates around to RUQ. Pt also reports that she has been having NV.

## 2019-01-26 ENCOUNTER — Encounter: Payer: Self-pay | Admitting: Family Medicine

## 2019-01-26 ENCOUNTER — Other Ambulatory Visit: Payer: Self-pay | Admitting: Family Medicine

## 2019-01-26 MED ORDER — ONDANSETRON 4 MG PO TBDP: 4.0000 mg | ORAL_TABLET | Freq: Three times a day (TID) | ORAL | 0 refills | Status: DC | PRN

## 2019-01-26 NOTE — Telephone Encounter (Signed)
Medication Refill - Medication: ondansetron (ZOFRAN ODT) 4 MG disintegrating tablet  Pt goes to see a surgeon next week in regards to her Gallbladder   Has the patient contacted their pharmacy? Yes.   (Agent: If no, request that the patient contact the pharmacy for the refill.) (Agent: If yes, when and what did the pharmacy advise?)  Preferred Pharmacy (with phone number or street name):  [facility], [place] - [address] [facility]  [address] [place] [address]  Phone: [phone] Fax: [phone]     Agent: Please be advised that RX refills may take up to 3 business days. We ask that you follow-up with your pharmacy.

## 2019-01-26 NOTE — Telephone Encounter (Signed)
Requested medication (s) are due for refill today: yes  Requested medication (s) are on the active medication list: yes  Last refill: 12/04/2018  Future visit scheduled:no  Notes to clinic:  Not delegated    Requested Prescriptions  Pending Prescriptions Disp Refills   ondansetron (ZOFRAN-ODT) 4 MG disintegrating tablet [Pharmacy Med Name: ONDANSETRON ODT 4MG  TABLETS] 30 tablet 0    Sig: DISSOLVE 1 TABLET(4 MG) ON THE TONGUE EVERY 8 HOURS AS NEEDED      Not Delegated - Gastroenterology: Antiemetics Failed - 01/26/2019 10:31 AM      Failed - This refill cannot be delegated      Passed - Valid encounter within last 6 months    Recent Outpatient Visits           2 weeks ago [place], [place], [place]   4 weeks ago Acute maxillary sinusitis, recurrence not specified   [facility] [place] American, [place]   1 month ago [place], [place]   3 months ago Menorrhagia with regular cycle   [place], [place]   4 months ago Chronic right-sided low back pain with right-sided sciatica   [place], [place], [place]

## 2019-01-26 NOTE — Telephone Encounter (Signed)
Routing to provider  

## 2019-01-31 ENCOUNTER — Ambulatory Visit: Payer: Self-pay | Admitting: Surgery

## 2019-01-31 DIAGNOSIS — K824 Cholesterolosis of gallbladder: Secondary | ICD-10-CM | POA: Diagnosis not present

## 2019-01-31 NOTE — H&P (View-Only) (Signed)
Subjective:   CC: Gallbladder polyp [K82.4]  HPI:  Cassandra Duarte is a 34 y.o. female who is here for evaluation of above CC. Symptoms were first noted several years ago. Increased episodes of n/v, RUQ pain.  Worsening intensity and frequency. Pain is sharp and intermittent, confined to the right upper quadrant, without radiation.  Associated with n/v, exacerbated by nothing specific.     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  Past Surgical History:  has a past surgical history that includes Laparoscopic tubal ligation.  Family History: family history includes Breast cancer in her paternal aunt; Diabetes in her paternal aunt; Gallbladder disease in her paternal grandmother; Lung cancer in her paternal grandmother.  Social History:  reports that she has quit smoking. She has never used smokeless tobacco. She reports previous alcohol use. No history on file for drug.  Current Medications: has a current medication list which includes the following prescription(s): dextroamphetamine-amphetamine, ergocalciferol (vitamin d2), multivitamin, ondansetron, and prenatal vit-iron fum-folic ac.  Allergies:       Allergies as of 01/31/2019 - Reviewed 01/31/2019  Allergen Reaction Noted  . Ketorolac tromethamine Rash 07/29/2017  . Magnesium trisilicate Anaphylaxis [id number]  . Oseltamivir phosphate Nausea And Vomiting 03/24/2017  . Tramadol Itching 04/09/2015    ROS:  A 15 point review of systems was performed and pertinent positives and negatives noted in HPI    Objective:   BP 108/75   Pulse 93   Ht 160 cm (5\' 3" )   Wt 50.8 kg (112 lb)   LMP  (LMP Unknown)   BMI 19.84 kg/m    Constitutional :  alert, appears stated age, cooperative and no distress  Lymphatics/Throat:  no asymmetry, masses, or scars  Respiratory:   clear to auscultation bilaterally  Cardiovascular:  regular rate and rhythm  Gastrointestinal: soft, no guarding, minor TTP in RUQ.    Musculoskeletal: Steady gait and movement  Skin: Cool and moist  Psychiatric: Normal affect, non-agitated, not confused       LABS:  n/a   RADS: CLINICAL DATA: Right upper quadrant pain  EXAM: ULTRASOUND ABDOMEN LIMITED RIGHT UPPER QUADRANT  COMPARISON: Ultrasound right upper quadrant February 28, 2017; CT abdomen and pelvis March 08, 2018  FINDINGS: Gallbladder:  Within the gallbladder, there is a 6 mm echogenic focus which neither moves nor shadows consistent with a polyp. A 2 mm polyp is seen in the along the posterior wall. There are no echogenic foci which move and shadow as is expected with gallstones. The gallbladder is somewhat contracted without overt gallbladder wall thickening. No pericholecystic fluid evident. No sonographic Murphy sign noted by sonographer.  Common bile duct:  Diameter: 2 mm. No intrahepatic or extrahepatic biliary duct dilatation.  Liver:  There is a mass in the right lobe of the liver measuring 3.6 x 3.2 x 3.8 cm. Note that a mass was present in this area on prior CT. Within normal limits in parenchymal echogenicity. Portal vein is patent on color Doppler imaging with normal direction of blood flow towards the liver.  Other: None.  IMPRESSION: 1. Gallbladder polyps, largest measuring 6 mm in length. Per consensus guidelines, polyps of this size do not warrant additional imaging surveillance.  2. Gallbladder is somewhat contracted. Note that patient has not been postprandial. No gallstones or overt gallbladder wall thickening seen. If there remains concern for gallbladder pathology beyond polyps,  repeat study after minimum of 8 hours fasting may be reasonable.  3. Mass lesion in right lobe of liver, suspected hemangioma. This lesion was also present on previous CT. This lesion does not  have classic hemangioma appearance by ultrasound. Given this circumstance, it may be prudent to consider follow-up ultrasound of the liver in 1 year to assess for stability. No new liver lesions are demonstrable on this study.   Electronically Signed  By: Lowella Grip III M.D.  On: 01/22/2019 13:13 Assessment:      Gallbladder polyp [K82.4]  Plan:   1. Gallbladder polyp [K82.4] Discussed the risk of surgery including post-op infxn, seroma, biloma, chronic pain, poor-delayed wound healing, retained gallstone, conversion to open procedure, post-op SBO or ileus, and need for additional procedures to address said risks.  The risks of general anesthetic including MI, CVA, sudden death or even reaction to anesthetic medications also discussed. Alternatives include continued observation.  Benefits include possible symptom relief, prevention of complications including acute cholecystitis, pancreatitis.  Typical post operative recovery of 3-5 days rest, continued pain in area and incision sites, possible loose stools up to 4-6 weeks, also discussed.  ED return precautions given for sudden increase in RUQ pain, with possible accompanying fever, nausea, and/or vomiting.  The patient understands the risks, any and all questions were answered to the patient's satisfaction.  2. Patient has elected to proceed with surgical treatment., understanding no guarantees pain will resolve.  Procedure will be scheduled.  Written consent was obtained.robotic assisted laparoscopic

## 2019-01-31 NOTE — H&P (Signed)
Subjective:   CC: Gallbladder polyp [K82.4]  HPI:  Cassandra Duarte is a 34 y.o. female who is here for evaluation of above CC. Symptoms were first noted several years ago. Increased episodes of n/v, RUQ pain.  Worsening intensity and frequency. Pain is sharp and intermittent, confined to the right upper quadrant, without radiation.  Associated with n/v, exacerbated by nothing specific.     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  Past Surgical History:  has a past surgical history that includes Laparoscopic tubal ligation.  Family History: family history includes Breast cancer in her paternal aunt; Diabetes in her paternal aunt; Gallbladder disease in her paternal grandmother; Lung cancer in her paternal grandmother.  Social History:  reports that she has quit smoking. She has never used smokeless tobacco. She reports previous alcohol use. No history on file for drug.  Current Medications: has a current medication list which includes the following prescription(s): dextroamphetamine-amphetamine, ergocalciferol (vitamin d2), multivitamin, ondansetron, and prenatal vit-iron fum-folic ac.  Allergies:       Allergies as of 01/31/2019 - Reviewed 01/31/2019  Allergen Reaction Noted  . Ketorolac tromethamine Rash 07/29/2017  . Magnesium trisilicate Anaphylaxis [id number]  . Oseltamivir phosphate Nausea And Vomiting 03/24/2017  . Tramadol Itching 04/09/2015    ROS:  A 15 point review of systems was performed and pertinent positives and negatives noted in HPI    Objective:   BP 108/75   Pulse 93   Ht 160 cm (5\' 3" )   Wt 50.8 kg (112 lb)   LMP  (LMP Unknown)   BMI 19.84 kg/m    Constitutional :  alert, appears stated age, cooperative and no distress  Lymphatics/Throat:  no asymmetry, masses, or scars  Respiratory:   clear to auscultation bilaterally  Cardiovascular:  regular rate and rhythm  Gastrointestinal: soft, no guarding, minor TTP in RUQ.    Musculoskeletal: Steady gait and movement  Skin: Cool and moist  Psychiatric: Normal affect, non-agitated, not confused       LABS:  n/a   RADS: CLINICAL DATA: Right upper quadrant pain  EXAM: ULTRASOUND ABDOMEN LIMITED RIGHT UPPER QUADRANT  COMPARISON: Ultrasound right upper quadrant February 28, 2017; CT abdomen and pelvis March 08, 2018  FINDINGS: Gallbladder:  Within the gallbladder, there is a 6 mm echogenic focus which neither moves nor shadows consistent with a polyp. A 2 mm polyp is seen in the along the posterior wall. There are no echogenic foci which move and shadow as is expected with gallstones. The gallbladder is somewhat contracted without overt gallbladder wall thickening. No pericholecystic fluid evident. No sonographic Murphy sign noted by sonographer.  Common bile duct:  Diameter: 2 mm. No intrahepatic or extrahepatic biliary duct dilatation.  Liver:  There is a mass in the right lobe of the liver measuring 3.6 x 3.2 x 3.8 cm. Note that a mass was present in this area on prior CT. Within normal limits in parenchymal echogenicity. Portal vein is patent on color Doppler imaging with normal direction of blood flow towards the liver.  Other: None.  IMPRESSION: 1. Gallbladder polyps, largest measuring 6 mm in length. Per consensus guidelines, polyps of this size do not warrant additional imaging surveillance.  2. Gallbladder is somewhat contracted. Note that patient has not been postprandial. No gallstones or overt gallbladder wall thickening seen. If there remains concern for gallbladder pathology beyond polyps,  repeat study after minimum of 8 hours fasting may be reasonable.  3. Mass lesion in right lobe of liver, suspected hemangioma. This lesion was also present on previous CT. This lesion does not  have classic hemangioma appearance by ultrasound. Given this circumstance, it may be prudent to consider follow-up ultrasound of the liver in 1 year to assess for stability. No new liver lesions are demonstrable on this study.   Electronically Signed  By: Lowella Grip III M.D.  On: 01/22/2019 13:13 Assessment:      Gallbladder polyp [K82.4]  Plan:   1. Gallbladder polyp [K82.4] Discussed the risk of surgery including post-op infxn, seroma, biloma, chronic pain, poor-delayed wound healing, retained gallstone, conversion to open procedure, post-op SBO or ileus, and need for additional procedures to address said risks.  The risks of general anesthetic including MI, CVA, sudden death or even reaction to anesthetic medications also discussed. Alternatives include continued observation.  Benefits include possible symptom relief, prevention of complications including acute cholecystitis, pancreatitis.  Typical post operative recovery of 3-5 days rest, continued pain in area and incision sites, possible loose stools up to 4-6 weeks, also discussed.  ED return precautions given for sudden increase in RUQ pain, with possible accompanying fever, nausea, and/or vomiting.  The patient understands the risks, any and all questions were answered to the patient's satisfaction.  2. Patient has elected to proceed with surgical treatment., understanding no guarantees pain will resolve.  Procedure will be scheduled.  Written consent was obtained.robotic assisted laparoscopic

## 2019-02-02 ENCOUNTER — Telehealth: Payer: Self-pay | Admitting: Family Medicine

## 2019-02-02 MED ORDER — ONDANSETRON 4 MG PO TBDP: 4.0000 mg | ORAL_TABLET | Freq: Three times a day (TID) | ORAL | 0 refills | Status: DC | PRN

## 2019-02-02 NOTE — Telephone Encounter (Signed)
Patient notified and verbalized understanding. 

## 2019-02-02 NOTE — Telephone Encounter (Signed)
Routing to provider  

## 2019-02-02 NOTE — Telephone Encounter (Signed)
Copied from [place] [id number]. Topic: General - Other >> Feb 02, 2019 10:53 AM Keene Breath wrote: Reason for CRM: Patient would like doctor to call in Zofran because she is having surgery on 01/22 and is nauseous.  Please call patient to approve at CB# [phone]

## 2019-02-02 NOTE — Telephone Encounter (Signed)
This is not high priority, but furthermore I just refilled this 01/26/19. I will send in more but she shouldn't even be out. Also, her surgeon would have given her more if needed after her surgery.

## 2019-02-06 ENCOUNTER — Encounter
Admission: RE | Admit: 2019-02-06 | Discharge: 2019-02-06 | Disposition: A | Discharge status: Home / Self Care | Payer: Medicaid Other | Source: Ambulatory Visit | Attending: Surgery | Admitting: Surgery

## 2019-02-06 ENCOUNTER — Other Ambulatory Visit: Payer: Self-pay

## 2019-02-06 DIAGNOSIS — Z01812 Encounter for preprocedural laboratory examination: Secondary | ICD-10-CM | POA: Insufficient documentation

## 2019-02-06 HISTORY — DX: Anxiety disorder, unspecified: F41.9

## 2019-02-06 HISTORY — DX: Other specified diseases of liver: K76.89

## 2019-02-06 HISTORY — DX: Gastro-esophageal reflux disease without esophagitis: K21.9

## 2019-02-06 HISTORY — DX: Fatty (change of) liver, not elsewhere classified: K76.0

## 2019-02-06 HISTORY — DX: Attention-deficit hyperactivity disorder, unspecified type: F90.9

## 2019-02-06 NOTE — Patient Instructions (Signed)
Your procedure is scheduled on: Friday 1/22 Report to Day Surgery.  [facility] To find out your arrival time please call [phone] between 1PM - 3PM on Thurs. 1/21  Remember: Instructions that are not followed completely may result in serious medical risk,  up to and including death, or upon the discretion of your surgeon and anesthesiologist your  surgery may need to be rescheduled.     _X__ 1. Do not eat food after midnight the night before your procedure.                 No gum chewing or hard candies. You may drink clear liquids up to 2 hours                 before you are scheduled to arrive for your surgery- DO not drink clear                 liquids within 2 hours of the start of your surgery.                 Clear Liquids include:  water, apple juice without pulp, clear carbohydrate                 drink such as Clearfast of Gatorade, Black Coffee or Tea (Do not add                 anything to coffee or tea).  __X__2.  On the morning of surgery brush your teeth with toothpaste and water, you                may rinse your mouth with mouthwash if you wish.  Do not swallow any toothpaste of mouthwash.     ___ 3.  No Alcohol for 24 hours before or after surgery.   ___ 4.  Do Not Smoke or use e-cigarettes For 24 Hours Prior to Your Surgery.                 Do not use any chewable tobacco products for at least 6 hours prior to                 surgery.  ____  5.  Bring all medications with you on the day of surgery if instructed.   __x__  6.  Notify your doctor if there is any change in your medical condition      (cold, fever, infections).     Do not wear jewelry, make-up, hairpins, clips or nail polish. Do not wear lotions, powders, or perfumes. You may wear deodorant. Do not shave 48 hours prior to surgery. Men may shave face and neck. Do not bring valuables to the hospital.    [facility] is not responsible for any belongings or  valuables.  Contacts, dentures or bridgework may not be worn into surgery. Leave your suitcase in the car. After surgery it may be brought to your room. For patients admitted to the hospital, discharge time is determined by your treatment team.   Patients discharged the day of surgery will not be allowed to drive home.   Please read over the following fact sheets that you were given:    _x___ Take these medicines the morning of surgery with A SIP OF WATER:    1. ondansetron (ZOFRAN ODT) 4 MG disintegrating tablet if needed  2.   3.   4.  5.  6.  ____ Fleet Enema (as directed)   __x__ Use CHG  Soap as directed  ____ Use inhalers on the day of surgery  ____ Stop metformin 2 days prior to surgery    ____ Take 1/2 of usual insulin dose the night before surgery. No insulin the morning          of surgery.   ____ Stop Coumadin/Plavix/aspirin on   __x__ Stop Anti-inflammatories  No ibuprofen aleve or aspirin until after surgery   ____ Stop supplements until after surgery.    ____ Bring C-Pap to the hospital.

## 2019-02-07 ENCOUNTER — Other Ambulatory Visit
Admission: RE | Admit: 2019-02-07 | Discharge: 2019-02-07 | Disposition: A | Discharge status: Home / Self Care | Payer: Medicaid Other | Source: Ambulatory Visit | Attending: Surgery | Admitting: Surgery

## 2019-02-07 DIAGNOSIS — Z01812 Encounter for preprocedural laboratory examination: Secondary | ICD-10-CM | POA: Insufficient documentation

## 2019-02-07 DIAGNOSIS — Z20822 Contact with and (suspected) exposure to covid-19: Secondary | ICD-10-CM | POA: Insufficient documentation

## 2019-02-07 LAB — SARS CORONAVIRUS 2 (TAT 6-24 HRS): SARS Coronavirus 2: NEGATIVE

## 2019-02-08 MED ORDER — INDOCYANINE GREEN 25 MG IV SOLR
1.2500 mg | Freq: Once | INTRAVENOUS | Status: AC
  Administered 2019-02-09: 1.25 mg via INTRAVENOUS
  Filled 2019-02-08: qty 25

## 2019-02-09 ENCOUNTER — Ambulatory Visit
Admission: RE | Admit: 2019-02-09 | Discharge: 2019-02-09 | Disposition: A | Discharge status: Home / Self Care | Payer: Medicaid Other | Attending: Surgery | Admitting: Surgery

## 2019-02-09 ENCOUNTER — Encounter: Payer: Self-pay | Admitting: Surgery

## 2019-02-09 ENCOUNTER — Ambulatory Visit: Payer: Medicaid Other | Admitting: Certified Registered"

## 2019-02-09 ENCOUNTER — Other Ambulatory Visit: Payer: Self-pay

## 2019-02-09 ENCOUNTER — Encounter
Admission: RE | Disposition: A | Discharge status: Home / Self Care | Payer: Self-pay | Source: Home / Self Care | Attending: Surgery

## 2019-02-09 DIAGNOSIS — M5441 Lumbago with sciatica, right side: Secondary | ICD-10-CM | POA: Insufficient documentation

## 2019-02-09 DIAGNOSIS — K824 Cholesterolosis of gallbladder: Secondary | ICD-10-CM

## 2019-02-09 DIAGNOSIS — Z888 Allergy status to other drugs, medicaments and biological substances status: Secondary | ICD-10-CM | POA: Diagnosis not present

## 2019-02-09 DIAGNOSIS — Z79899 Other long term (current) drug therapy: Secondary | ICD-10-CM | POA: Diagnosis not present

## 2019-02-09 DIAGNOSIS — F988 Other specified behavioral and emotional disorders with onset usually occurring in childhood and adolescence: Secondary | ICD-10-CM | POA: Insufficient documentation

## 2019-02-09 DIAGNOSIS — Z885 Allergy status to narcotic agent status: Secondary | ICD-10-CM | POA: Insufficient documentation

## 2019-02-09 DIAGNOSIS — G8929 Other chronic pain: Secondary | ICD-10-CM | POA: Diagnosis not present

## 2019-02-09 DIAGNOSIS — K219 Gastro-esophageal reflux disease without esophagitis: Secondary | ICD-10-CM | POA: Diagnosis not present

## 2019-02-09 DIAGNOSIS — K811 Chronic cholecystitis: Secondary | ICD-10-CM | POA: Insufficient documentation

## 2019-02-09 DIAGNOSIS — D135 Benign neoplasm of extrahepatic bile ducts: Secondary | ICD-10-CM | POA: Diagnosis not present

## 2019-02-09 LAB — POCT PREGNANCY, URINE: Preg Test, Ur: NEGATIVE

## 2019-02-09 SURGERY — CHOLECYSTECTOMY, ROBOT-ASSISTED, LAPAROSCOPIC
Anesthesia: General | Site: Abdomen

## 2019-02-09 MED ORDER — FENTANYL CITRATE (PF) 100 MCG/2ML IJ SOLN
INTRAMUSCULAR | Status: AC
  Filled 2019-02-09: qty 2

## 2019-02-09 MED ORDER — BUPIVACAINE HCL (PF) 0.5 % IJ SOLN
INTRAMUSCULAR | Status: AC
  Filled 2019-02-09: qty 30

## 2019-02-09 MED ORDER — ONDANSETRON HCL 4 MG/2ML IJ SOLN
INTRAMUSCULAR | Status: AC
  Administered 2019-02-09: 4 mg via INTRAVENOUS
  Filled 2019-02-09: qty 2

## 2019-02-09 MED ORDER — CHLORHEXIDINE GLUCONATE CLOTH 2 % EX PADS
6.0000 | MEDICATED_PAD | Freq: Once | CUTANEOUS | Status: AC
  Administered 2019-02-09: 6 via TOPICAL

## 2019-02-09 MED ORDER — ROCURONIUM BROMIDE 50 MG/5ML IV SOLN
INTRAVENOUS | Status: AC
  Filled 2019-02-09: qty 1

## 2019-02-09 MED ORDER — SUGAMMADEX SODIUM 200 MG/2ML IV SOLN
INTRAVENOUS | Status: AC
  Filled 2019-02-09: qty 2

## 2019-02-09 MED ORDER — PROPOFOL 10 MG/ML IV BOLUS
INTRAVENOUS | Status: DC | PRN
  Administered 2019-02-09: 100 mg via INTRAVENOUS

## 2019-02-09 MED ORDER — ROCURONIUM BROMIDE 100 MG/10ML IV SOLN
INTRAVENOUS | Status: DC | PRN
  Administered 2019-02-09: 10 mg via INTRAVENOUS
  Administered 2019-02-09: 20 mg via INTRAVENOUS
  Administered 2019-02-09: 5 mg via INTRAVENOUS
  Administered 2019-02-09: 25 mg via INTRAVENOUS
  Administered 2019-02-09: 10 mg via INTRAVENOUS

## 2019-02-09 MED ORDER — SUCCINYLCHOLINE CHLORIDE 20 MG/ML IJ SOLN
INTRAMUSCULAR | Status: DC | PRN
  Administered 2019-02-09: 100 mg via INTRAVENOUS

## 2019-02-09 MED ORDER — LACTATED RINGERS IV SOLN: INTRAVENOUS | Status: DC

## 2019-02-09 MED ORDER — FENTANYL CITRATE (PF) 100 MCG/2ML IJ SOLN
INTRAMUSCULAR | Status: AC
  Administered 2019-02-09: 25 ug via INTRAVENOUS
  Filled 2019-02-09: qty 2

## 2019-02-09 MED ORDER — PHENYLEPHRINE HCL (PRESSORS) 10 MG/ML IV SOLN
INTRAVENOUS | Status: AC
  Filled 2019-02-09: qty 1

## 2019-02-09 MED ORDER — SCOPOLAMINE 1 MG/3DAYS TD PT72
1.0000 | MEDICATED_PATCH | TRANSDERMAL | Status: DC
  Administered 2019-02-09: 1.5 mg via TRANSDERMAL

## 2019-02-09 MED ORDER — DOCUSATE SODIUM 100 MG PO CAPS: 100.0000 mg | ORAL_CAPSULE | Freq: Two times a day (BID) | ORAL | 0 refills | Status: AC | PRN

## 2019-02-09 MED ORDER — PROPOFOL 500 MG/50ML IV EMUL
INTRAVENOUS | Status: DC | PRN
  Administered 2019-02-09: 25 ug/kg/min via INTRAVENOUS

## 2019-02-09 MED ORDER — CEFAZOLIN SODIUM-DEXTROSE 2-4 GM/100ML-% IV SOLN
2.0000 g | INTRAVENOUS | Status: AC
  Administered 2019-02-09: 2 g via INTRAVENOUS

## 2019-02-09 MED ORDER — FAMOTIDINE 20 MG PO TABS: 20.0000 mg | ORAL_TABLET | Freq: Once | ORAL | Status: AC

## 2019-02-09 MED ORDER — FENTANYL CITRATE (PF) 100 MCG/2ML IJ SOLN
25.0000 ug | INTRAMUSCULAR | Status: AC | PRN
  Administered 2019-02-09 (×4): 25 ug via INTRAVENOUS
  Administered 2019-02-09: 50 ug via INTRAVENOUS

## 2019-02-09 MED ORDER — ESMOLOL HCL 100 MG/10ML IV SOLN
INTRAVENOUS | Status: AC
  Filled 2019-02-09: qty 10

## 2019-02-09 MED ORDER — FAMOTIDINE 20 MG PO TABS
ORAL_TABLET | ORAL | Status: AC
  Administered 2019-02-09: 20 mg via ORAL
  Filled 2019-02-09: qty 1

## 2019-02-09 MED ORDER — FENTANYL CITRATE (PF) 100 MCG/2ML IJ SOLN
INTRAMUSCULAR | Status: DC | PRN
  Administered 2019-02-09: 50 ug via INTRAVENOUS
  Administered 2019-02-09 (×2): 25 ug via INTRAVENOUS
  Administered 2019-02-09: 50 ug via INTRAVENOUS
  Administered 2019-02-09 (×2): 25 ug via INTRAVENOUS
  Administered 2019-02-09: 50 ug via INTRAVENOUS

## 2019-02-09 MED ORDER — DEXAMETHASONE SODIUM PHOSPHATE 10 MG/ML IJ SOLN
INTRAMUSCULAR | Status: DC | PRN
  Administered 2019-02-09: 10 mg via INTRAVENOUS

## 2019-02-09 MED ORDER — ONDANSETRON HCL 4 MG/2ML IJ SOLN: 4.0000 mg | Freq: Once | INTRAMUSCULAR | Status: AC

## 2019-02-09 MED ORDER — SUGAMMADEX SODIUM 200 MG/2ML IV SOLN
INTRAVENOUS | Status: DC | PRN
  Administered 2019-02-09: 200 mg via INTRAVENOUS

## 2019-02-09 MED ORDER — SCOPOLAMINE 1 MG/3DAYS TD PT72
MEDICATED_PATCH | TRANSDERMAL | Status: AC
  Filled 2019-02-09: qty 1

## 2019-02-09 MED ORDER — MIDAZOLAM HCL 2 MG/2ML IJ SOLN
INTRAMUSCULAR | Status: AC
  Filled 2019-02-09: qty 2

## 2019-02-09 MED ORDER — ONDANSETRON HCL 4 MG/2ML IJ SOLN
INTRAMUSCULAR | Status: DC | PRN
  Administered 2019-02-09: 4 mg via INTRAVENOUS

## 2019-02-09 MED ORDER — MIDAZOLAM HCL 2 MG/2ML IJ SOLN
INTRAMUSCULAR | Status: DC | PRN
  Administered 2019-02-09: 2 mg via INTRAVENOUS

## 2019-02-09 MED ORDER — ONDANSETRON HCL 4 MG/2ML IJ SOLN
INTRAMUSCULAR | Status: AC
  Filled 2019-02-09: qty 2

## 2019-02-09 MED ORDER — OXYCODONE HCL 5 MG PO TABS
ORAL_TABLET | ORAL | Status: AC
  Administered 2019-02-09: 19:00:00 5 mg
  Filled 2019-02-09: qty 1

## 2019-02-09 MED ORDER — EPHEDRINE SULFATE 50 MG/ML IJ SOLN
INTRAMUSCULAR | Status: AC
  Filled 2019-02-09: qty 1

## 2019-02-09 MED ORDER — EPINEPHRINE PF 1 MG/ML IJ SOLN
INTRAMUSCULAR | Status: AC
  Filled 2019-02-09: qty 1

## 2019-02-09 MED ORDER — SUCCINYLCHOLINE CHLORIDE 20 MG/ML IJ SOLN
INTRAMUSCULAR | Status: AC
  Filled 2019-02-09: qty 1

## 2019-02-09 MED ORDER — CEFAZOLIN SODIUM-DEXTROSE 2-4 GM/100ML-% IV SOLN
INTRAVENOUS | Status: AC
  Filled 2019-02-09: qty 100

## 2019-02-09 MED ORDER — LIDOCAINE HCL (CARDIAC) PF 100 MG/5ML IV SOSY
PREFILLED_SYRINGE | INTRAVENOUS | Status: DC | PRN
  Administered 2019-02-09: 100 mg via INTRAVENOUS

## 2019-02-09 MED ORDER — ESMOLOL HCL 100 MG/10ML IV SOLN
INTRAVENOUS | Status: DC | PRN
  Administered 2019-02-09: 10 ug via INTRAVENOUS

## 2019-02-09 MED ORDER — FENTANYL CITRATE (PF) 100 MCG/2ML IJ SOLN
25.0000 ug | Freq: Once | INTRAMUSCULAR | Status: AC
  Administered 2019-02-09: 25 ug via INTRAVENOUS

## 2019-02-09 MED ORDER — LIDOCAINE HCL (PF) 1 % IJ SOLN
INTRAMUSCULAR | Status: AC
  Filled 2019-02-09: qty 30

## 2019-02-09 MED ORDER — LIDOCAINE HCL (PF) 2 % IJ SOLN
INTRAMUSCULAR | Status: AC
  Filled 2019-02-09: qty 10

## 2019-02-09 MED ORDER — OXYCODONE HCL 5 MG PO TABS: 5.0000 mg | ORAL_TABLET | Freq: Four times a day (QID) | ORAL | 0 refills | Status: DC | PRN

## 2019-02-09 MED ORDER — PROPOFOL 10 MG/ML IV BOLUS
INTRAVENOUS | Status: AC
  Filled 2019-02-09: qty 20

## 2019-02-09 MED ORDER — IBUPROFEN 800 MG PO TABS: 800.0000 mg | ORAL_TABLET | Freq: Three times a day (TID) | ORAL | 0 refills | Status: DC | PRN

## 2019-02-09 MED ORDER — PROMETHAZINE HCL 25 MG/ML IJ SOLN: 6.2500 mg | INTRAMUSCULAR | Status: DC | PRN

## 2019-02-09 MED ORDER — DEXAMETHASONE SODIUM PHOSPHATE 10 MG/ML IJ SOLN
INTRAMUSCULAR | Status: AC
  Filled 2019-02-09: qty 1

## 2019-02-09 MED ORDER — LIDOCAINE-EPINEPHRINE (PF) 1 %-1:200000 IJ SOLN
INTRAMUSCULAR | Status: DC | PRN
  Administered 2019-02-09: 10 mL via SUBCUTANEOUS

## 2019-02-09 SURGICAL SUPPLY — 57 items
ANCHOR TIS RET SYS 235ML (MISCELLANEOUS) ×4 IMPLANT
BAG INFUSER PRESSURE 100CC (MISCELLANEOUS) IMPLANT
BLADE SURG SZ11 CARB STEEL (BLADE) ×4 IMPLANT
CANISTER SUCT 1200ML W/VALVE (MISCELLANEOUS) ×4 IMPLANT
CANNULA REDUC XI 12-8 STAPL (CANNULA) ×1
CANNULA REDUC XI 12-8MM STAPL (CANNULA) ×1
CANNULA REDUCER 12-8 DVNC XI (CANNULA) ×2 IMPLANT
CHLORAPREP W/TINT 26 (MISCELLANEOUS) ×4 IMPLANT
CLIP VESOLOCK MED LG 6/CT (CLIP) ×8 IMPLANT
COVER TIP SHEARS 8 DVNC (MISCELLANEOUS) ×2 IMPLANT
COVER TIP SHEARS 8MM DA VINCI (MISCELLANEOUS) ×2
COVER WAND RF STERILE (DRAPES) IMPLANT
DECANTER SPIKE VIAL GLASS SM (MISCELLANEOUS) ×8 IMPLANT
DEFOGGER SCOPE WARMER CLEARIFY (MISCELLANEOUS) ×4 IMPLANT
DERMABOND ADVANCED (GAUZE/BANDAGES/DRESSINGS) ×2
DERMABOND ADVANCED .7 DNX12 (GAUZE/BANDAGES/DRESSINGS) ×2 IMPLANT
DRAPE 3/4 80X56 (DRAPES) ×4 IMPLANT
DRAPE ARM DVNC X/XI (DISPOSABLE) ×8 IMPLANT
DRAPE COLUMN DVNC XI (DISPOSABLE) ×2 IMPLANT
DRAPE DA VINCI XI ARM (DISPOSABLE) ×8
DRAPE DA VINCI XI COLUMN (DISPOSABLE) ×2
ELECT CAUTERY BLADE 6.4 (BLADE) ×4 IMPLANT
ELECT REM PT RETURN 9FT ADLT (ELECTROSURGICAL) ×4
ELECTRODE REM PT RTRN 9FT ADLT (ELECTROSURGICAL) ×2 IMPLANT
GLOVE BIOGEL PI IND STRL 7.0 (GLOVE) ×4 IMPLANT
GLOVE BIOGEL PI INDICATOR 7.0 (GLOVE) ×4
GLOVE SURG SYN 6.5 ES PF (GLOVE) ×8 IMPLANT
GOWN STRL REUS W/ TWL LRG LVL3 (GOWN DISPOSABLE) ×6 IMPLANT
GOWN STRL REUS W/TWL LRG LVL3 (GOWN DISPOSABLE) ×6
GRASPER SUT TROCAR 14GX15 (MISCELLANEOUS) ×4 IMPLANT
IRRIGATOR SUCT 8 DISP DVNC XI (IRRIGATION / IRRIGATOR) IMPLANT
IRRIGATOR SUCTION 8MM XI DISP (IRRIGATION / IRRIGATOR)
IV NS 1000ML (IV SOLUTION)
IV NS 1000ML BAXH (IV SOLUTION) IMPLANT
LABEL OR SOLS (LABEL) ×4 IMPLANT
NEEDLE HYPO 22GX1.5 SAFETY (NEEDLE) ×4 IMPLANT
NEEDLE INSUFFLATION 14GA 120MM (NEEDLE) ×4 IMPLANT
NS IRRIG 500ML POUR BTL (IV SOLUTION) ×4 IMPLANT
OBTURATOR OPTICAL STANDARD 8MM (TROCAR) ×2
OBTURATOR OPTICAL STND 8 DVNC (TROCAR) ×2
OBTURATOR OPTICALSTD 8 DVNC (TROCAR) ×2 IMPLANT
PACK LAP CHOLECYSTECTOMY (MISCELLANEOUS) ×4 IMPLANT
PENCIL ELECTRO HAND CTR (MISCELLANEOUS) ×4 IMPLANT
SEAL CANN UNIV 5-8 DVNC XI (MISCELLANEOUS) ×6 IMPLANT
SEAL XI 5MM-8MM UNIVERSAL (MISCELLANEOUS) ×6
SOLUTION ELECTROLUBE (MISCELLANEOUS) ×4 IMPLANT
STAPLER CANNULA SEAL DVNC XI (STAPLE) ×2 IMPLANT
STAPLER CANNULA SEAL XI (STAPLE) ×2
SUT MNCRL 4-0 (SUTURE) ×6
SUT MNCRL 4-0 27XMFL (SUTURE) ×6
SUT VIC AB 3-0 SH 27 (SUTURE) ×2
SUT VIC AB 3-0 SH 27X BRD (SUTURE) ×2 IMPLANT
SUT VICRYL 0 AB UR-6 (SUTURE) ×4 IMPLANT
SUTURE MNCRL 4-0 27XMF (SUTURE) ×6 IMPLANT
SYR 20ML LL LF (SYRINGE) ×4 IMPLANT
TROCAR XCEL NON-BLD 5MMX100MML (ENDOMECHANICALS) ×4 IMPLANT
TUBING EVAC SMOKE HEATED PNEUM (TUBING) ×4 IMPLANT

## 2019-02-09 NOTE — Anesthesia Preprocedure Evaluation (Addendum)
Anesthesia Evaluation  Patient identified by MRN, date of birth, ID band Patient awake    Reviewed: Allergy & Precautions, NPO status , Patient's Chart, lab work & pertinent test results  History of Anesthesia Complications (+) Family history of anesthesia reactionNegative for: history of anesthetic complications  Airway Mallampati: II       Dental   Pulmonary neg sleep apnea, neg COPD, Not current smoker, former smoker,           Cardiovascular (-) hypertension(-) Past MI and (-) CHF (-) dysrhythmias (-) Valvular Problems/Murmurs     Neuro/Psych neg Seizures Anxiety    GI/Hepatic Neg liver ROS, GERD  Medicated and Controlled,  Endo/Other  neg diabetes  Renal/GU negative Renal ROS     Musculoskeletal   Abdominal   Peds  Hematology   Anesthesia Other Findings   Reproductive/Obstetrics                            Anesthesia Physical Anesthesia Plan  ASA: II  Anesthesia Plan: General   Post-op Pain Management:    Induction: Intravenous  PONV Risk Score and Plan: 3 and Ondansetron, Dexamethasone and Midazolam  Airway Management Planned: Oral ETT  Additional Equipment:   Intra-op Plan:   Post-operative Plan:   Informed Consent: I have reviewed the patients History and Physical, chart, labs and discussed the procedure including the risks, benefits and alternatives for the proposed anesthesia with the patient or authorized representative who has indicated his/her understanding and acceptance.       Plan Discussed with:   Anesthesia Plan Comments:         Anesthesia Quick Evaluation

## 2019-02-09 NOTE — Op Note (Signed)
Preoperative diagnosis:  gallbladder polyp  Postoperative diagnosis: same as above  Procedure: Robotic assisted Laparoscopic Cholecystectomy.   Anesthesia: GETA   Surgeon: Benjamine Sprague  Specimen: Gallbladder  Complications: None  EBL: 34mL  Wound Classification: Clean Contaminated  Indications: see HPI  Findings: Critical view of safety noted Cystic duct and artery identified, ligated and divided, clips remained intact at end of procedure Adequate hemostasis  Description of procedure:  The patient was placed on the operating table in the supine position. SCDs placed, pre-op abx administered.  General anesthesia was induced and OG tube placed by anesthesia. A time-out was completed verifying correct patient, procedure, site, positioning, and implant(s) and/or special equipment prior to beginning this procedure. The abdomen was prepped and draped in the usual sterile fashion.    Veress needle was placed at the Palmer's point and insufflation was started after confirming a positive saline drop test and no immediate increase in abdominal pressure.  After reaching 15 mm, the Veress needle was removed and a 5 mm port was placed via optiview technique at same point. The abdomen was inspected and no abnormalities or injuries were found.  Under direct vision, ports were placed in the following locations: One 12 mm patient left of the umbilicus, 8cm from a 40mm infraumbilical portt, one 8 mm port placed to the patient right of the umbilical port 8 cm apart.  1 additional 8 mm port placed lateral to the 19mm port.  Once ports were placed, The table was placed in the reverse Trendelenburg position with the right side up. The Xi platform was brought into the operative field and docked to the ports successfully.  An endoscope was placed through the umbilical port, fenestrated grasper through the adjacent patient right port, prograsp to the far patient left port, and then a hook cautery in the left  port.  The dome of the gallbladder was grasped with prograsp, passed and retracted over the dome of the liver. Adhesions between the gallbladder and omentum, duodenum and transverse colon were lysed via hook cautery. The infundibulum was grasped with the fenestrated grasper and retracted toward the right lower quadrant. This maneuver exposed Calot's triangle. The peritoneum overlying the gallbladder infundibulum was then dissected using combination of [place] dissector and electrocautery hook and the cystic duct and very short cystic artery identified.  Critical view of safety with the liver bed clearly visible behind the duct and artery with no additional structures noted.  The cystic duct and cystic artery clipped and divided close to the gallbladder.  1 robotic clip placed at the base of the cystic duct, one clip for the cystic artery.   The gallbladder was then dissected from its peritoneal and liver bed attachments by electrocautery. Hemostasis was checked prior to removing the pro-grasp retractor and placing the endoscope through the port it was in.  The Endo Catch bag was then placed through the 12 mm port and the gallbladder was removed.  The gallbladder was passed off the table as a specimen. There was no evidence of bleeding from the gallbladder fossa or cystic artery or leakage of the bile from the cystic duct stump. The 12 mm port site closed with PMI using 0 vicryl under direct vision.  Abdomen desufflated and secondary trocars were removed under direct vision. No bleeding was noted. All skin incisions then closed with subcuticular sutures of 4-0 monocryl and dressed with topical skin adhesive. The orogastric tube was removed and patient extubated.  The patient tolerated the procedure well and was  taken to the postanesthesia care unit in stable condition.  All sponge and instrument count correct at end of procedure.

## 2019-02-09 NOTE — Discharge Instructions (Signed)
Laparoscopic Cholecystectomy, Care After This sheet gives you information about how to care for yourself after your procedure. Your doctor may also give you more specific instructions. If you have problems or questions, contact your doctor. Follow these instructions at home: Care for cuts from surgery (incisions)   Follow instructions from your doctor about how to take care of your cuts from surgery. Make sure you: ? Wash your hands with soap and water before you change your bandage (dressing). If you cannot use soap and water, use hand sanitizer. ? Change your bandage as told by your doctor. ? Leave stitches (sutures), skin glue, or skin tape (adhesive) strips in place. They may need to stay in place for 2 weeks or longer. If tape strips get loose and curl up, you may trim the loose edges. Do not remove tape strips completely unless your doctor says it is okay.  Do not take baths, swim, or use a hot tub until your doctor says it is okay. OK TO SHOWER 24HRS AFTER YOUR SURGERY.   Check your surgical cut area every day for signs of infection. Check for: ? More redness, swelling, or pain. ? More fluid or blood. ? Warmth. ? Pus or a bad smell. Activity  Do not drive or use heavy machinery while taking prescription pain medicine.  Do not play contact sports until your doctor says it is okay.  Do not drive for 24 hours if you were given a medicine to help you relax (sedative).  Rest as needed. Do not return to work or school until your doctor says it is okay. General instructions .   advil as needed for discomfort.   Use narcotics, if prescribed, only when motrin is not enough to control pain. .   Advil up to 800mg  per dose every 8hrs as needed for pain.    To prevent or treat constipation while you are taking prescription pain medicine, your doctor may recommend that you: ? Drink enough fluid to keep your pee (urine) clear or pale yellow. ? Take over-the-counter or prescription  medicines. ? Eat foods that are high in fiber, such as fresh fruits and vegetables, whole grains, and beans. ? Limit foods that are high in fat and processed sugars, such as fried and sweet foods. Contact a doctor if:  You develop a rash.  You have more redness, swelling, or pain around your surgical cuts.  You have more fluid or blood coming from your surgical cuts.  Your surgical cuts feel warm to the touch.  You have pus or a bad smell coming from your surgical cuts.  You have a fever.  One or more of your surgical cuts breaks open. Get help right away if:  You have trouble breathing.  You have chest pain.  You have pain that is getting worse in your shoulders.  You faint or feel dizzy when you stand.  You have very bad pain in your belly (abdomen).  You are sick to your stomach (nauseous) for more than one day.  You have throwing up (vomiting) that lasts for more than one day.  You have leg pain. This information is not intended to replace advice given to you by your health care provider. Make sure you discuss any questions you have with your health care provider. Document Released: 10/14/2007 Document Revised: 07/26/2015 Document Reviewed: 06/23/2015 Elsevier Interactive Patient Education  2019 [place]   1) The drugs that you were given will stay in  your system until tomorrow so for the next 24 hours you should not:  A) Drive an automobile B) Make any legal decisions C) Drink any alcoholic beverage   2) You may resume regular meals tomorrow.  Today it is better to start with liquids and gradually work up to solid foods.  You may eat anything you prefer, but it is better to start with liquids, then soup and crackers, and gradually work up to solid foods.   3) Please notify your doctor immediately if you have any unusual bleeding, trouble breathing, redness and pain at the surgery site, drainage, fever, or  pain not relieved by medication.    4) Additional Instructions:        Please contact your physician with any problems or Same Day Surgery at [phone], Monday through Friday 6 am to 4 pm, or [place] at [facility] number at [phone].   Oxycodone 5mg  taken February 08, 2019 at 7:05pm.

## 2019-02-09 NOTE — Transfer of Care (Signed)
Immediate Anesthesia Transfer of Care Note  Patient: Cassandra Duarte  Procedure(s) Performed: XI ROBOTIC ASSISTED LAPAROSCOPIC CHOLECYSTECTOMY (N/A Abdomen) INDOCYANINE GREEN FLUORESCENCE IMAGING (ICG)  Patient Location: PACU  Anesthesia Type:General  Level of Consciousness: drowsy  Airway & Oxygen Therapy: Patient Spontanous Breathing and Patient connected to face mask oxygen  Post-op Assessment: Report given to RN and Post -op Vital signs reviewed and stable  Post vital signs: Reviewed and stable  Last Vitals:  Vitals Value Taken Time  BP 115/75 02/09/19 1727  Temp 36.4 C 02/09/19 1727  Pulse 96 02/09/19 1728  Resp 19 02/09/19 1728  SpO2 100 % 02/09/19 1728  Vitals shown include unvalidated device data.  Last Pain:  Vitals:   02/09/19 1507  TempSrc:   PainSc: 6          Complications: No apparent anesthesia complications

## 2019-02-09 NOTE — Interval H&P Note (Signed)
History and Physical Interval Note:  02/09/2019 3:02 PM  Cassandra Duarte  has presented today for surgery, with the diagnosis of K82.4 Gallbladder polyp.  The various methods of treatment have been discussed with the patient and family. After consideration of risks, benefits and other options for treatment, the patient has consented to  Procedure(s): XI ROBOTIC [place] (N/A) as a surgical intervention.  The patient's history has been reviewed, patient examined, no change in status, stable for surgery.  I have reviewed the patient's chart and labs.  Questions were answered to the patient's satisfaction.     [name]

## 2019-02-09 NOTE — Anesthesia Postprocedure Evaluation (Signed)
Anesthesia Post Note  Patient: Armilla Wedemeier  Procedure(s) Performed: XI ROBOTIC ASSISTED LAPAROSCOPIC CHOLECYSTECTOMY (N/A Abdomen) INDOCYANINE GREEN FLUORESCENCE IMAGING (ICG)  Patient location during evaluation: PACU Anesthesia Type: General Level of consciousness: awake and alert Pain management: pain level controlled Vital Signs Assessment: post-procedure vital signs reviewed and stable Respiratory status: spontaneous breathing, nonlabored ventilation, respiratory function stable and patient connected to nasal cannula oxygen Cardiovascular status: blood pressure returned to baseline and stable Postop Assessment: no apparent nausea or vomiting Anesthetic complications: no     Last Vitals:  Vitals:   02/09/19 1927 02/09/19 1948  BP: 110/78 108/75  Pulse:    Resp: 14 16  Temp:    SpO2:  100%    Last Pain:  Vitals:   02/09/19 1948  TempSrc:   PainSc: 4                  Martha Clan

## 2019-02-09 NOTE — Anesthesia Procedure Notes (Signed)
Procedure Name: Intubation Date/Time: 02/09/2019 3:26 PM Performed by: Nolon Lennert, RN Pre-anesthesia Checklist: Patient identified, Patient being monitored, Timeout performed, Emergency Drugs available and Suction available Patient Re-evaluated:Patient Re-evaluated prior to induction Oxygen Delivery Method: Circle system utilized Preoxygenation: Pre-oxygenation with 100% oxygen Induction Type: IV induction, Rapid sequence and Cricoid Pressure applied Laryngoscope Size: Mac and 3 Grade View: Grade I Tube type: Oral Tube size: 7.0 mm Number of attempts: 1 Airway Equipment and Method: Stylet Placement Confirmation: ETT inserted through vocal cords under direct vision,  positive ETCO2 and breath sounds checked- equal and bilateral Secured at: 22 cm Tube secured with: Tape Dental Injury: Teeth and Oropharynx as per pre-operative assessment

## 2019-02-13 LAB — SURGICAL PATHOLOGY

## 2019-03-13 ENCOUNTER — Other Ambulatory Visit: Payer: Self-pay | Admitting: Family Medicine

## 2019-03-13 NOTE — Telephone Encounter (Signed)
Routing to provider  

## 2019-03-13 NOTE — Telephone Encounter (Signed)
Copied from [place] [id number]. Topic: Quick Communication - Rx Refill/Question >> Mar 13, 2019  3:08 PM Leward Quan A wrote: Medication: amphetamine-dextroamphetamine (ADDERALL) 5 MG tablet  Has the patient contacted their pharmacy? Yes.   (Agent: If no, request that the patient contact the pharmacy for the refill.) (Agent: If yes, when and what did the pharmacy advise?)  Preferred Pharmacy (with phone number or street name): [facility] [id number], [place] [facility]  Phone:  [phone] Fax:  [phone]     Agent: Please be advised that RX refills may take up to 3 business days. We ask that you follow-up with your pharmacy.

## 2019-03-14 MED ORDER — AMPHETAMINE-DEXTROAMPHETAMINE 5 MG PO TABS: 5.0000 mg | ORAL_TABLET | Freq: Every day | ORAL | 0 refills | Status: DC | PRN

## 2019-03-22 ENCOUNTER — Other Ambulatory Visit: Payer: Self-pay | Admitting: Family Medicine

## 2019-03-22 MED ORDER — AMPHETAMINE-DEXTROAMPHETAMINE 20 MG PO TABS: 20.0000 mg | ORAL_TABLET | Freq: Two times a day (BID) | ORAL | 0 refills | Status: DC

## 2019-03-22 NOTE — Telephone Encounter (Signed)
Medication Refill - Medication: amphetamine-dextroamphetamine (ADDERALL) 20 MG tablet   Pt states that she is missing this medication, pt's call disconnected. I did not hang up.  Has the patient contacted their pharmacy? Yes.   (Agent: If no, request that the patient contact the pharmacy for the refill.) (Agent: If yes, when and what did the pharmacy advise?)  Preferred Pharmacy (with phone number or street name):  [facility], [place] [address] [facility]  [address] [place] [address]  Phone: [phone] Fax: [phone]     Agent: Please be advised that RX refills may take up to 3 business days. We ask that you follow-up with your pharmacy.

## 2019-03-22 NOTE — Telephone Encounter (Signed)
LOV: 01/09/2019

## 2019-03-26 ENCOUNTER — Telehealth (INDEPENDENT_AMBULATORY_CARE_PROVIDER_SITE_OTHER): Payer: Medicaid Other | Admitting: Family Medicine

## 2019-03-26 ENCOUNTER — Encounter: Payer: Self-pay | Admitting: Family Medicine

## 2019-03-26 VITALS — Ht 63.0 in | Wt 118.0 lb

## 2019-03-26 DIAGNOSIS — R112 Nausea with vomiting, unspecified: Secondary | ICD-10-CM

## 2019-03-26 DIAGNOSIS — F988 Other specified behavioral and emotional disorders with onset usually occurring in childhood and adolescence: Secondary | ICD-10-CM | POA: Diagnosis not present

## 2019-03-26 DIAGNOSIS — F419 Anxiety disorder, unspecified: Secondary | ICD-10-CM

## 2019-03-26 NOTE — Progress Notes (Signed)
Ht 5\' 3"  (1.6 m)   Wt 118 lb (53.5 kg)   BMI 20.90 kg/m    Subjective:    Patient ID: Cassandra Duarte, female    DOB: 02/08/1985, 34 y.o.   MRN: [id number]  HPI: Cassandra Duarte is a 34 y.o. female  Chief Complaint  Patient presents with  . Anxiety  . ADHD    . This visit was completed via telephone due to the restrictions of the COVID-19 pandemic. All issues as above were discussed and addressed. Physical exam was done as above through visual confirmation on telephone. If it was felt that the patient should be evaluated in the office, they were directed there. The patient verbally consented to this visit. . Location of the patient: home . Location of the provider: work . Those involved with this call:  . Provider: Merrie Roof, PA-C . CMA: Lesle Chris, [place] . Front Desk/Registration: Jill Side  . Time spent on call: 25 minutes on the phone discussing health concerns. 5 minutes total spent in review of patient's record and preparation of their chart. I verified patient identity using two factors (patient name and date of birth). Patient consents verbally to being seen via telemedicine visit today.   Has been feeling so much better since gallbladder surgery last month. Pain better, appetite improved. Not having consistent N/V as she was having before. Does note she still can't eat large amounts at a time.   Anxiety has been exacerbated with some recent stressors in her life. Buspar hadn't helped previously but she wonders if that was due to the severe pain she had been in at that time. Awaiting getting into Psychiatry but states she has been unable to get scheduled as of yet due to phone changes. Plans to call them to schedule ASAP. Does not wish to try anything prior.   ADD - Adderall XR 20 mg working well for daily use, takes 5 mg adderall prn in afternoons. This regimen helps significantly with her focus and organization, particularly with her schoolwork and  appointment schedule.   Depression screen [facility] 2/9 03/26/2019 01/09/2019 06/29/2017  Decreased Interest 1 1 0  Down, Depressed, Hopeless 1 0 0  PHQ - 2 Score 2 1 0  Altered sleeping 0 2 -  Tired, decreased energy 0 - -  Change in appetite 0 - -  Feeling bad or failure about yourself  0 - -  Trouble concentrating 0 - -  Moving slowly or fidgety/restless 0 - -  Suicidal thoughts 0 - -  PHQ-9 Score 2 3 -   GAD 7 : Generalized Anxiety Score 03/26/2019 05/26/2017  Nervous, Anxious, on Edge 1 1  Control/stop worrying 1 2  Worry too much - different things 1 3  Trouble relaxing 1 1  Restless 0 1  Easily annoyed or irritable 1 1  Afraid - awful might happen 0 0  Total GAD 7 Score 5 9  Anxiety Difficulty - Somewhat difficult   Relevant past medical, surgical, family and social history reviewed and updated as indicated. Interim medical history since our last visit reviewed. Allergies and medications reviewed and updated.  Review of Systems  Per HPI unless specifically indicated above     Objective:    Ht 5\' 3"  (1.6 m)   Wt 118 lb (53.5 kg)   BMI 20.90 kg/m   Wt Readings from Last 3 Encounters:  03/26/19 118 lb (53.5 kg)  02/09/19 112 lb (50.8 kg)  02/06/19 112 lb (50.8 kg)  Physical Exam  Unable to perform PE due to technical difficulties with video technology for today's visit     Assessment & Plan:   Problem List Items Addressed This Visit      Other   ADD (attention deficit disorder)    Stable and under good control, continue current regimen      Anxiety - Primary    Offered to trial medication today, pt requesting to wait for Psychiatry consultation. Continue to monitor closely       Other Visit Diagnoses    Nausea and vomiting, intractability of vomiting not specified, unspecified vomiting type       Significantly improved post cholecystectomy. Continue to monitor closely and avoid fatty foods       Follow up plan: Return in about 3 months (around 06/26/2019)  for ADD.

## 2019-03-26 NOTE — Assessment & Plan Note (Signed)
Offered to trial medication today, pt requesting to wait for Psychiatry consultation. Continue to monitor closely

## 2019-03-26 NOTE — Assessment & Plan Note (Signed)
Stable and under good control, continue current regimen 

## 2019-04-05 ENCOUNTER — Ambulatory Visit: Payer: Medicaid Other | Admitting: Physician Assistant

## 2019-04-05 ENCOUNTER — Other Ambulatory Visit: Payer: Self-pay

## 2019-04-05 DIAGNOSIS — B9689 Other specified bacterial agents as the cause of diseases classified elsewhere: Secondary | ICD-10-CM

## 2019-04-05 DIAGNOSIS — Z0389 Encounter for observation for other suspected diseases and conditions ruled out: Secondary | ICD-10-CM | POA: Diagnosis not present

## 2019-04-05 DIAGNOSIS — Z113 Encounter for screening for infections with a predominantly sexual mode of transmission: Secondary | ICD-10-CM | POA: Diagnosis not present

## 2019-04-05 DIAGNOSIS — Z3009 Encounter for other general counseling and advice on contraception: Secondary | ICD-10-CM | POA: Diagnosis not present

## 2019-04-05 DIAGNOSIS — Z1388 Encounter for screening for disorder due to exposure to contaminants: Secondary | ICD-10-CM | POA: Diagnosis not present

## 2019-04-05 DIAGNOSIS — Z9851 Tubal ligation status: Secondary | ICD-10-CM

## 2019-04-05 DIAGNOSIS — N760 Acute vaginitis: Secondary | ICD-10-CM | POA: Diagnosis not present

## 2019-04-05 MED ORDER — METRONIDAZOLE 500 MG PO TABS: 500.0000 mg | ORAL_TABLET | Freq: Two times a day (BID) | ORAL | 0 refills | Status: AC

## 2019-04-06 ENCOUNTER — Encounter: Payer: Self-pay | Admitting: Physician Assistant

## 2019-04-06 DIAGNOSIS — Z9851 Tubal ligation status: Secondary | ICD-10-CM | POA: Insufficient documentation

## 2019-04-06 NOTE — Progress Notes (Signed)
[facility] Department STI clinic/screening visit  Subjective:  Cassandra Duarte is a 34 y.o. female being seen today for an STI screening visit. The patient reports they do have symptoms.  Patient reports that they do not desire a pregnancy in the next year.   They reported they are not interested in discussing contraception today.  No LMP recorded. (Menstrual status: Irregular Periods).   Patient has the following medical conditions:   Patient Active Problem List   Diagnosis Date Noted  . Anxiety 01/15/2019  . Controlled substance agreement signed 11/29/2017  . Dysuria 10/10/2017  . Status post laparoscopy with adhesio lysis 07/13/2017  . Chronic pain syndrome 06/29/2017  . 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 Myofascial pain 06/29/2017  . ADD (attention deficit disorder) 05/26/2017  . Menorrhagia with regular cycle 05/03/2017  . Dysmenorrhea 05/03/2017  . Overweight 10/01/2015  . Lactational amenorrhea 10/01/2015  . Family history of breast cancer 10/01/2015  . Chronic low back pain with right-sided sciatica 10/09/2014    Chief Complaint  Patient presents with  . SEXUALLY TRANSMITTED DISEASE    HPI  Patient reports that she has had a white discharge with an odor for 2 weeks and "sore" for about 1 week.  LMP about 2 weeks ago and had BTL.  See flowsheet for further details and programmatic requirements.    The following portions of the patient's history were reviewed and updated as appropriate: allergies, current medications, past medical history, past social history, past surgical history and problem list.  Objective:  There were no vitals filed for this visit.  Physical Exam Constitutional:      General: She is not in acute distress.    Appearance: Normal appearance. She is normal weight.  HENT:     Head: Normocephalic and atraumatic.     Comments: No nits, lice, or hair loss. No cervical, supraclavicular or axillary adenopathy.  Mouth/Throat:     Mouth: Mucous membranes are moist.     Pharynx: Oropharynx is clear. No oropharyngeal exudate or posterior oropharyngeal erythema.  Eyes:     Conjunctiva/sclera: Conjunctivae normal.  Pulmonary:     Effort: Pulmonary effort is normal.  Abdominal:     Palpations: Abdomen is soft. There is no mass.     Tenderness: There is no abdominal tenderness. There is no guarding or rebound.  Genitourinary:    General: Normal vulva.     Rectum: Normal.     Comments: External genitalia/pubic area without nits, lice, edema, erythema and inguinal adenopathy. Near base of left labia majora 1 ~22mm scabbed/healing area,nt, no exudate, non-ulcerative. Vagina with normal mucosa and small amount of thin, grayish discharge, pH=>4.5. Cervix without visible lesions. Uterus firm, mobile, nt, no masses, no CMT, no adnexal tenderness or fullness. Musculoskeletal:     Cervical back: Neck supple. No tenderness.  Skin:    General: Skin is warm and dry.     Findings: No bruising, lesion or rash.  Neurological:     Mental Status: She is alert and oriented to person, place, and time.  Psychiatric:        Mood and Affect: Mood normal.        Behavior: Behavior normal.        Thought Content: Thought content normal.        Judgment: Judgment normal.      Assessment and Plan:  Cassandra Duarte is a 34 y.o. female presenting to the [facility] for STI screening  1.  Screening for STD (sexually transmitted disease) Patient into clinic with symptoms. Rec condoms with all sex. Await test results.  Counseled that RN will call if needs to RTC for further treatment once results are back.  - WET PREP FOR [place], YEAST, CLUE - Chlamydia/Gonorrhea [place] [facility] - HIV [place] [facility] - Syphilis Serology, [place] [facility]  2. BV (bacterial vaginosis) Will treat for BV with Metronidazole 500mg  #14 1 po BID for 7 days with food, no EtOH for 24 hr before and until 72 hr after completing  medicine. No sex for 7 days. Rec using OTC antifungal cream if has itching during or just after treatment with antibiotic. - metroNIDAZOLE (FLAGYL) 500 MG tablet; Take 1 tablet (500 mg total) by mouth 2 (two) times daily for 7 days.  Dispense: 14 tablet; Refill: 0     No follow-ups on file.  Future Appointments  Date Time Provider Carrier  06/27/2019 11:15 AM Volney American, PA-C [facility] [place], [place]

## 2019-04-08 NOTE — Progress Notes (Signed)
Chart reviewed by Pharmacist  Suzanne Walker PharmD, Contract Pharmacist at [facility]  

## 2019-04-10 LAB — WET PREP FOR TRICH, YEAST, CLUE
Trichomonas Exam: NEGATIVE
Yeast Exam: NEGATIVE

## 2019-04-18 ENCOUNTER — Other Ambulatory Visit: Payer: Self-pay | Admitting: Family Medicine

## 2019-04-18 NOTE — Telephone Encounter (Signed)
Requested medication (s) are due for refill today: Adderall 20 mg, yes  Requested medication (s) are on the active medication list: yes  Last refill:  03/22/19  Future visit scheduled: yes  Notes to clinic:  not delegated  Requested medication (s) are due for refill today: Adderall 5 mg, yes  Requested medication (s) are on the active medication list: yes  Last refill:  03/14/19  Future visit scheduled: yes  Notes to clinic:  not delegated  Requested Prescriptions  Pending Prescriptions Disp Refills   amphetamine-dextroamphetamine (ADDERALL) 20 MG tablet 60 tablet 0    Sig: Take 1 tablet (20 mg total) by mouth 2 (two) times daily.      Not Delegated - Psychiatry:  Stimulants/ADHD Failed - 04/18/2019 12:46 PM      Failed - This refill cannot be delegated      Failed - Urine Drug Screen completed in last 360 days.      Passed - Valid encounter within last 3 months    Recent Outpatient Visits           3 weeks ago [place], [place], [place]   3 months ago [place], [place], [place]   3 months ago Acute maxillary sinusitis, recurrence not specified   [place], [place], [place]   4 months ago [place], [place], [place]   6 months ago Menorrhagia with regular cycle   [facility] Volney American, [place]       Future Appointments             In 2 months Orene Desanctis, Lilia Argue, [place], [facility]              amphetamine-dextroamphetamine (ADDERALL) 5 MG tablet 30 tablet 0    Sig: Take 1 tablet (5 mg total) by mouth daily as needed.      Not Delegated - Psychiatry:  Stimulants/ADHD Failed - 04/18/2019 12:46 PM      Failed - This refill cannot be delegated      Failed - Urine Drug Screen completed in last 360 days.      Passed - Valid encounter within last 3 months    Recent Outpatient Visits            3 weeks ago [place], [place]   3 months ago [place], [place], [place]   3 months ago Acute maxillary sinusitis, recurrence not specified   [place], [place]   4 months ago [place], [place]   6 months ago Menorrhagia with regular cycle   [place], Lilia Argue, [place]       Future Appointments             In 2 months Orene Desanctis, Lilia Argue, [place], [place]

## 2019-04-18 NOTE — Telephone Encounter (Signed)
Patient last seen 03/26/19 and has appointment 06/26/19

## 2019-04-18 NOTE — Telephone Encounter (Signed)
Copied from [place] [id number]. Topic: Quick Communication - Rx Refill/Question >> Apr 18, 2019 12:36 PM Leward Quan A wrote: Medication: amphetamine-dextroamphetamine (ADDERALL) 5 MG tablet, amphetamine-dextroamphetamine (ADDERALL) 20 MG tablet Per patient she is going away on 04/20/19 would like to pick up the 5 MG before leaving please   Has the patient contacted their pharmacy? Yes.   (Agent: If no, request that the patient contact the pharmacy for the refill.) (Agent: If yes, when and what did the pharmacy advise?)  Preferred Pharmacy (with phone number or street name): [facility] [id number], [facility] [place]  Phone:  [phone] Fax:  [phone]     Agent: Please be advised that RX refills may take up to 3 business days. We ask that you follow-up with your pharmacy.

## 2019-04-19 ENCOUNTER — Other Ambulatory Visit: Payer: Self-pay | Admitting: Family Medicine

## 2019-04-19 MED ORDER — AMPHETAMINE-DEXTROAMPHETAMINE 5 MG PO TABS: 5.0000 mg | ORAL_TABLET | Freq: Every day | ORAL | 0 refills | Status: DC | PRN

## 2019-04-19 MED ORDER — AMPHETAMINE-DEXTROAMPHETAMINE 20 MG PO TABS: 20.0000 mg | ORAL_TABLET | Freq: Two times a day (BID) | ORAL | 0 refills | Status: DC

## 2019-04-19 NOTE — Telephone Encounter (Signed)
Called pt advised her that rx has been sent to the pharmacy

## 2019-04-19 NOTE — Telephone Encounter (Signed)
Routing to provider  

## 2019-04-19 NOTE — Telephone Encounter (Signed)
Pt called to report that she is leaving for the beach tomorrow and that she is hoping to receive this refill soon. Advised that PCP has 3 business days.

## 2019-05-14 ENCOUNTER — Other Ambulatory Visit: Payer: Self-pay | Admitting: Family Medicine

## 2019-05-14 NOTE — Telephone Encounter (Signed)
Requested medication (s) are due for refill today: yes  Requested medication (s) are on the active medication list: yes  Last refill:  04/19/19 for both  Future visit scheduled: yes  Notes to clinic:  medication not delegated to NT to refill   Requested Prescriptions  Pending Prescriptions Disp Refills   amphetamine-dextroamphetamine (ADDERALL) 20 MG tablet 60 tablet 0    Sig: Take 1 tablet (20 mg total) by mouth 2 (two) times daily.      Not Delegated - Psychiatry:  Stimulants/ADHD Failed - 05/14/2019  2:03 PM      Failed - This refill cannot be delegated      Failed - Urine Drug Screen completed in last 360 days.      Passed - Valid encounter within last 3 months    Recent Outpatient Visits           1 month ago [place], [place], [place]   4 months ago [place], [place], [place]   4 months ago Acute maxillary sinusitis, recurrence not specified   [place], [place], [place]   5 months ago [place], [place], [place]   7 months ago Menorrhagia with regular cycle   [facility] Volney American, [place]       Future Appointments             In 1 month Orene Desanctis, Lilia Argue, [place], [facility]              amphetamine-dextroamphetamine (ADDERALL) 5 MG tablet 30 tablet 0    Sig: Take 1 tablet (5 mg total) by mouth daily as needed.      Not Delegated - Psychiatry:  Stimulants/ADHD Failed - 05/14/2019  2:03 PM      Failed - This refill cannot be delegated      Failed - Urine Drug Screen completed in last 360 days.      Passed - Valid encounter within last 3 months    Recent Outpatient Visits           1 month ago [place], [place], [place]   4 months ago [place], [place], [place]   4 months ago Acute maxillary  sinusitis, recurrence not specified   [place], [place]   5 months ago [place], [place]   7 months ago Menorrhagia with regular cycle   [place], Lilia Argue, [place]       Future Appointments             In 1 month Orene Desanctis, Lilia Argue, [place], [place]

## 2019-05-14 NOTE — Telephone Encounter (Signed)
Medication Refill - Medication: amphetamine-dextroamphetamine (ADDERALL) 20 MG tablet amphetamine-dextroamphetamine (ADDERALL) 5 MG tablet  Has the patient contacted their pharmacy? No. (Agent: If no, request that the patient contact the pharmacy for the refill.) (Agent: If yes, when and what did the pharmacy advise?)  Preferred Pharmacy (with phone number or street name): *[place], [place] - [address] AT [facility]  [place], [place] [address]  Phone:  [phone] Fax:  [phone]  Agent: Please be advised that RX refills may take up to 3 business days. We ask that you follow-up with your pharmacy.

## 2019-05-15 MED ORDER — AMPHETAMINE-DEXTROAMPHETAMINE 20 MG PO TABS: 20.0000 mg | ORAL_TABLET | Freq: Two times a day (BID) | ORAL | 0 refills | Status: DC

## 2019-05-15 MED ORDER — AMPHETAMINE-DEXTROAMPHETAMINE 5 MG PO TABS: 5.0000 mg | ORAL_TABLET | Freq: Every day | ORAL | 0 refills | Status: DC | PRN

## 2019-06-19 ENCOUNTER — Other Ambulatory Visit: Payer: Self-pay | Admitting: Family Medicine

## 2019-06-19 NOTE — Telephone Encounter (Signed)
Last fill was 05/19/19.

## 2019-06-19 NOTE — Telephone Encounter (Signed)
Medication Refill - Medication: amphetamine-dextroamphetamine (ADDERALL) 20 MG tablet   Has the patient contacted their pharmacy? No. (Agent: If no, request that the patient contact the pharmacy for the refill.) (Agent: If yes, when and what did the pharmacy advise?)  Preferred Pharmacy (with phone number or street name): [place], [place] - [address] AT [facility]  [place], [place] [address]  Phone:  [phone] Fax:  [phone]   Agent: Please be advised that RX refills may take up to 3 business days. We ask that you follow-up with your pharmacy.

## 2019-06-20 ENCOUNTER — Other Ambulatory Visit: Payer: Self-pay | Admitting: Family Medicine

## 2019-06-20 MED ORDER — AMPHETAMINE-DEXTROAMPHETAMINE 20 MG PO TABS: 20.0000 mg | ORAL_TABLET | Freq: Two times a day (BID) | ORAL | 0 refills | Status: DC

## 2019-06-27 ENCOUNTER — Telehealth (INDEPENDENT_AMBULATORY_CARE_PROVIDER_SITE_OTHER): Payer: Medicaid Other | Admitting: Family Medicine

## 2019-06-27 ENCOUNTER — Encounter: Payer: Self-pay | Admitting: Family Medicine

## 2019-06-27 VITALS — Wt 117.0 lb

## 2019-06-27 DIAGNOSIS — F988 Other specified behavioral and emotional disorders with onset usually occurring in childhood and adolescence: Secondary | ICD-10-CM

## 2019-06-27 DIAGNOSIS — G8929 Other chronic pain: Secondary | ICD-10-CM

## 2019-06-27 DIAGNOSIS — E559 Vitamin D deficiency, unspecified: Secondary | ICD-10-CM | POA: Diagnosis not present

## 2019-06-27 DIAGNOSIS — M5441 Lumbago with sciatica, right side: Secondary | ICD-10-CM

## 2019-06-27 DIAGNOSIS — R112 Nausea with vomiting, unspecified: Secondary | ICD-10-CM

## 2019-06-27 MED ORDER — ONDANSETRON 4 MG PO TBDP: 4.0000 mg | ORAL_TABLET | Freq: Three times a day (TID) | ORAL | 0 refills | Status: DC | PRN

## 2019-06-27 MED ORDER — OMEPRAZOLE 40 MG PO CPDR: 40.0000 mg | DELAYED_RELEASE_CAPSULE | Freq: Every day | ORAL | 0 refills | Status: DC

## 2019-06-27 MED ORDER — OXYCODONE HCL 5 MG PO TABS: 5.0000 mg | ORAL_TABLET | Freq: Three times a day (TID) | ORAL | 0 refills | Status: DC | PRN

## 2019-06-27 MED ORDER — VITAMIN D (ERGOCALCIFEROL) 1.25 MG (50000 UNIT) PO CAPS
50000.0000 [IU] | ORAL_CAPSULE | ORAL | 3 refills | Status: DC
Start: 2019-06-27 — End: 2022-02-13

## 2019-06-27 NOTE — Progress Notes (Signed)
Wt 117 lb (53.1 kg)   BMI 20.73 kg/m    Subjective:    Patient ID: Cassandra Duarte, female    DOB: [birth year], 34 y.o.   MRN: [id number]  HPI: Cassandra Duarte is a 34 y.o. female  Chief Complaint  Patient presents with  . ADHD    . This visit was completed via MyChart due to the restrictions of the COVID-19 pandemic. All issues as above were discussed and addressed. Physical exam was done as above through visual confirmation on MyChart. If it was felt that the patient should be evaluated in the office, they were directed there. The patient verbally consented to this visit. . Location of the patient: home . Location of the provider: work . Those involved with this call:  . Provider: Merrie Roof, PA-C . CMA: Lesle Chris, [place] . Front Desk/Registration: Jill Side  . Time spent on call: 30 minutes with patient face to face via video conference. More than 50% of this time was spent in counseling and coordination of care. 5 minutes total spent in review of patient's record and preparation of their chart. I verified patient identity using two factors (patient name and date of birth). Patient consents verbally to being seen via telemedicine visit today.   Presenting today for ADHD f/u. Currently on 20 mg adderall BID with 5 mg adderall for prn use in the afternoons. States this regimen is working well for her without side effects. Denies CP, SOB, palpitations, sleep or appetite concerns.   Having a flare up with her low back, which is a chronic issue. Has not felt that pain management was a good fit for her situation in the past because her flares only occur several times per year with strenuous activity and she was told she would need to remain consistently on the pain medication at pain mgmt. Takes oxycodone prn for her flares. Notes they have become less severe since she's lost some weight the past year. Does have sciatica running down right leg during flares. No bowel/bladder  incontinence, fevers, chills. Followed by Orthopedics additionally for this issue. Requesting small supply of pain medication today.   Having intermittent N/V since cholecystectomy, mostly after eating a large meal. Previously had zofran on hand but is out.   Relevant past medical, surgical, family and social history reviewed and updated as indicated. Interim medical history since our last visit reviewed. Allergies and medications reviewed and updated.  Review of Systems  Per HPI unless specifically indicated above     Objective:    Wt 117 lb (53.1 kg)   BMI 20.73 kg/m   Wt Readings from Last 3 Encounters:  06/27/19 117 lb (53.1 kg)  03/26/19 118 lb (53.5 kg)  02/09/19 112 lb (50.8 kg)    Physical Exam Vitals and nursing note reviewed.  Constitutional:      General: She is not in acute distress.    Appearance: Normal appearance.  HENT:     Head: Atraumatic.     Right Ear: External ear normal.     Left Ear: External ear normal.     Nose: Nose normal. No congestion.     Mouth/Throat:     Mouth: Mucous membranes are moist.     Pharynx: Oropharynx is clear. No posterior oropharyngeal erythema.  Eyes:     Extraocular Movements: Extraocular movements intact.     Conjunctiva/sclera: Conjunctivae normal.  Cardiovascular:     Comments: Unable to assess via virtual visit Pulmonary:  Effort: Pulmonary effort is normal. No respiratory distress.  Musculoskeletal:        General: Normal range of motion.     Cervical back: Normal range of motion.  Skin:    General: Skin is dry.     Findings: No erythema.  Neurological:     Mental Status: She is alert and oriented to person, place, and time.  Psychiatric:        Mood and Affect: Mood normal.        Thought Content: Thought content normal.        Judgment: Judgment normal.     Results for orders placed or performed in visit on 04/05/19  WET PREP FOR [place], YEAST, CLUE  Result Value Ref Range   Trichomonas Exam Negative  Negative   Yeast Exam Negative Negative   Clue Cell Exam Comment: Negative      Assessment & Plan:   Problem List Items Addressed This Visit      Nervous and Auditory   Chronic low back pain with right-sided sciatica    Long discussion regarding her intermittent back pain flares, agreeable to small scripts of oxycodone for use during severe flares, no more than one script in 6 months or else she will need to establish with Pain Management for issue or pursue surgery recommended by Orthopedics. Pt agreeable to this plan. Small supply given with precautions      Relevant Medications   oxyCODONE (ROXICODONE) 5 MG immediate release tablet     Other   ADD (attention deficit disorder) - Primary    Stable and well controlled, continue current regimen with close monitoring. PDMP reviewed and appropriate      Vitamin D deficiency    Other Visit Diagnoses    Non-intractable vomiting with nausea, unspecified vomiting type       Zofran sent, discussed avoiding greasy meals or too large of portions post-cholecystectomy       Follow up plan: Return in about 3 months (around 09/27/2019).

## 2019-07-06 ENCOUNTER — Encounter: Payer: Self-pay | Admitting: Family Medicine

## 2019-07-06 ENCOUNTER — Telehealth: Payer: Self-pay | Admitting: Family Medicine

## 2019-07-06 NOTE — Telephone Encounter (Signed)
Copied from [place] [id number]. Topic: General - Inquiry >> Jul 06, 2019 10:03 AM Richardo Priest, NT wrote: Reason for CRM: Patient called in stating she would like to know if PCP can call in her adderall 5mg  in on the 20th instead of 22nd as she will be out of town visiting family and knows that transferring medications to pharmacies can be difficult. Patient would also like to know if PCP will call in more pain medication as she is still having back pain and says it is taking longer than she thought. Please advise.

## 2019-07-06 NOTE — Telephone Encounter (Signed)
Patient checking status of request Call back [phone]

## 2019-07-06 NOTE — Telephone Encounter (Signed)
Pt called to report that she went to the pharmacy and they did not have her Rx. She wanted PCP to know

## 2019-07-06 NOTE — Telephone Encounter (Signed)
Routing to provider  

## 2019-07-09 ENCOUNTER — Other Ambulatory Visit: Payer: Self-pay | Admitting: Family Medicine

## 2019-07-09 MED ORDER — AMPHETAMINE-DEXTROAMPHETAMINE 5 MG PO TABS: 5.0000 mg | ORAL_TABLET | Freq: Every day | ORAL | 0 refills | Status: DC | PRN

## 2019-07-09 NOTE — Telephone Encounter (Signed)
FYI, but I dont think that you had sent it in.

## 2019-07-09 NOTE — Telephone Encounter (Signed)
Patient notified. Can this be sent to the pharmacy below.   [address]  [place], [place] [address]

## 2019-07-09 NOTE — Telephone Encounter (Signed)
Her medication will be available today on due date, I have early filled in the past for her but unfortunately this cannot be a frequent occurrence and will not be able to happen going forward. We had discussed about the pain medication being once script every 6 months or else needing to go seek Pain Mgmt so unfortunately it is too early to refill her oxycodone.

## 2019-07-09 NOTE — Telephone Encounter (Signed)
RX cancelled at [facility].

## 2019-07-09 NOTE — Telephone Encounter (Signed)
Please cancel local rx, new rx sent to [facility]

## 2019-07-09 NOTE — Telephone Encounter (Signed)
Patient notified

## 2019-07-11 ENCOUNTER — Other Ambulatory Visit: Payer: Self-pay | Admitting: Family Medicine

## 2019-07-11 MED ORDER — AMPHETAMINE-DEXTROAMPHETAMINE 5 MG PO TABS
5.0000 mg | ORAL_TABLET | Freq: Every day | ORAL | 0 refills | Status: DC | PRN
Start: 2019-08-19 — End: 2019-08-07

## 2019-07-11 MED ORDER — AMPHETAMINE-DEXTROAMPHETAMINE 20 MG PO TABS: 20.0000 mg | ORAL_TABLET | Freq: Two times a day (BID) | ORAL | 0 refills | Status: DC

## 2019-07-11 NOTE — Addendum Note (Signed)
Addended by: Merrie Roof E on: 07/11/2019 11:20 AM   Modules accepted: Orders

## 2019-07-11 NOTE — Assessment & Plan Note (Signed)
Stable and well controlled, continue current regimen with close monitoring. PDMP reviewed and appropriate

## 2019-07-11 NOTE — Assessment & Plan Note (Signed)
Long discussion regarding her intermittent back pain flares, agreeable to small scripts of oxycodone for use during severe flares, no more than one script in 6 months or else she will need to establish with Pain Management for issue or pursue surgery recommended by Orthopedics. Pt agreeable to this plan. Small supply given with precautions

## 2019-07-11 NOTE — Progress Notes (Signed)
PT stated she would call back to make this apt.

## 2019-07-18 ENCOUNTER — Telehealth: Payer: Self-pay | Admitting: Family Medicine

## 2019-07-18 NOTE — Telephone Encounter (Signed)
PT need a refill amphetamine-dextroamphetamine (ADDERALL) 20 MG tablet [[id number]]  [facility] #[id number], [place] AT [place] [address]  Phone: [phone] Fax: [phone]

## 2019-07-18 NOTE — Telephone Encounter (Signed)
Spoke with pharmacy and they already have script on file and will process on 07/20/2019. Patient is aware that they will get ready on the 07/20/2019.

## 2019-08-06 DIAGNOSIS — S6391XA Sprain of unspecified part of right wrist and hand, initial encounter: Secondary | ICD-10-CM | POA: Diagnosis not present

## 2019-08-07 ENCOUNTER — Other Ambulatory Visit: Payer: Self-pay

## 2019-08-07 ENCOUNTER — Encounter: Payer: Self-pay | Admitting: Family Medicine

## 2019-08-07 ENCOUNTER — Ambulatory Visit (INDEPENDENT_AMBULATORY_CARE_PROVIDER_SITE_OTHER): Payer: Medicaid Other | Admitting: Family Medicine

## 2019-08-07 ENCOUNTER — Telehealth: Payer: Self-pay | Admitting: Family Medicine

## 2019-08-07 VITALS — BP 109/71 | Temp 98.3°F | Wt 111.0 lb

## 2019-08-07 DIAGNOSIS — F988 Other specified behavioral and emotional disorders with onset usually occurring in childhood and adolescence: Secondary | ICD-10-CM

## 2019-08-07 DIAGNOSIS — R634 Abnormal weight loss: Secondary | ICD-10-CM | POA: Diagnosis not present

## 2019-08-07 DIAGNOSIS — M79641 Pain in right hand: Secondary | ICD-10-CM

## 2019-08-07 MED ORDER — AMPHETAMINE-DEXTROAMPHETAMINE 5 MG PO TABS: 5.0000 mg | ORAL_TABLET | Freq: Every day | ORAL | 0 refills | Status: DC

## 2019-08-07 NOTE — Telephone Encounter (Signed)
Waiting on provider response as no pain medication has been sent in.

## 2019-08-07 NOTE — Telephone Encounter (Signed)
Patient called to ask the nurse or doctor to call regarding her pain medication which is at the pharmacy.  The pharmacy said she could not get the medication because it was not written from [facility].  Patient would like a call so she can explain the situation more in detail.  CB# [phone]
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
## 2019-08-07 NOTE — Telephone Encounter (Signed)
I did not speak with her today about sending in pain medication, only her adderall. She's working with orthopedics on her fracture care

## 2019-08-08 NOTE — Telephone Encounter (Signed)
Called pt. VM box full. Will try again later

## 2019-08-09 NOTE — Telephone Encounter (Signed)
Patient notified of Rachel's message. Patient stated that the pharmacy advised her to request her PCP for pain medication as she is her PCP, not the orthopedics. Also, pt stated it has been 2 day now so that she does not need the pain medication anymore at this time.

## 2019-08-12 NOTE — Assessment & Plan Note (Signed)
Refill 5 mg prn adderall, continue 20 mg adderall. Continue to monitor closely

## 2019-08-21 ENCOUNTER — Encounter: Payer: Medicaid Other | Admitting: Family Medicine

## 2019-09-08 ENCOUNTER — Other Ambulatory Visit: Payer: Self-pay | Admitting: Family Medicine

## 2019-09-10 ENCOUNTER — Other Ambulatory Visit: Payer: Self-pay | Admitting: Family Medicine

## 2019-09-10 MED ORDER — AMPHETAMINE-DEXTROAMPHETAMINE 5 MG PO TABS: 5.0000 mg | ORAL_TABLET | Freq: Every day | ORAL | 0 refills | Status: DC

## 2019-09-10 NOTE — Telephone Encounter (Signed)
Requested medication (s) are due for refill today: Yes  Requested medication (s) are on the active medication list: Yes  Last refill:  08/07/19  Future visit scheduled: Yes  Notes to clinic:  See request.    Requested Prescriptions  Pending Prescriptions Disp Refills   amphetamine-dextroamphetamine (ADDERALL) 5 MG tablet 30 tablet 0    Sig: Take 1 tablet (5 mg total) by mouth daily.      Not Delegated - Psychiatry:  Stimulants/ADHD Failed - 09/10/2019  9:21 AM      Failed - This refill cannot be delegated      Failed - Urine Drug Screen completed in last 360 days.      Passed - Valid encounter within last 3 months    Recent Outpatient Visits           1 month ago Attention deficit disorder (ADD) without hyperactivity   [place], [place]   2 months ago Attention deficit disorder (ADD) without hyperactivity   [place], [place], [place]   5 months ago [place], [place], [place]   8 months ago [place], [place], [place]   8 months ago Acute maxillary sinusitis, recurrence not specified   [facility] [place], [place]       Future Appointments             In 4 weeks Eulogio Bear, NP [facility], [place]

## 2019-09-10 NOTE — Telephone Encounter (Signed)
Copied from [place] [id number]. Topic: Quick Communication - Rx Refill/Question >> Sep 10, 2019  9:10 AM Leward Quan A wrote: Medication: amphetamine-dextroamphetamine (ADDERALL) 5 MG tablet   Has the patient contacted their pharmacy? Yes.   (Agent: If no, request that the patient contact the pharmacy for the refill.) (Agent: If yes, when and what did the pharmacy advise?)  Preferred Pharmacy (with phone number or street name): [id number] [address], [facility]  Phone:  [phone] Fax:  [phone]     Agent: Please be advised that RX refills may take up to 3 business days. We ask that you follow-up with your pharmacy.

## 2019-09-19 ENCOUNTER — Other Ambulatory Visit: Payer: Self-pay | Admitting: Family Medicine

## 2019-09-19 ENCOUNTER — Other Ambulatory Visit: Payer: Self-pay

## 2019-09-19 NOTE — Telephone Encounter (Signed)
Medication Refill - Medication: amphetamine-dextroamphetamine (ADDERALL) 20 MG tablet     Preferred Pharmacy (with phone number or street name): [facility] #[id number] [address], [place] [facility] Phone:  [phone]  Fax:  [phone]       Agent: Please be advised that RX refills may take up to 3 business days. We ask that you follow-up with your pharmacy.

## 2019-09-19 NOTE — Telephone Encounter (Signed)
Requested medication (s) are due for refill today: Yes  Requested medication (s) are on the active medication list: Yes  Last refill:  07/11/19  Future visit scheduled: Yes  Notes to clinic:  See request.    Requested Prescriptions  Pending Prescriptions Disp Refills   amphetamine-dextroamphetamine (ADDERALL) 20 MG tablet 60 tablet 0    Sig: Take 1 tablet (20 mg total) by mouth 2 (two) times daily.      Not Delegated - Psychiatry:  Stimulants/ADHD Failed - 09/19/2019 10:51 AM      Failed - This refill cannot be delegated      Failed - Urine Drug Screen completed in last 360 days.      Passed - Valid encounter within last 3 months    Recent Outpatient Visits           1 month ago Attention deficit disorder (ADD) without hyperactivity   [place], [place]   2 months ago Attention deficit disorder (ADD) without hyperactivity   [place], [place], [place]   5 months ago [place], [place], [place]   8 months ago [place], [place], [place]   8 months ago Acute maxillary sinusitis, recurrence not specified   [facility] [place], [place]       Future Appointments             In 2 weeks Eulogio Bear, NP [facility], [place]

## 2019-09-19 NOTE — Telephone Encounter (Signed)
LOV: 08/07/19 NOV: 10/09/19

## 2019-09-21 ENCOUNTER — Encounter: Payer: Self-pay | Admitting: Family Medicine

## 2019-09-21 ENCOUNTER — Telehealth (INDEPENDENT_AMBULATORY_CARE_PROVIDER_SITE_OTHER): Payer: Medicaid Other | Admitting: Family Medicine

## 2019-09-21 VITALS — Wt 118.0 lb

## 2019-09-21 DIAGNOSIS — F988 Other specified behavioral and emotional disorders with onset usually occurring in childhood and adolescence: Secondary | ICD-10-CM | POA: Diagnosis not present

## 2019-09-21 MED ORDER — AMPHETAMINE-DEXTROAMPHETAMINE 20 MG PO TABS: 20.0000 mg | ORAL_TABLET | Freq: Two times a day (BID) | ORAL | 0 refills | Status: DC

## 2019-09-21 MED ORDER — AMPHETAMINE-DEXTROAMPHETAMINE 5 MG PO TABS
5.0000 mg | ORAL_TABLET | Freq: Every day | ORAL | 0 refills | Status: DC
Start: 2019-11-09 — End: 2019-10-23

## 2019-09-21 MED ORDER — AMPHETAMINE-DEXTROAMPHETAMINE 5 MG PO TABS: 5.0000 mg | ORAL_TABLET | Freq: Every day | ORAL | 0 refills | Status: DC

## 2019-09-21 NOTE — Assessment & Plan Note (Signed)
Under good control on current regimen. Continue current regimen. Continue to monitor. Call with any concerns. Refills given for 3 months. Follow up in 3 months.   

## 2019-09-21 NOTE — Progress Notes (Signed)
Wt 118 lb (53.5 kg)   LMP  (LMP Unknown)   BMI 20.90 kg/m    Subjective:    Patient ID: Cassandra Duarte, female    DOB: Aug 07, 1985, 34 y.o.   MRN: [id number]  HPI: Cassandra Duarte is a 34 y.o. female  Chief Complaint  Patient presents with  . ADD   ADHD FOLLOW UP ADHD status: stable Satisfied with current therapy: yes Medication compliance:  excellent compliance Controlled substance contract: yes Previous psychiatry evaluation: no Previous medications: yes adderall   Taking meds on weekends/vacations: yes Work/school performance:  good Difficulty sustaining attention/completing tasks: no Distracted by extraneous stimuli: no Does not listen when spoken to: no  Fidgets with hands or feet: no Unable to stay in seat: no Blurts out/interrupts others: no ADHD Medication Side Effects: no    Decreased appetite: no    Headache: no    Sleeping disturbance pattern: no    Irritability: no    Rebound effects (worse than baseline) off medication: no    Anxiousness: no    Dizziness: no    Tics: no   Relevant past medical, surgical, family and social history reviewed and updated as indicated. Interim medical history since our last visit reviewed. Allergies and medications reviewed and updated.  Review of Systems  Constitutional: Negative.   Respiratory: Negative.   Cardiovascular: Negative.   Gastrointestinal: Negative.   Musculoskeletal: Negative.   Skin: Negative.   Neurological: Negative.   Psychiatric/Behavioral: Positive for decreased concentration. Negative for agitation, behavioral problems, confusion, dysphoric mood, hallucinations, self-injury, sleep disturbance and suicidal ideas. The patient is not nervous/anxious and is not hyperactive.     Per HPI unless specifically indicated above     Objective:    Wt 118 lb (53.5 kg)   LMP  (LMP Unknown)   BMI 20.90 kg/m   Wt Readings from Last 3 Encounters:  09/21/19 118 lb (53.5 kg)  08/07/19 111 lb  (50.3 kg)  06/27/19 117 lb (53.1 kg)    Physical Exam Vitals and nursing note reviewed.  Constitutional:      General: She is not in acute distress.    Appearance: Normal appearance. She is not ill-appearing, toxic-appearing or diaphoretic.  HENT:     Head: Normocephalic and atraumatic.     Right Ear: External ear normal.     Left Ear: External ear normal.     Nose: Nose normal.     Mouth/Throat:     Mouth: Mucous membranes are moist.     Pharynx: Oropharynx is clear.  Eyes:     General: No scleral icterus.       Right eye: No discharge.        Left eye: No discharge.     Conjunctiva/sclera: Conjunctivae normal.     Pupils: Pupils are equal, round, and reactive to light.  Pulmonary:     Effort: Pulmonary effort is normal. No respiratory distress.     Comments: Speaking in full sentences Musculoskeletal:        General: Normal range of motion.     Cervical back: Normal range of motion.  Skin:    Coloration: Skin is not jaundiced or pale.     Findings: No bruising, erythema, lesion or rash.  Neurological:     Mental Status: She is alert and oriented to person, place, and time. Mental status is at baseline.  Psychiatric:        Mood and Affect: Mood normal.  Behavior: Behavior normal.        Thought Content: Thought content normal.        Judgment: Judgment normal.     Results for orders placed or performed in visit on 04/05/19  WET PREP FOR [place], YEAST, CLUE  Result Value Ref Range   Trichomonas Exam Negative Negative   Yeast Exam Negative Negative   Clue Cell Exam Comment: Negative      Assessment & Plan:   Problem List Items Addressed This Visit      Other   ADD (attention deficit disorder) - Primary    Under good control on current regimen. Continue current regimen. Continue to monitor. Call with any concerns. Refills given for 3 months. Follow up in 3 months.             Follow up plan: Return in about 3 months (around 12/21/2019) for before  11/22.    . This visit was completed via MyChart due to the restrictions of the COVID-19 pandemic. All issues as above were discussed and addressed. Physical exam was done as above through visual confirmation on MyChart. If it was felt that the patient should be evaluated in the office, they were directed there. The patient verbally consented to this visit. . Location of the patient: home . Location of the provider: work . Those involved with this call:  . Provider: Park Liter, DO . CMA: Lauretta Grill, RMA . Front Desk/Registration: Don Perking  . Time spent on call: 15 minutes with patient face to face via video conference. More than 50% of this time was spent in counseling and coordination of care. 23 minutes total spent in review of patient's record and preparation of their chart.

## 2019-09-27 ENCOUNTER — Telehealth (INDEPENDENT_AMBULATORY_CARE_PROVIDER_SITE_OTHER): Payer: Medicaid Other | Admitting: Family Medicine

## 2019-09-27 ENCOUNTER — Encounter: Payer: Self-pay | Admitting: Family Medicine

## 2019-09-27 VITALS — Wt 117.0 lb

## 2019-09-27 DIAGNOSIS — M5442 Lumbago with sciatica, left side: Secondary | ICD-10-CM

## 2019-09-27 DIAGNOSIS — M5416 Radiculopathy, lumbar region: Secondary | ICD-10-CM | POA: Diagnosis not present

## 2019-09-27 MED ORDER — BACLOFEN 10 MG PO TABS: 10.0000 mg | ORAL_TABLET | Freq: Every evening | ORAL | 0 refills | Status: DC | PRN

## 2019-09-27 MED ORDER — PREDNISONE 50 MG PO TABS: 50.0000 mg | ORAL_TABLET | Freq: Every day | ORAL | 0 refills | Status: DC
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
## 2019-10-02 ENCOUNTER — Encounter: Payer: Self-pay | Admitting: Family Medicine

## 2019-10-02 ENCOUNTER — Telehealth: Payer: Self-pay | Admitting: Family Medicine

## 2019-10-02 NOTE — Telephone Encounter (Signed)
She said she was going to ortho. Is she going to ortho?

## 2019-10-02 NOTE — Telephone Encounter (Signed)
Patient called to inform the doctor that her back is still in pain after taking the prednisone.  Please call patient to discuss at [phone]

## 2019-10-03 NOTE — Telephone Encounter (Signed)
See mychart message from patient, she did see Ortho according to that.

## 2019-10-09 ENCOUNTER — Ambulatory Visit: Payer: Medicaid Other | Admitting: Nurse Practitioner

## 2019-10-09 ENCOUNTER — Ambulatory Visit: Payer: Medicaid Other | Admitting: Family Medicine

## 2019-10-22 ENCOUNTER — Ambulatory Visit: Admit: 2019-10-22 | Discharge status: Against Medical Advice | Payer: Medicaid Other

## 2019-10-23 ENCOUNTER — Ambulatory Visit
Admission: EM | Admit: 2019-10-23 | Discharge: 2019-10-23 | Disposition: A | Discharge status: Home / Self Care | Payer: Medicaid Other | Attending: Emergency Medicine | Admitting: Emergency Medicine

## 2019-10-23 ENCOUNTER — Ambulatory Visit: Payer: Self-pay | Admitting: *Deleted

## 2019-10-23 ENCOUNTER — Other Ambulatory Visit: Payer: Self-pay

## 2019-10-23 VITALS — BP 122/92 | HR 100 | Temp 98.2°F | Resp 16 | Ht 63.0 in | Wt 118.0 lb

## 2019-10-23 DIAGNOSIS — S161XXA Strain of muscle, fascia and tendon at neck level, initial encounter: Secondary | ICD-10-CM

## 2019-10-23 DIAGNOSIS — T304 Corrosion of unspecified body region, unspecified degree: Secondary | ICD-10-CM | POA: Diagnosis not present

## 2019-10-23 MED ORDER — METHYLPREDNISOLONE 4 MG PO TBPK: ORAL_TABLET | ORAL | 0 refills | Status: DC

## 2019-10-23 MED ORDER — DIAZEPAM 5 MG PO TABS: 5.0000 mg | ORAL_TABLET | ORAL | 0 refills | Status: AC

## 2019-10-23 MED ORDER — METHOCARBAMOL 500 MG PO TABS: 500.0000 mg | ORAL_TABLET | Freq: Two times a day (BID) | ORAL | 0 refills | Status: DC
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
## 2019-10-23 NOTE — Telephone Encounter (Signed)
Noted, agree with walk in.
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
## 2019-10-29 ENCOUNTER — Ambulatory Visit: Payer: Medicaid Other | Admitting: Family Medicine

## 2019-10-29 DIAGNOSIS — S161XXA Strain of muscle, fascia and tendon at neck level, initial encounter: Secondary | ICD-10-CM | POA: Diagnosis not present

## 2019-11-27 ENCOUNTER — Encounter: Payer: Self-pay | Admitting: Nurse Practitioner

## 2019-11-27 ENCOUNTER — Telehealth (INDEPENDENT_AMBULATORY_CARE_PROVIDER_SITE_OTHER): Payer: Medicaid Other | Admitting: Nurse Practitioner

## 2019-11-27 DIAGNOSIS — R21 Rash and other nonspecific skin eruption: Secondary | ICD-10-CM

## 2019-11-27 NOTE — Progress Notes (Signed)
There were no vitals taken for this visit.   Subjective:    Patient ID: Cassandra Duarte, female    DOB: 1985-05-22, 34 y.o.   MRN: [id number]  HPI: Cassandra Duarte is a 34 y.o. female presenting with rash.  Chief Complaint  Patient presents with  . Rash    pt states she has a rash that has come up on the R side of her face, R shoulder, and R upper back. States this came up about a few ago and started blistering. She states it is very painful.    SHINGLES Has had chicken pox; history of "fever blisters on nose."   Thinks she may have shingles.  Duration: days - ~1 week Location: right side of face, right shoulder, right upper back Painful:  yes Severity: 9/10  Paresthesia:  no Hyperesthesia: yes Itching:  no Burning:  yes Oozing:  yes Blisters:  yes Fevers:  no History of the same:  no Alleviating factors: nothing Status: worse Treatments attempted: Abreeva; triple antibiotic ointment, Valtrex  Allergies  Allergen Reactions  . Magnesium-Containing Compounds Anaphylaxis  . Toradol [Ketorolac Tromethamine] Rash  . Tramadol Shortness Of Breath and Itching  . Tamiflu [Oseltamivir Phosphate] Nausea And Vomiting    intractible  . Tylenol [Acetaminophen] Nausea And Vomiting    Lesions on liver per MD  . Gabapentin Other (See Comments)    nightmares   Outpatient Encounter Medications as of 11/27/2019  Medication Sig  . amphetamine-dextroamphetamine (ADDERALL) 20 MG tablet Take 20 mg by mouth 2 (two) times daily.  Marland Kitchen amphetamine-dextroamphetamine (ADDERALL) 5 MG tablet Take 1 tablet by mouth daily.  . cholecalciferol (VITAMIN D3) 25 MCG (1000 UNIT) tablet Take 1,000 Units by mouth daily.  Marland Kitchen ibuprofen (ADVIL) 800 MG tablet Take 1 tablet (800 mg total) by mouth every 8 (eight) hours as needed for mild pain or moderate pain.  . methocarbamol (ROBAXIN) 500 MG tablet Take 1 tablet (500 mg total) by mouth 2 (two) times daily.  . Multiple Vitamin (MULTIVITAMIN PO) Take 1  tablet by mouth daily.   . Vitamin D, Ergocalciferol, (DRISDOL) 1.25 MG (50000 UNIT) CAPS capsule Take 1 capsule (50,000 Units total) by mouth every 7 (seven) days. (Patient not taking: Reported on 11/27/2019)  . [DISCONTINUED] amphetamine-dextroamphetamine (ADDERALL) 5 MG tablet Take 1 tablet (5 mg total) by mouth daily. (Patient not taking: Reported on 09/27/2019)  . [DISCONTINUED] methylPREDNISolone (MEDROL DOSEPAK) 4 MG TBPK tablet Take according to package insert  . [DISCONTINUED] omeprazole (PRILOSEC) 40 MG capsule Take 1 capsule (40 mg total) by mouth daily.   No facility-administered encounter medications on file as of 11/27/2019.   Patient Active Problem List   Diagnosis Date Noted  . Rash and nonspecific skin eruption 11/28/2019  . Vitamin D deficiency 06/27/2019  . History of bilateral tubal ligation 04/06/2019  . Anxiety 01/15/2019  . Controlled substance agreement signed 11/29/2017  . Dysuria 10/10/2017  . Status post laparoscopy with adhesio lysis 07/13/2017  . Chronic pain syndrome 06/29/2017  . 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 Myofascial pain 06/29/2017  . ADD (attention deficit disorder) 05/26/2017  . Menorrhagia with regular cycle 05/03/2017  . Dysmenorrhea 05/03/2017  . Family history of breast cancer 10/01/2015  . Chronic low back pain with right-sided sciatica 10/09/2014   Past Medical History:  Diagnosis Date  . ADHD   . Anxiety   . Family history of adverse reaction to anesthesia    dad-n/v  . Fatty liver   .  GERD (gastroesophageal reflux disease)   . History of kidney stones    h/o  . Liver cyst   . Vaginal Pap smear, abnormal 2012   REPEAT AND WAS NORMAL   Relevant past medical, surgical, family and social history reviewed and updated as indicated. Interim medical history since our last visit reviewed.  Review of Systems  Constitutional: Negative.  Negative for fever.  Skin: Positive for color change and rash. Negative for pallor and wound.    Neurological: Negative.   Psychiatric/Behavioral: Negative.     Per HPI unless specifically indicated above     Objective:    There were no vitals taken for this visit.  Wt Readings from Last 3 Encounters:  10/23/19 118 lb (53.5 kg)  09/27/19 117 lb (53.1 kg)  09/21/19 118 lb (53.5 kg)    Physical Exam Vitals and nursing note reviewed.  Constitutional:      General: She is not in acute distress.    Appearance: Normal appearance. She is not toxic-appearing.  Eyes:     General: No scleral icterus.    Extraocular Movements: Extraocular movements intact.  Pulmonary:     Comments: Patient talking in complete sentences.  No accessory muscle use. Skin:    Coloration: Skin is not jaundiced or pale.     Comments: Erythematous rash noted to right side of neck, right side of nose.  Unable to visualize distinct vesicles.  Neurological:     Mental Status: She is alert and oriented to person, place, and time.  Psychiatric:        Attention and Perception: Attention normal.        Mood and Affect: Mood is anxious.        Behavior: Behavior is agitated.        Thought Content: Thought content normal.        Cognition and Memory: Cognition and memory normal.        Judgment: Judgment normal.        Assessment & Plan:   Problem List Items Addressed This Visit      Musculoskeletal and Integument   Rash and nonspecific skin eruption - Primary    Acute, ongoing.  Unclear etiology, although herpes zoster seems likely with recent increase stress and history of chickenpox.  Would be uncommon to find rash along 3 different dermatomes however not impossible.  Patient already taking Valtrex twice daily since rash started.  With no benefit from Valtrex, offered multiple different evidence-based medications such as gabapentin and amitriptyline.  Patient declined these and said they have not worked well for her in the past.  With request for something " stronger to help her sleep," educated that  stronger medication is not indicated for shingles.  Patient appeared frustrated with this, however verbalized understanding.          Follow up plan: Return if symptoms worsen or fail to improve.   Due to the catastrophic nature of the COVID-19 pandemic, this visit was completed via audio and visual contact via [employer] due to the restrictions of the COVID-19 pandemic. All issues as above were discussed and addressed. Physical exam was done as above through visual confirmation on [employer]. If it was felt that the patient should be evaluated in the office, they were directed there. The patient verbally consented to this visit."} . Location of the patient: parking lot . Location of the provider: work . Those involved with this call:  . Provider: Carnella Guadalajara, DNP . CMA: Yvonna Alanis, CMA .  Front Desk/Registration: Jill Side  . Time spent on call: 15 minutes with patient face to face via video conference. More than 50% of this time was spent in counseling and coordination of care. 30 minutes total spent in review of patient's record and preparation of their chart.  I verified patient identity using two factors (patient name and date of birth). Patient consents verbally to being seen via telemedicine visit today.

## 2019-11-28 DIAGNOSIS — R21 Rash and other nonspecific skin eruption: Secondary | ICD-10-CM | POA: Insufficient documentation

## 2019-11-28 DIAGNOSIS — B029 Zoster without complications: Secondary | ICD-10-CM | POA: Diagnosis not present

## 2019-11-28 NOTE — Assessment & Plan Note (Signed)
Acute, ongoing.  Unclear etiology, although herpes zoster seems likely with recent increase stress and history of chickenpox.  Would be uncommon to find rash along 3 different dermatomes however not impossible.  Patient already taking Valtrex twice daily since rash started.  With no benefit from Valtrex, offered multiple different evidence-based medications such as gabapentin and amitriptyline.  Patient declined these and said they have not worked well for her in the past.  With request for something " stronger to help her sleep," educated that stronger medication is not indicated for shingles.  Patient appeared frustrated with this, however verbalized understanding.

## 2019-11-28 NOTE — Patient Instructions (Signed)
Shingles  Shingles, which is also known as herpes zoster, is an infection that causes a painful skin rash and fluid-filled blisters. It is caused by a virus. Shingles only develops in people who:  Have had chickenpox.  Have been given a medicine to protect against chickenpox (have been vaccinated). Shingles is rare in this group. What are the causes? Shingles is caused by varicella-zoster virus (VZV). This is the same virus that causes chickenpox. After a person is exposed to VZV, the virus stays in the body in an inactive (dormant) state. Shingles develops if the virus is reactivated. This can happen many years after the first (initial) exposure to VZV. It is not known what causes this virus to be reactivated. What increases the risk? People who have had chickenpox or received the chickenpox vaccine are at risk for shingles. Shingles infection is more common in people who:  Are older than age 60.  Have a weakened disease-fighting system (immune system), such as people with: ? HIV. ? AIDS. ? Cancer.  Are taking medicines that weaken the immune system, such as transplant medicines.  Are experiencing a lot of stress. What are the signs or symptoms? Early symptoms of this condition include itching, tingling, and pain in an area on your skin. Pain may be described as burning, stabbing, or throbbing. A few days or weeks after early symptoms start, a painful red rash appears. The rash is usually on one side of the body and has a band-like or belt-like pattern. The rash eventually turns into fluid-filled blisters that break open, change into scabs, and dry up in about 2-3 weeks. At any time during the infection, you may also develop:  A fever.  Chills.  A headache.  An upset stomach. How is this diagnosed? This condition is diagnosed with a skin exam. Skin or fluid samples may be taken from the blisters before a diagnosis is made. These samples are examined under a microscope or sent to  a lab for testing. How is this treated? The rash may last for several weeks. There is not a specific cure for this condition. Your health care provider will probably prescribe medicines to help you manage pain, recover more quickly, and avoid long-term problems. Medicines may include:  Antiviral drugs.  Anti-inflammatory drugs.  Pain medicines.  Anti-itching medicines (antihistamines). If the area involved is on your face, you may be referred to a specialist, such as an eye doctor (ophthalmologist) or an ear, nose, and throat (ENT) doctor (otolaryngologist) to help you avoid eye problems, chronic pain, or disability. Follow these instructions at home: Medicines  Take over-the-counter and prescription medicines only as told by your health care provider.  Apply an anti-itch cream or numbing cream to the affected area as told by your health care provider. Relieving itching and discomfort   Apply cold, wet cloths (cold compresses) to the area of the rash or blisters as told by your health care provider.  Cool baths can be soothing. Try adding baking soda or dry oatmeal to the water to reduce itching. Do not bathe in hot water. Blister and rash care  Keep your rash covered with a loose bandage (dressing). Wear loose-fitting clothing to help ease the pain of material rubbing against the rash.  Keep your rash and blisters clean by washing the area with mild soap and cool water as told by your health care provider.  Check your rash every day for signs of infection. Check for: ? More redness, swelling, or pain. ? Fluid   or blood. ? Warmth. ? Pus or a bad smell.  Do not scratch your rash or pick at your blisters. To help avoid scratching: ? Keep your fingernails clean and cut short. ? Wear gloves or mittens while you sleep, if scratching is a problem. General instructions  Rest as told by your health care provider.  Keep all follow-up visits as told by your health care provider. This  is important.  Wash your hands often with soap and water. If soap and water are not available, use hand sanitizer. Doing this lowers your chance of getting a bacterial skin infection.  Before your blisters change into scabs, your shingles infection can cause chickenpox in people who have never had it or have never been vaccinated against it. To prevent this from happening, avoid contact with other people, especially: ? Babies. ? Pregnant women. ? Children who have eczema. ? Elderly people who have transplants. ? People who have chronic illnesses, such as cancer or AIDS. Contact a health care provider if:  Your pain is not relieved with prescribed medicines.  Your pain does not get better after the rash heals.  You have signs of infection in the rash area, such as: ? More redness, swelling, or pain around the rash. ? Fluid or blood coming from the rash. ? The rash area feeling warm to the touch. ? Pus or a bad smell coming from the rash. Get help right away if:  The rash is on your face or nose.  You have facial pain, pain around your eye area, or loss of feeling on one side of your face.  You have difficulty seeing.  You have ear pain or have ringing in your ear.  You have a loss of taste.  Your condition gets worse. Summary  Shingles, which is also known as herpes zoster, is an infection that causes a painful skin rash and fluid-filled blisters.  This condition is diagnosed with a skin exam. Skin or fluid samples may be taken from the blisters and examined before the diagnosis is made.  Keep your rash covered with a loose bandage (dressing). Wear loose-fitting clothing to help ease the pain of material rubbing against the rash.  Before your blisters change into scabs, your shingles infection can cause chickenpox in people who have never had it or have never been vaccinated against it. This information is not intended to replace advice given to you by your health care  provider. Make sure you discuss any questions you have with your health care provider. Document Revised: 04/28/2018 Document Reviewed: 09/08/2016 Elsevier Patient Education  2020 [employer] Inc.  

## 2019-12-10 ENCOUNTER — Telehealth (INDEPENDENT_AMBULATORY_CARE_PROVIDER_SITE_OTHER): Payer: Medicaid Other | Admitting: Family Medicine

## 2019-12-10 ENCOUNTER — Encounter: Payer: Self-pay | Admitting: Family Medicine

## 2019-12-10 DIAGNOSIS — F988 Other specified behavioral and emotional disorders with onset usually occurring in childhood and adolescence: Secondary | ICD-10-CM

## 2019-12-10 DIAGNOSIS — B0229 Other postherpetic nervous system involvement: Secondary | ICD-10-CM | POA: Diagnosis not present

## 2019-12-10 MED ORDER — AMPHETAMINE-DEXTROAMPHETAMINE 20 MG PO TABS: 20.0000 mg | ORAL_TABLET | Freq: Two times a day (BID) | ORAL | 0 refills | Status: DC

## 2019-12-10 MED ORDER — AMPHETAMINE-DEXTROAMPHETAMINE 20 MG PO TABS
20.0000 mg | ORAL_TABLET | Freq: Two times a day (BID) | ORAL | 0 refills | Status: DC
Start: 2019-12-10 — End: 2020-03-17

## 2019-12-10 MED ORDER — AMPHETAMINE-DEXTROAMPHETAMINE 5 MG PO TABS: 5.0000 mg | ORAL_TABLET | Freq: Every day | ORAL | 0 refills | Status: DC

## 2019-12-10 MED ORDER — NORTRIPTYLINE HCL 25 MG PO CAPS: 25.0000 mg | ORAL_CAPSULE | Freq: Every day | ORAL | 3 refills | Status: DC

## 2019-12-10 MED ORDER — AMPHETAMINE-DEXTROAMPHETAMINE 5 MG PO TABS
1.0000 | ORAL_TABLET | Freq: Every day | ORAL | 0 refills | Status: DC
Start: 2019-12-10 — End: 2020-03-17

## 2019-12-10 NOTE — Assessment & Plan Note (Signed)
Going on about 3 weeks now. Cannot tolerate gabapentin. Rx for nortriptyline sent to her pharmacy today. Continue to monitor. Call with any concerns.

## 2019-12-10 NOTE — Progress Notes (Signed)
There were no vitals taken for this visit.   Subjective:    Patient ID: Cassandra Duarte, female    DOB: 1985/12/25, 34 y.o.   MRN: [id number]  HPI: Cassandra Duarte is a 34 y.o. female  Chief Complaint  Patient presents with  . Herpes Zoster  . ADHD   Has been getting over shingles. Starting to feel a little better, but continues with a lot of pain. She has seen UC. She was given prednisone, valtrex and gabapentin. She did not take gabapentin as it makes her have terrible nightmares. She has been in quite a bit of pain. She is concerned about how long her pain is going to last.   ADHD FOLLOW UP ADHD status: controlled Satisfied with current therapy: yes Medication compliance:  excellent compliance Controlled substance contract: yes Previous psychiatry evaluation: yes Previous medications: yes    Taking meds on weekends/vacations: yes Work/school performance:  good Difficulty sustaining attention/completing tasks: no Distracted by extraneous stimuli: no Does not listen when spoken to: no  Fidgets with hands or feet: no Unable to stay in seat: no Blurts out/interrupts others: no ADHD Medication Side Effects: no    Decreased appetite: no    Headache: no    Sleeping disturbance pattern: no    Irritability: no    Rebound effects (worse than baseline) off medication: no    Anxiousness: no    Dizziness: no    Tics: no  Relevant past medical, surgical, family and social history reviewed and updated as indicated. Interim medical history since our last visit reviewed. Allergies and medications reviewed and updated.  Review of Systems  Constitutional: Negative.   Respiratory: Negative.   Cardiovascular: Negative.   Gastrointestinal: Negative.   Musculoskeletal: Positive for back pain and myalgias. Negative for arthralgias, gait problem, joint swelling, neck pain and neck stiffness.  Skin: Negative.   Neurological: Negative.   Psychiatric/Behavioral: Negative.      Per HPI unless specifically indicated above     Objective:    There were no vitals taken for this visit.  Wt Readings from Last 3 Encounters:  10/23/19 118 lb (53.5 kg)  09/27/19 117 lb (53.1 kg)  09/21/19 118 lb (53.5 kg)    Physical Exam Vitals and nursing note reviewed.  Constitutional:      General: She is not in acute distress.    Appearance: Normal appearance. She is not ill-appearing, toxic-appearing or diaphoretic.  HENT:     Head: Normocephalic and atraumatic.     Right Ear: External ear normal.     Left Ear: External ear normal.     Nose: Nose normal.     Mouth/Throat:     Mouth: Mucous membranes are moist.     Pharynx: Oropharynx is clear.  Eyes:     General: No scleral icterus.       Right eye: No discharge.        Left eye: No discharge.     Conjunctiva/sclera: Conjunctivae normal.     Pupils: Pupils are equal, round, and reactive to light.  Pulmonary:     Effort: Pulmonary effort is normal. No respiratory distress.     Comments: Speaking in full sentences Musculoskeletal:        General: Normal range of motion.     Cervical back: Normal range of motion.  Skin:    Coloration: Skin is not jaundiced or pale.     Findings: No bruising, erythema, lesion or rash.  Neurological:  Mental Status: She is alert and oriented to person, place, and time. Mental status is at baseline.  Psychiatric:        Mood and Affect: Mood normal.        Behavior: Behavior normal.        Thought Content: Thought content normal.        Judgment: Judgment normal.     Results for orders placed or performed in visit on 04/05/19  WET PREP FOR [place], YEAST, CLUE  Result Value Ref Range   Trichomonas Exam Negative Negative   Yeast Exam Negative Negative   Clue Cell Exam Comment: Negative      Assessment & Plan:   Problem List Items Addressed This Visit      Nervous and Auditory   Postherpetic neuralgia - Primary    Going on about 3 weeks now. Cannot tolerate  gabapentin. Rx for nortriptyline sent to her pharmacy today. Continue to monitor. Call with any concerns.         Other   ADD (attention deficit disorder)    Under good control on current regimen. Continue current regimen. Continue to monitor. Call with any concerns. Refills given for 3 months. Follow up 3 months.            Follow up plan: Return in about 4 weeks (around 01/07/2020) for follow up post herpetic neuralgia.   . This visit was completed via MyChart due to the restrictions of the COVID-19 pandemic. All issues as above were discussed and addressed. Physical exam was done as above through visual confirmation on MyChart. If it was felt that the patient should be evaluated in the office, they were directed there. The patient verbally consented to this visit. . Location of the patient: parking lot . Location of the provider: work . Those involved with this call:  . Provider: Park Liter, DO . CMA: Louanna Raw, [place] . Front Desk/Registration: Jill Side  . Time spent on call: 25 minutes with patient face to face via video conference. More than 50% of this time was spent in counseling and coordination of care. 40 minutes total spent in review of patient's record and preparation of their chart.

## 2019-12-10 NOTE — Assessment & Plan Note (Signed)
Under good control on current regimen. Continue current regimen. Continue to monitor. Call with any concerns. Refills given for 3 months. Follow up 3 months.    

## 2019-12-18 ENCOUNTER — Telehealth: Payer: Self-pay

## 2019-12-18 NOTE — Telephone Encounter (Signed)
erroneous error  

## 2019-12-18 NOTE — Telephone Encounter (Signed)
Pt would like to know if Dr.Johson will take her on as a patient as she does not want to see new provider who is taking over [facility] PA-C  patients, Pt stated new pcp would be a FNP and  she is not a MD and would like a MD as new PCP. Pt wanted me to reach out to Dr.Johnson to see if she would have her for new PCP instead.

## 2019-12-18 NOTE — Telephone Encounter (Signed)
I am not taking on new patients unfortunately, but she can absolutely see Dr. Neomia Dear, who is a physician.

## 2019-12-20 NOTE — Telephone Encounter (Signed)
Pt made aware. Pt verbalized undertanding.

## 2020-01-14 ENCOUNTER — Telehealth: Payer: Self-pay

## 2020-01-14 NOTE — Telephone Encounter (Signed)
Pt has apt on 01/15/2020 does have virtual capabilities but does not have a way to do BP can this abe a virtual or in office? Pt did stated leg in pain persist

## 2020-01-14 NOTE — Telephone Encounter (Signed)
Pt stated she would reschedule pt scheduled for 01/28/2020/ pt verbalized understanding.

## 2020-01-14 NOTE — Telephone Encounter (Signed)
In office per Dr. [name] if no way to check BP for virtual.

## 2020-01-14 NOTE — Telephone Encounter (Signed)
If she does not have a way to check a BP then no

## 2020-01-15 ENCOUNTER — Ambulatory Visit: Payer: Medicaid Other | Admitting: Family Medicine

## 2020-01-28 ENCOUNTER — Encounter: Payer: Self-pay | Admitting: Family Medicine

## 2020-01-28 ENCOUNTER — Telehealth (INDEPENDENT_AMBULATORY_CARE_PROVIDER_SITE_OTHER): Payer: Medicaid Other | Admitting: Family Medicine

## 2020-01-28 DIAGNOSIS — M4726 Other spondylosis with radiculopathy, lumbar region: Secondary | ICD-10-CM

## 2020-01-28 DIAGNOSIS — B029 Zoster without complications: Secondary | ICD-10-CM | POA: Diagnosis not present

## 2020-01-28 MED ORDER — VALACYCLOVIR HCL 1 G PO TABS: 1000.0000 mg | ORAL_TABLET | Freq: Two times a day (BID) | ORAL | 0 refills | Status: DC

## 2020-01-28 MED ORDER — PREDNISONE 10 MG PO TABS: ORAL_TABLET | ORAL | 0 refills | Status: DC

## 2020-01-28 NOTE — Progress Notes (Signed)
There were no vitals taken for this visit.   Subjective:    Patient ID: Cassandra Duarte, female    DOB: 08-04-1985, 35 y.o.   MRN: [id number]  HPI: Cassandra Duarte is a 35 y.o. female  Chief Complaint  Patient presents with  . leg neuropathy    Follow up patient states nothing has changed pain is the same  . Herpes Zoster    Patient states about 2 days ago she noticed her shingles flaring up on her neck, stomach, and leg   RASH Duration:  2 days  Location: neck  Itching: no Burning: yes Redness: yes Oozing: yes Scaling: yes Blisters: yes Painful: yes Fevers: no Change in detergents/soaps/personal care products: no Recent illness: no Recent travel:no History of same: yes Context: better Alleviating factors: nothing Treatments attempted:nothing Shortness of breath: no  Throat/tongue swelling: no Myalgias/arthralgias: no  Took the nortriplyline for about 2.5 weeks, but didn't feel like it helped with her radiculopathy, so she stopped it. The pain from her shingles has resolved and did prior to stopping her nortriptyline, so she has not noticed much of a difference. She notes that she has a very long term history of back pain. She has been having pain in her thighs down to her toes, R>L for months. She notes that this has been going on for years, but has been getting worse. She describes it as shooting pain. It radiates down her leg. Nothing makes it better and she is not sure what makes it worse. She has done numerous injections and treatments. She was following with [facility] back in 2019. At that time she had an MRI of her back and states that her surgeon told her that she needed surgery, which she hasn't done due to issues with caring for her child. Her pain had been stable, but then seemed to get worse again. She still does not think she can manage an elective surgery due to the recovery. She is otherwise doing OK.  Relevant past medical, surgical, family and  social history reviewed and updated as indicated. Interim medical history since our last visit reviewed. Allergies and medications reviewed and updated.  Review of Systems  Constitutional: Negative.   Respiratory: Negative.   Cardiovascular: Negative.   Gastrointestinal: Negative.   Musculoskeletal: Positive for back pain, gait problem and myalgias. Negative for arthralgias, joint swelling, neck pain and neck stiffness.  Skin: Positive for rash. Negative for color change, pallor and wound.  Psychiatric/Behavioral: Negative.     Per HPI unless specifically indicated above     Objective:    There were no vitals taken for this visit.  Wt Readings from Last 3 Encounters:  10/23/19 118 lb (53.5 kg)  09/27/19 117 lb (53.1 kg)  09/21/19 118 lb (53.5 kg)    Physical Exam  Results for orders placed or performed in visit on 04/05/19  WET PREP FOR [place], YEAST, CLUE  Result Value Ref Range   Trichomonas Exam Negative Negative   Yeast Exam Negative Negative   Clue Cell Exam Comment: Negative      Assessment & Plan:   Problem List Items Addressed This Visit      Nervous and Auditory   Osteoarthritis of spine with radiculopathy, lumbar region - Primary    Was told that she needed surgery in 2019. Advised her to follow back up with her neurosurgeon. Advised her to get her lumbar xray- order in. Will refer her to pain clinic to see if they can  help until she gets to have her surgery. Continue to monitor. Call with any concerns.       Relevant Medications   predniSONE (DELTASONE) 10 MG tablet   Other Relevant Orders   Ambulatory referral to Pain Clinic    Other Visit Diagnoses    Herpes zoster without complication       Will treat with prednisone and valtrex. Call if not getting better or getting worse. Continue to monitor.    Relevant Medications   valACYclovir (VALTREX) 1000 MG tablet       Follow up plan: Return if symptoms worsen or fail to improve.   . This visit was  completed via MyChart due to the restrictions of the COVID-19 pandemic. All issues as above were discussed and addressed. Physical exam was done as above through visual confirmation on MyChart. If it was felt that the patient should be evaluated in the office, they were directed there. The patient verbally consented to this visit. . Location of the patient: home . Location of the provider: home . Those involved with this call:  . Provider: Park Liter, DO . CMA: Louanna Raw, [place] . Front Desk/Registration: Jill Side  . Time spent on call: 25 minutes with patient face to face via video conference. More than 50% of this time was spent in counseling and coordination of care. 40 minutes total spent in review of patient's record and preparation of their chart.

## 2020-01-28 NOTE — Assessment & Plan Note (Signed)
Was told that she needed surgery in 2019. Advised her to follow back up with her neurosurgeon. Advised her to get her lumbar xray- order in. Will refer her to pain clinic to see if they can help until she gets to have her surgery. Continue to monitor. Call with any concerns.

## 2020-01-28 NOTE — Progress Notes (Signed)
Lvm pt will call back to make this apt

## 2020-03-09 ENCOUNTER — Other Ambulatory Visit: Payer: Self-pay | Admitting: Family Medicine

## 2020-03-10 ENCOUNTER — Telehealth: Payer: Self-pay

## 2020-03-10 NOTE — Telephone Encounter (Signed)
Routing to provider  Looks like you are her new provider

## 2020-03-10 NOTE — Telephone Encounter (Signed)
Patient is due for an in office visit.

## 2020-03-10 NOTE — Telephone Encounter (Signed)
Called pt advised must be in office. Pt verbalized understanding

## 2020-03-10 NOTE — Telephone Encounter (Signed)
Please schedule patient an appointment.  

## 2020-03-10 NOTE — Telephone Encounter (Signed)
Pt called in wanting to schedule an appt for medication refills. Pt states that she can get her vitals at home and is wanting to do a virtual visit because she has her child at home with her. Is this ok or does she need to come into the office. Please advise.

## 2020-03-10 NOTE — Telephone Encounter (Signed)
Pt scheduled for tomorrow

## 2020-03-10 NOTE — Progress Notes (Signed)
BP 106/72   Pulse 88   Temp 98.6 F (37 C)   Ht 5' 3"  (1.6 m)   Wt 122 lb (55.3 kg)   SpO2 99%   BMI 21.61 kg/m    Subjective:    Patient ID: Cassandra Duarte, female    DOB: 06/01/1985, 35 y.o.   MRN: [id number]  HPI: Cassandra Duarte is a 35 y.o. female  Chief Complaint  Patient presents with  . ADHD  . Gastroesophageal Reflux    Needs refill on omeprazole  . Labs Only    Vitamin D and Thyroid  . Back Pain   ADHD FOLLOW UP Well controlled on current medication regimen.  Patient uses Adderall 1m BID and 529mPRN for concentration.  [facility]  CHRONIC PAIN Patient is requesting referral to pain management.  She is not able to have surgery at this time due to having to care for her son who had a brian injury.  Once patient's son is in a better place patient plans to get surgery.   Relevant past medical, surgical, family and social history reviewed and updated as indicated. Interim medical history since our last visit reviewed. Allergies and medications reviewed and updated.  Review of Systems  Musculoskeletal: Positive for back pain.       Leg pain  Psychiatric/Behavioral: Positive for decreased concentration.    Per HPI unless specifically indicated above     Objective:    BP 106/72   Pulse 88   Temp 98.6 F (37 C)   Ht 5' 3"  (1.6 m)   Wt 122 lb (55.3 kg)   SpO2 99%   BMI 21.61 kg/m   Wt Readings from Last 3 Encounters:  03/11/20 122 lb (55.3 kg)  10/23/19 118 lb (53.5 kg)  09/27/19 117 lb (53.1 kg)    Physical Exam Vitals and nursing note reviewed.  Constitutional:      General: She is not in acute distress.    Appearance: Normal appearance. She is normal weight. She is not ill-appearing, toxic-appearing or diaphoretic.  HENT:     Head: Normocephalic.     Right Ear: External ear normal.     Left Ear: External ear normal.     Nose: Nose normal.     Mouth/Throat:     Mouth: Mucous membranes  are moist.     Pharynx: Oropharynx is clear.  Eyes:     General:        Right eye: No discharge.        Left eye: No discharge.     Extraocular Movements: Extraocular movements intact.     Conjunctiva/sclera: Conjunctivae normal.     Pupils: Pupils are equal, round, and reactive to light.  Cardiovascular:     Rate and Rhythm: Normal rate and regular rhythm.     Heart sounds: No murmur heard.   Pulmonary:     Effort: Pulmonary effort is normal. No respiratory distress.     Breath sounds: Normal breath sounds. No wheezing or rales.  Musculoskeletal:     Cervical back: Normal range of motion and neck supple.  Skin:    General: Skin is warm and dry.     Capillary Refill: Capillary refill takes less than 2 seconds.  Neurological:     General: No focal deficit present.     Mental Status: She is alert and oriented to person, place, and time. Mental status is at baseline.  Psychiatric:  Mood and Affect: Mood normal.        Behavior: Behavior normal.        Thought Content: Thought content normal.        Judgment: Judgment normal.     Results for orders placed or performed in visit on 04/05/19  WET PREP FOR [place], YEAST, CLUE  Result Value Ref Range   Trichomonas Exam Negative Negative   Yeast Exam Negative Negative   Clue Cell Exam Comment: Negative      Assessment & Plan:   Problem List Items Addressed This Visit      Nervous and Auditory   Chronic low back pain with right-sided sciatica    Ongoing.  Referral to pain management and Chiropractor.        Relevant Medications   amphetamine-dextroamphetamine (ADDERALL) 5 MG tablet   Other Relevant Orders   Ambulatory referral to Pain Clinic   Ambulatory referral to Chiropractic     Other   ADD (attention deficit disorder) - Primary    Under good control on current regimen. Continue current regimen. Continue to monitor. Controlled substance agreement and UDS obtained in office today.  Patient is out of Adderrall  32m.  Medications are not on the same schedule, will provide 9 pills of Adderall 538mto get patient to next refill of the 2018m Then medications will be on the same schedule and filled at the same time.  PMP checked.        Relevant Orders   [id number]+Oxyco+Alc+Crt-Bund   Comp Met (CMET)   Controlled substance agreement signed    Signed on 03/11/20.  UDS obtained.       Vitamin D deficiency    Ongoing.  Patient is taking Vit D OTC supplement.  Labs ordered today.  Will make recommendations based on lab results.       Relevant Orders   Vitamin D (25 hydroxy)       Follow up plan: Return in about 3 months (around 06/08/2020) for Physical and Fasting labs and PAP.

## 2020-03-11 ENCOUNTER — Ambulatory Visit (INDEPENDENT_AMBULATORY_CARE_PROVIDER_SITE_OTHER): Payer: Medicaid Other | Admitting: Nurse Practitioner

## 2020-03-11 ENCOUNTER — Other Ambulatory Visit: Payer: Self-pay

## 2020-03-11 ENCOUNTER — Encounter: Payer: Self-pay | Admitting: Nurse Practitioner

## 2020-03-11 VITALS — BP 106/72 | HR 88 | Temp 98.6°F | Ht 63.0 in | Wt 122.0 lb

## 2020-03-11 DIAGNOSIS — F988 Other specified behavioral and emotional disorders with onset usually occurring in childhood and adolescence: Secondary | ICD-10-CM

## 2020-03-11 DIAGNOSIS — G8929 Other chronic pain: Secondary | ICD-10-CM | POA: Diagnosis not present

## 2020-03-11 DIAGNOSIS — Z79899 Other long term (current) drug therapy: Secondary | ICD-10-CM

## 2020-03-11 DIAGNOSIS — M5441 Lumbago with sciatica, right side: Secondary | ICD-10-CM

## 2020-03-11 DIAGNOSIS — E559 Vitamin D deficiency, unspecified: Secondary | ICD-10-CM

## 2020-03-11 MED ORDER — AMPHETAMINE-DEXTROAMPHETAMINE 5 MG PO TABS: 5.0000 mg | ORAL_TABLET | Freq: Every day | ORAL | 0 refills | Status: DC

## 2020-03-11 NOTE — Assessment & Plan Note (Signed)
Signed on 03/11/20.  UDS obtained.

## 2020-03-11 NOTE — Assessment & Plan Note (Signed)
Ongoing.  Patient is taking Vit D OTC supplement.  Labs ordered today.  Will make recommendations based on lab results.

## 2020-03-11 NOTE — Assessment & Plan Note (Signed)
Ongoing.  Referral to pain management and Chiropractor.

## 2020-03-11 NOTE — Assessment & Plan Note (Signed)
Under good control on current regimen. Continue current regimen. Continue to monitor. Controlled substance agreement and UDS obtained in office today.  Patient is out of Adderrall 5mg .  Medications are not on the same schedule, will provide 9 pills of Adderall 5mg  to get patient to next refill of the 20mg .  Then medications will be on the same schedule and filled at the same time.  PMP checked.

## 2020-03-12 LAB — COMPREHENSIVE METABOLIC PANEL
ALT: 13 IU/L (ref 0–32)
AST: 16 IU/L (ref 0–40)
Albumin/Globulin Ratio: 2.2 (ref 1.2–2.2)
Albumin: 4.2 g/dL (ref 3.8–4.8)
Alkaline Phosphatase: 69 IU/L (ref 44–121)
BUN/Creatinine Ratio: 20 (ref 9–23)
BUN: 12 mg/dL (ref 6–20)
Bilirubin Total: <0.2 mg/dL (ref 0.0–1.2)
CO2: 19 mmol/L — ABNORMAL LOW (ref 20–29)
Calcium: 8.6 mg/dL — ABNORMAL LOW (ref 8.7–10.2)
Chloride: 106 mmol/L (ref 96–106)
Creatinine, Ser: 0.61 mg/dL (ref 0.57–1.00)
GFR calc Af Amer: 137 mL/min/{1.73_m2} (ref >59)
GFR calc non Af Amer: 119 mL/min/{1.73_m2} (ref >59)
Globulin, Total: 1.9 g/dL (ref 1.5–4.5)
Glucose: 92 mg/dL (ref 65–99)
Potassium: 4.4 mmol/L (ref 3.5–5.2)
Sodium: 140 mmol/L (ref 134–144)
Total Protein: 6.1 g/dL (ref 6.0–8.5)

## 2020-03-12 LAB — VITAMIN D 25 HYDROXY (VIT D DEFICIENCY, FRACTURES): Vit D, 25-Hydroxy: 31 ng/mL (ref 30.0–100.0)

## 2020-03-12 NOTE — Progress Notes (Signed)
Hi Cassandra Duarte.  Your lab work looks good.  Vitamin D is within normal range.  Continue with the over the counter supplement.  I will see you at our next visit.

## 2020-03-17 ENCOUNTER — Other Ambulatory Visit: Payer: Self-pay | Admitting: Nurse Practitioner

## 2020-03-17 MED ORDER — AMPHETAMINE-DEXTROAMPHETAMINE 20 MG PO TABS: 20.0000 mg | ORAL_TABLET | Freq: Two times a day (BID) | ORAL | 0 refills | Status: DC

## 2020-03-17 MED ORDER — AMPHETAMINE-DEXTROAMPHETAMINE 5 MG PO TABS: 5.0000 mg | ORAL_TABLET | Freq: Every day | ORAL | 0 refills | Status: DC

## 2020-03-17 NOTE — Telephone Encounter (Signed)
Medications sent to the pharmacy.  PMP checked.

## 2020-03-17 NOTE — Telephone Encounter (Signed)
Requested medication (s) are due for refill today: Adderall 20 mg and Adderrall 5 mg, yes  Requested medication (s) are on the active medication list: yes  Last refill:  ?  Future visit scheduled: No  Notes to clinic:  not delegated    Requested Prescriptions  Pending Prescriptions Disp Refills   amphetamine-dextroamphetamine (ADDERALL) 5 MG tablet 30 tablet 0    Sig: Take 1 tablet (5 mg total) by mouth daily.      Not Delegated - Psychiatry:  Stimulants/ADHD Failed - 03/17/2020  1:54 PM      Failed - This refill cannot be delegated      Failed - Urine Drug Screen completed in last 360 days      Passed - Valid encounter within last 3 months    Recent Outpatient Visits           6 days ago Attention deficit disorder (ADD) without hyperactivity   [place], NP   1 month ago Osteoarthritis of spine with radiculopathy, lumbar region   [facility] [place], [place], DO   3 months ago Postherpetic neuralgia   [place], [place], DO   3 months ago Rash and nonspecific skin eruption   [facility] Eulogio Bear, NP   5 months ago Acute low back pain   [place], [place], DO       Future Appointments             In 3 months Jon Billings, NP [place], [facility]               amphetamine-dextroamphetamine (ADDERALL) 20 MG tablet 60 tablet 0    Sig: Take 1 tablet (20 mg total) by mouth 2 (two) times daily.      Not Delegated - Psychiatry:  Stimulants/ADHD Failed - 03/17/2020  1:54 PM      Failed - This refill cannot be delegated      Failed - Urine Drug Screen completed in last 360 days      Passed - Valid encounter within last 3 months    Recent Outpatient Visits           6 days ago Attention deficit disorder (ADD) without hyperactivity   [place], NP   1 month ago Osteoarthritis of spine with radiculopathy,  lumbar region   [facility], [place], DO   3 months ago Postherpetic neuralgia   [place], [place], DO   3 months ago Rash and nonspecific skin eruption   [facility] Eulogio Bear, NP   5 months ago Acute low back pain   [facility] Valerie Roys, DO       Future Appointments             In 3 months Jon Billings, NP [facility], [place], [place]

## 2020-03-17 NOTE — Telephone Encounter (Signed)
Copied from [place] [id number]. Topic: Quick Communication - Rx Refill/Question >> Mar 17, 2020  1:47 PM Leward Quan A wrote: Medication: amphetamine-dextroamphetamine (ADDERALL) 5 MG tablet, amphetamine-dextroamphetamine (ADDERALL) 20 MG tablet  Has the patient contacted their pharmacy? Yes.   (Agent: If no, request that the patient contact the pharmacy for the refill.) (Agent: If yes, when and what did the pharmacy advise?)  Preferred Pharmacy (with phone number or street name): [id number] - [place], [place]  Phone:  [phone] Fax:  [phone]     Agent: Please be advised that RX refills may take up to 3 business days. We ask that you follow-up with your pharmacy.

## 2020-03-17 NOTE — Telephone Encounter (Signed)
Requested medication (s) are due for refill today: Adderall 20 mg, yes  Requested medication (s) are on the active medication list: yes  Last refill: ?  Future visit scheduled: yes  Notes to clinic:  not delegated  .Requested medication (s) are due for refill today: Adderall 5 mg ,yes   Requested medication (s) are on the active medication list: yes  Last refill:  ?  Future visit scheduled: yes  Notes to clinic:  not delegated        Requested Prescriptions  Pending Prescriptions Disp Refills   amphetamine-dextroamphetamine (ADDERALL) 5 MG tablet 30 tablet 0    Sig: Take 1 tablet (5 mg total) by mouth daily.      Not Delegated - Psychiatry:  Stimulants/ADHD Failed - 03/17/2020  1:54 PM      Failed - This refill cannot be delegated      Failed - Urine Drug Screen completed in last 360 days      Passed - Valid encounter within last 3 months    Recent Outpatient Visits           6 days ago Attention deficit disorder (ADD) without hyperactivity   [place], NP   1 month ago Osteoarthritis of spine with radiculopathy, lumbar region   [facility], [place], DO   3 months ago Postherpetic neuralgia   [place], [place], DO   3 months ago Rash and nonspecific skin eruption   [facility] Eulogio Bear, NP   5 months ago Acute low back pain   [place], [place], DO       Future Appointments             In 3 months Jon Billings, NP [place], [facility]               amphetamine-dextroamphetamine (ADDERALL) 20 MG tablet 60 tablet 0    Sig: Take 1 tablet (20 mg total) by mouth 2 (two) times daily.      Not Delegated - Psychiatry:  Stimulants/ADHD Failed - 03/17/2020  1:54 PM      Failed - This refill cannot be delegated      Failed - Urine Drug Screen completed in last 360 days      Passed - Valid encounter within last 3 months     Recent Outpatient Visits           6 days ago Attention deficit disorder (ADD) without hyperactivity   [place], NP   1 month ago Osteoarthritis of spine with radiculopathy, lumbar region   [facility], [place], DO   3 months ago Postherpetic neuralgia   [place], [place], DO   3 months ago Rash and nonspecific skin eruption   [facility] Eulogio Bear, NP   5 months ago Acute low back pain   [facility] Valerie Roys, DO       Future Appointments             In 3 months Jon Billings, NP [facility], [place]

## 2020-03-18 ENCOUNTER — Telehealth: Payer: Self-pay

## 2020-03-18 MED ORDER — AMPHETAMINE-DEXTROAMPHETAMINE 20 MG PO TABS: 20.0000 mg | ORAL_TABLET | Freq: Two times a day (BID) | ORAL | 0 refills | Status: DC

## 2020-03-18 MED ORDER — AMPHETAMINE-DEXTROAMPHETAMINE 5 MG PO TABS: 5.0000 mg | ORAL_TABLET | Freq: Every day | ORAL | 0 refills | Status: DC

## 2020-03-18 NOTE — Telephone Encounter (Signed)
Copied from [place] [id number]. Topic: General - Other >> Mar 17, 2020  4:55 PM Pawl[place], Brayton Layman A wrote: Reason for CRM: Pt wanted to know if she could have her ADDERALL filled a day early, needs doctor approval for the pharmacy to go ahead with it.  Pt also stated Jon Billings is not listed on her MyChart to send direct messages. Please advise.

## 2020-03-18 NOTE — Telephone Encounter (Signed)
Please cancel prescriptions for both Adderall 20 mg and 5mg  to start on 3/3.  I have resent the medication to begin on 3/2.  Please let the patient know that I have changed this.

## 2020-03-18 NOTE — Telephone Encounter (Signed)
Copied from [place] [id number]. Topic: General - Other >> Mar 18, 2020 10:10 AM Leward Quan A wrote: Reason for CRM: Patient called in to inquire of her PCP if she can call the pharmacy and release the Rx for her amphetamine-dextroamphetamine (ADDERALL) 5 MG tablet. Per patient she have a test in the morning at 8.30 AM  and need these medication today. Please call patient at Ph# [phone]

## 2020-03-18 NOTE — Telephone Encounter (Signed)
Medication resent to the pharmacy.

## 2020-03-18 NOTE — Telephone Encounter (Signed)
Prescriptions cancelled.

## 2020-03-18 NOTE — Telephone Encounter (Signed)
Patient also states that she has a test tomorrow morning, so she would need to pick these prescriptions up today so that she will have them for her test at 8:30am.

## 2020-03-18 NOTE — Telephone Encounter (Signed)
Called and spoke with patient, the medication was sent to [facility], these prescriptions need to be sent to [place].

## 2020-03-28 LAB — OPIATES CONFIRMATION, URINE
Codeine: NEGATIVE
Hydrocodone Confirm: 727 ng/mL
Hydrocodone: POSITIVE — AB
Hydromorphone: NEGATIVE
Morphine: NEGATIVE
Opiates: POSITIVE ng/mL — AB

## 2020-03-28 LAB — DRUG SCREEN 764883 11+OXYCO+ALC+CRT-BUND
Amphetamines, Urine: NEGATIVE ng/mL
BENZODIAZ UR QL: NEGATIVE ng/mL
Barbiturate: NEGATIVE ng/mL
Cannabinoid Quant, Ur: NEGATIVE ng/mL
Cocaine (Metabolite): NEGATIVE ng/mL
Creatinine: 82.4 mg/dL (ref 20.0–300.0)
Ethanol: NEGATIVE %
Meperidine: NEGATIVE ng/mL
Methadone Screen, Urine: NEGATIVE ng/mL
Oxycodone/Oxymorphone, Urine: NEGATIVE ng/mL
Phencyclidine: NEGATIVE ng/mL
Propoxyphene: NEGATIVE ng/mL
Tramadol: NEGATIVE ng/mL
pH, Urine: 5.6 (ref 4.5–8.9)

## 2020-03-28 NOTE — Progress Notes (Signed)
Hi Cassandra Duarte.  I received your urine drug screen from your appointment.  As you can see it was positive for an Opiate, specifically Hydrocodone, and violates the controlled substance agreement that we signed.  Unfortunately, this does mean that you will no longer be able to receive your Adderrall or any other controlled substance from our practice.  If you would like to discuss this further we can set up a telehealth visit or you can come into the office.

## 2020-04-03 ENCOUNTER — Telehealth: Payer: Self-pay

## 2020-04-03 DIAGNOSIS — F988 Other specified behavioral and emotional disorders with onset usually occurring in childhood and adolescence: Secondary | ICD-10-CM

## 2020-04-03 NOTE — Telephone Encounter (Signed)
Copied from [place] [phone]. Topic: General - Inquiry >> Apr 03, 2020  9:05 AM Scherrie Gerlach wrote: Reason for CRM: pt states her drug screen came back positive for opiates (hydrocodone).  She states she told Santiago Glad she took pain medicine the day before.  Pt states she has had broken tailbone X2 and has broke vertebrae in her back.  It is in her record she has a script for hydrocodone.  Pt feels like Santiago Glad did not listen to her. Pt does not even want to see karen again, she like would to switch drs. Pt would like to know what she can do, because she has been on adderall since 2019 and cannot just come off adderall.

## 2020-04-09 NOTE — Telephone Encounter (Signed)
Copied from [place] [phone]. Topic: General - Other >> Apr 09, 2020  2:43 PM Tessa Lerner A wrote: Reason for CRM: Patient would like to be contacted by Doug Sou when possible Patient would like to discuss medication related concerns Please contact to advise further

## 2020-04-14 ENCOUNTER — Telehealth: Payer: Self-pay | Admitting: Nurse Practitioner

## 2020-04-14 DIAGNOSIS — S40011A Contusion of right shoulder, initial encounter: Secondary | ICD-10-CM | POA: Diagnosis not present

## 2020-04-14 DIAGNOSIS — S299XXA Unspecified injury of thorax, initial encounter: Secondary | ICD-10-CM

## 2020-04-14 NOTE — Telephone Encounter (Signed)
Patient is calling to speak to [place] regarding hydrocodone please advise Pt has concerns about Santiago Glad. [phone]

## 2020-04-14 NOTE — Telephone Encounter (Signed)
Patient checking on the status of message below best # [phone]. Patient states she does not want to come off the medication abruptly because she will experience migraines

## 2020-04-14 NOTE — Telephone Encounter (Signed)
Message sent to patient via mychart

## 2020-04-14 NOTE — Telephone Encounter (Signed)
Spoke with patient regarding medication concerns and visit with Jon Billings, NP.  Patient states she was positive for drug test because she took old medication, she was prescribed by ortho. Patient states that she understands that she broke her contract but is concerned that she will develop migraines if unable to obtain a refill of Adderall.  Advised patient that provider placed a referral for psychiatry to manage Adderall prescriptions. Patient agreed to go to psychiatry but requested that provider refill her prescription until she can see psychiatry. Sending to provider for approval.

## 2020-04-16 MED ORDER — AMPHETAMINE-DEXTROAMPHETAMINE 5 MG PO TABS: 5.0000 mg | ORAL_TABLET | Freq: Every day | ORAL | 0 refills | Status: DC

## 2020-04-16 MED ORDER — AMPHETAMINE-DEXTROAMPHETAMINE 20 MG PO TABS: 20.0000 mg | ORAL_TABLET | Freq: Two times a day (BID) | ORAL | 0 refills | Status: DC

## 2020-04-16 NOTE — Progress Notes (Deleted)
No-show to initial evaluation April 21, 2020.

## 2020-04-16 NOTE — Telephone Encounter (Signed)
Spoke with patient on the phone and discussed the referral to psychiatry as well as letting her know that I will fill her medication until she is able to get into see them.  Discussed that she is able to be seen here as a patient still.  Controlled substances should be obtained from Pain Management and Psychiatry. She will not be able to receive them from this office.   Refills sent today since referral has just been placed. Patient will have to show that she has an appointment set up with [facility] to receive another refill.  Patient states that she fell in the bathtub on Saturday and bruised her ribs.  She was seen at [facility] to have her shoulder looked at (it was also injured in the fall).  Patient was told to see PCP if ribs were still bothering her.  Discussed that I would order a xray of her ribs for evaluation.

## 2020-04-21 ENCOUNTER — Other Ambulatory Visit: Payer: Self-pay | Admitting: Pain Medicine

## 2020-04-21 ENCOUNTER — Ambulatory Visit: Payer: Medicaid Other | Admitting: Pain Medicine

## 2020-04-21 ENCOUNTER — Other Ambulatory Visit: Payer: Self-pay

## 2020-04-21 ENCOUNTER — Ambulatory Visit (INDEPENDENT_AMBULATORY_CARE_PROVIDER_SITE_OTHER): Payer: Medicaid Other | Admitting: Nurse Practitioner

## 2020-04-21 ENCOUNTER — Encounter: Payer: Self-pay | Admitting: Nurse Practitioner

## 2020-04-21 ENCOUNTER — Ambulatory Visit: Payer: Self-pay

## 2020-04-21 DIAGNOSIS — M4300 Spondylolysis, site unspecified: Secondary | ICD-10-CM | POA: Insufficient documentation

## 2020-04-21 DIAGNOSIS — U071 COVID-19: Secondary | ICD-10-CM

## 2020-04-21 DIAGNOSIS — Z79899 Other long term (current) drug therapy: Secondary | ICD-10-CM | POA: Insufficient documentation

## 2020-04-21 DIAGNOSIS — Z789 Other specified health status: Secondary | ICD-10-CM | POA: Insufficient documentation

## 2020-04-21 DIAGNOSIS — Z5329 Procedure and treatment not carried out because of patient's decision for other reasons: Secondary | ICD-10-CM

## 2020-04-21 DIAGNOSIS — M4310 Spondylolisthesis, site unspecified: Secondary | ICD-10-CM | POA: Insufficient documentation

## 2020-04-21 DIAGNOSIS — M4317 Spondylolisthesis, lumbosacral region: Secondary | ICD-10-CM | POA: Insufficient documentation

## 2020-04-21 DIAGNOSIS — M899 Disorder of bone, unspecified: Secondary | ICD-10-CM | POA: Insufficient documentation

## 2020-04-21 DIAGNOSIS — Z91199 Patient's noncompliance with other medical treatment and regimen due to unspecified reason: Secondary | ICD-10-CM

## 2020-04-21 MED ORDER — ONDANSETRON 8 MG PO TBDP: 8.0000 mg | ORAL_TABLET | Freq: Three times a day (TID) | ORAL | 0 refills | Status: DC | PRN

## 2020-04-21 MED ORDER — METHOCARBAMOL 500 MG PO TABS: 500.0000 mg | ORAL_TABLET | Freq: Two times a day (BID) | ORAL | 0 refills | Status: DC

## 2020-04-21 NOTE — Telephone Encounter (Signed)
Pt. Reports she has tested positive for COVID 19 with home test.Has mild cough,nausea,vomting and diarrhea. States her children have been sick as well. Virtual visit made for today.  Answer Assessment - Initial Assessment Questions 1. COVID-19 DIAGNOSIS: "Who made your COVID-19 diagnosis?" "Was it confirmed by a positive lab test or self-test?" If not diagnosed by a doctor (or NP/PA), ask "Are there lots of cases (community spread) where you live?" Note: See public health department website, if unsure.     Home test 2. COVID-19 EXPOSURE: "Was there any known exposure to COVID before the symptoms began?" CDC Definition of close contact: within 6 feet (2 meters) for a total of 15 minutes or more over a 24-hour period.      Yes 3. ONSET: "When did the COVID-19 symptoms start?"      04/16/20 4. WORST SYMPTOM: "What is your worst symptom?" (e.g., cough, fever, shortness of breath, muscle aches)     Vomiting, diarrhea 5. COUGH: "Do you have a cough?" If Yes, ask: "How bad is the cough?"       Mild 6. FEVER: "Do you have a fever?" If Yes, ask: "What is your temperature, how was it measured, and when did it start?"     Fever Saturday 7. RESPIRATORY STATUS: "Describe your breathing?" (e.g., shortness of breath, wheezing, unable to speak)      No 8. BETTER-SAME-WORSE: "Are you getting better, staying the same or getting worse compared to yesterday?"  If getting worse, ask, "In what way?"     Same 9. HIGH RISK DISEASE: "Do you have any chronic medical problems?" (e.g., asthma, heart or lung disease, weak immune system, obesity, etc.)     No 10. VACCINE: "Have you had the COVID-19 vaccine?" If Yes, ask: "Which one, how many shots, when did you get it?"       No 11. BOOSTER: "Have you received your COVID-19 booster?" If Yes, ask: "Which one and when did you get it?"       No 12. PREGNANCY: "Is there any chance you are pregnant?" "When was your last menstrual period?"       No 13. OTHER SYMPTOMS: "Do you  have any other symptoms?"  (e.g., chills, fatigue, headache, loss of smell or taste, muscle pain, sore throat)       Fatigue 14. O2 SATURATION MONITOR:  "Do you use an oxygen saturation monitor (pulse oximeter) at home?" If Yes, ask "What is your reading (oxygen level) today?" "What is your usual oxygen saturation reading?" (e.g., 95%)       No  Protocols used: CORONAVIRUS (COVID-19) DIAGNOSED OR SUSPECTED-A-AH

## 2020-04-21 NOTE — Progress Notes (Signed)
There were no vitals taken for this visit.   Subjective:    Patient ID: Cassandra Duarte, female    DOB: 06-Feb-1985, 35 y.o.   MRN: [id number]  HPI: Cassandra Duarte is a 35 y.o. female  Chief Complaint  Patient presents with  . Covid Positive    Tested positive on Saturday, was congested last Thursday Friday now she has mainly GI symtoms    COVID POSITIVE Patient states that she tested positive for COVID on Saturday.  She has been congested since last Thursday.  Patient has been throwing up since yesterday.  She tried to eat some toast today but it came back up. Patient is able to keep down Gatorade and water.  Patient states she has been running a fever around 101.  Patient is coughing, headaches and congested.  Denies SOB, wheezing, chest pain.   Relevant past medical, surgical, family and social history reviewed and updated as indicated. Interim medical history since our last visit reviewed. Allergies and medications reviewed and updated.  Review of Systems  Constitutional: Positive for fever.  HENT: Positive for congestion.   Respiratory: Positive for cough and wheezing. Negative for shortness of breath.   Cardiovascular: Negative for chest pain.  Gastrointestinal: Positive for abdominal pain and vomiting.  Neurological: Positive for headaches.    Per HPI unless specifically indicated above     Objective:    There were no vitals taken for this visit.  Wt Readings from Last 3 Encounters:  03/11/20 122 lb (55.3 kg)  10/23/19 118 lb (53.5 kg)  09/27/19 117 lb (53.1 kg)    Physical Exam Vitals and nursing note reviewed.  HENT:     Head: Normocephalic.     Right Ear: Hearing normal.     Left Ear: Hearing normal.     Nose: Nose normal.  Eyes:     Pupils: Pupils are equal, round, and reactive to light.  Pulmonary:     Effort: Pulmonary effort is normal. No respiratory distress.  Neurological:     Mental Status: She is alert.  Psychiatric:        Mood  and Affect: Mood normal.        Behavior: Behavior normal.        Thought Content: Thought content normal.        Judgment: Judgment normal.     Results for orders placed or performed in visit on 03/11/20  [id number] 11+Oxyco+Alc+Crt-Bund  Result Value Ref Range   Ethanol Negative Cutoff=0.020 %   Amphetamines, Urine Negative Cutoff=1000 ng/mL   Barbiturate Negative Cutoff=200 ng/mL   BENZODIAZ UR QL Negative Cutoff=200 ng/mL   Cannabinoid Quant, Ur Negative Cutoff=50 ng/mL   Cocaine (Metabolite) Negative Cutoff=300 ng/mL   OPIATE SCREEN URINE See Final Results Cutoff=300 ng/mL   Oxycodone/Oxymorphone, Urine Negative Cutoff=300 ng/mL   Phencyclidine Negative Cutoff=25 ng/mL   Methadone Screen, Urine Negative Cutoff=300 ng/mL   Propoxyphene Negative Cutoff=300 ng/mL   Meperidine Negative Cutoff=200 ng/mL   Tramadol Negative Cutoff=200 ng/mL   Creatinine 82.4 20.0 - 300.0 mg/dL   pH, Urine 5.6 4.5 - 8.9  Comp Met (CMET)  Result Value Ref Range   Glucose 92 65 - 99 mg/dL   BUN 12 6 - 20 mg/dL   Creatinine, Ser 0.61 0.57 - 1.00 mg/dL   GFR calc non Af Amer 119 >59 mL/min/1.73   GFR calc Af Amer 137 >59 mL/min/1.73   BUN/Creatinine Ratio 20 9 - 23   Sodium 140 134 - 144 mmol/L  Potassium 4.4 3.5 - 5.2 mmol/L   Chloride 106 96 - 106 mmol/L   CO2 19 (L) 20 - 29 mmol/L   Calcium 8.6 (L) 8.7 - 10.2 mg/dL   Total Protein 6.1 6.0 - 8.5 g/dL   Albumin 4.2 3.8 - 4.8 g/dL   Globulin, Total 1.9 1.5 - 4.5 g/dL   Albumin/Globulin Ratio 2.2 1.2 - 2.2   Bilirubin Total <0.2 0.0 - 1.2 mg/dL   Alkaline Phosphatase 69 44 - 121 IU/L   AST 16 0 - 40 IU/L   ALT 13 0 - 32 IU/L  Vitamin D (25 hydroxy)  Result Value Ref Range   Vit D, 25-Hydroxy 31.0 30.0 - 100.0 ng/mL  Opiates Confirmation, Urine  Result Value Ref Range   Opiates Positive (A) Cutoff=300 ng/mL     Codeine Negative Cutoff=300     Morphine Negative Cutoff=300     Hydromorphone Negative Cutoff=300     Hydrocodone Positive (A)        Hydrocodone Confirm 727 Cutoff=300 ng/mL      Assessment & Plan:   Problem List Items Addressed This Visit   None   Visit Diagnoses    COVID-19 virus RNA test result positive at limit of detection    -  Primary   Continue with symptom management. Make sure to stay well hydrated. ER precautions discussed. Recommend quarantine until symptoms resolve.        Follow up plan: Return if symptoms worsen or fail to improve.      This visit was completed via MyChart due to the restrictions of the COVID-19 pandemic. All issues as above were discussed and addressed. Physical exam was done as above through visual confirmation on MyChart. If it was felt that the patient should be evaluated in the office, they were directed there. The patient verbally consented to this visit. 1. Location of the patient: Home 2. Location of the provider: Office 3. Those involved with this call:  ? Provider: Jon Billings, NP ? CMA: Tiffany Reel, CMA ? Front Desk/Registration: [facility] Side 4. Time spent on call: 15 minutes with patient face to face via video conference. More than 50% of this time was spent in counseling and coordination of care. 20 minutes total spent in review of patient's record and preparation of their chart.

## 2020-04-21 NOTE — Progress Notes (Addendum)
Patient: Cassandra Duarte  Service Category: E/M  Provider: Gaspar Cola, MD  DOB: 06-Dec-1985  DOS: 04/21/2020  Referring Provider: Valerie Roys DO  MRN: [id number]  Setting: Ambulatory outpatient  PCP: Jon Billings, NP  Type: New Patient  Specialty: Interventional Pain Management     NOTE: Precharting assessment 04/21/2020.  No-show to initial evaluation on 04/21/2020.  Patient had been previously evaluated here, at [facility], by Dr. Gillis Santa on 05/10/2017.    The evaluation reads:  "35 year old female who presents with a chief complaint of axial low back pain that radiates into bilateral legs.  Patient describes numbness and tingling in her legs as well.  She denies any bowel bladder dysfunction.  She states that she has been dealing with this for many years.  She states that her pain got worse after labor epidural 2 years ago.  She states that stretching helps with her pain.  She is tried physical therapy and chiropractic therapy in the past.  She states that she takes Percocet at night to help with sleep which she does not find very effective.  She is also being worked up for possible endometriosis."  Historic Controlled Substance Pharmacotherapy Review  PMP and historical list of controlled substances: Dextroamphetamine-amphetamine 5 mg tablet, 1 daily; dextroamphetamine-amphetamine 20 mg tablet 1 twice daily; oxycodone/APAP 5/325 1 4 times daily; hydrocodone/APAP 5/325 1 3 times daily; oxycodone IR 5 mg 1 daily Current opioid analgesics:  Oxycodone/APAP 5/325 #12, 1 tab p.o. 4 times daily (last filled 04/15/2020) MME/day: 30 mg/day    Meds   Current Outpatient Medications:  .  amphetamine-dextroamphetamine (ADDERALL) 20 MG tablet, Take 1 tablet (20 mg total) by mouth 2 (two) times daily., Disp: 60 tablet, Rfl: 0 .  amphetamine-dextroamphetamine (ADDERALL) 5 MG tablet, Take 1 tablet (5 mg total) by mouth daily., Disp: 30 tablet, Rfl: 0 .  cholecalciferol  (VITAMIN D3) 25 MCG (1000 UNIT) tablet, Take 1,000 Units by mouth daily., Disp: , Rfl:  .  ibuprofen (ADVIL) 800 MG tablet, Take 1 tablet (800 mg total) by mouth every 8 (eight) hours as needed for mild pain or moderate pain., Disp: 30 tablet, Rfl: 0 .  methocarbamol (ROBAXIN) 500 MG tablet, Take 1 tablet (500 mg total) by mouth 2 (two) times daily. (Patient not taking: No sig reported), Disp: 20 tablet, Rfl: 0 .  Multiple Vitamin (MULTIVITAMIN PO), Take 1 tablet by mouth daily. , Disp: , Rfl:  .  valACYclovir (VALTREX) 1000 MG tablet, Take 1 tablet (1,000 mg total) by mouth 2 (two) times daily., Disp: 20 tablet, Rfl: 0 .  Vitamin D, Ergocalciferol, (DRISDOL) 1.25 MG (50000 UNIT) CAPS capsule, Take 1 capsule (50,000 Units total) by mouth every 7 (seven) days. (Patient not taking: No sig reported), Disp: 12 capsule, Rfl: 3  Imaging Review  Shoulder Imaging: Shoulder-L DG: Results for orders placed during the hospital encounter of 06/02/18 DG Shoulder Left  Narrative CLINICAL DATA:  Left shoulder pain after injury. Felt a pop after lifting her son.  EXAM: LEFT SHOULDER - 2+ VIEW  COMPARISON:  None.  FINDINGS: There is no evidence of fracture or dislocation. There is no evidence of arthropathy or other focal bone abnormality. Soft tissues are unremarkable.  IMPRESSION: Negative radiographs of the left shoulder.   Electronically Signed By: Keith Rake M.D. On: 06/02/2018 01:28  Lumbosacral Imaging: Lumbar MR wo contrast: Results for orders placed during the hospital encounter of 02/28/17 MR LUMBAR SPINE WO CONTRAST  Narrative CLINICAL DATA:  Acute right-sided low back pain with right-sided sciatica.  EXAM: MRI LUMBAR SPINE WITHOUT CONTRAST  TECHNIQUE: Multiplanar, multisequence MR imaging of the lumbar spine was performed. No intravenous contrast was administered.  COMPARISON:  CT scan of the abdomen and pelvis dated 02/28/2017 and lumbar radiographs dated  11/29/2009 and chest x-ray dated 07/27/2010  FINDINGS: Segmentation: Congenital sacralization of the L5 vertebra. Only 4 typical lumbar segments. Twelve rib-bearing vertebra.  Alignment:  Physiologic.  Vertebrae: Complete sacralization of L5 with a vestigial disc at L5-S1. Bilateral pars defects at L4.  Conus medullaris and cauda equina: Conus extends to the T12-L1 level. Conus and cauda equina appear normal.  Paraspinal and other soft tissues: Negative.  Disc levels:  T11-12 through L3-4: Normal.  L4-5: Small broad-based soft disc protrusion with no neural impingement. Central annular fissure. Bilateral pars defects without spondylolisthesis.  L5-S1: Vestigial disc. L5 is congenitally completely fused with the sacrum.  IMPRESSION: 1. 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 Focal degenerative changes associated with the pars defects without neural impingement. 2. 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   Electronically Signed By: Lorriane Shire M.D. On: 02/28/2017 17:04  Lumbar DG 2-3 views: Results for orders placed during the hospital encounter of 05/16/18 DG Lumbar Spine 2-3 Views  Narrative CLINICAL DATA:  35 year old female status post fall with acute pain.  EXAM: LUMBAR SPINE - 2-3 VIEW  COMPARISON:  Sacroiliac radiographs today. CT Abdomen and Pelvis 03/08/2018.  FINDINGS: Vestigial ribs are designated at L1 with otherwise normal lumbar segmentation. There are full size ribs at T12. Vestigial S1-S2 disc space.  Stable vertebral height and alignment since February. Mild grade 1 anterolisthesis of L5 on S1 with chronic bilateral L5 pars fractures. Partially calcified disc bulging at that level. Relatively preserved disc spaces. No acute osseous abnormality identified. Sacral ala and SI joints appear normal. Negative abdominal visceral  contours.  IMPRESSION: 1. Mildly transitional lumbosacral anatomy. Correlation with radiographs is recommended prior to any operative intervention. 2. Chronic bilateral L5 pars fractures with stable grade 1 anterolisthesis of L5 on S1. 3. No acute osseous abnormality identified in the lumbar spine.   Electronically Signed By: Genevie Ann M.D. On: 05/16/2018 01:46  Complexity Note: Imaging results reviewed.   Allergies  Ms. Childers is allergic to magnesium-containing compounds, toradol [ketorolac tromethamine], tramadol, tamiflu [oseltamivir phosphate], tylenol [acetaminophen], gabapentin, and vyvanse [lisdexamfetamine].  Laboratory Chemistry Profile   Renal Lab Results  Component Value Date   BUN 12 03/11/2020   CREATININE 0.61 03/11/2020   LABCREA 82.4 03/11/2020   BCR 20 03/11/2020   GFRAA 137 03/11/2020   GFRNONAA 119 03/11/2020   SPECGRAV 1.020 05/12/2018   PHUR 7.0 05/12/2018   PROTEINUR NEGATIVE 01/22/2019     Electrolytes Lab Results  Component Value Date   NA 140 03/11/2020   K 4.4 03/11/2020   CL 106 03/11/2020   CALCIUM 8.6 (L) 03/11/2020     Hepatic Lab Results  Component Value Date   AST 16 03/11/2020   ALT 13 03/11/2020   ALBUMIN 4.2 03/11/2020   ALKPHOS 69 03/11/2020   LIPASE 27 01/22/2019     ID Lab Results  Component Value Date   HIV NON REACTIVE 06/17/2017   SARSCOV2NAA NEGATIVE 02/07/2019   PREGTESTUR NEGATIVE 02/09/2019     Bone Lab Results  Component Value Date   VD25OH 31.0 03/11/2020     Endocrine Lab Results  Component Value Date  GLUCOSE 92 03/11/2020   GLUCOSEU NEGATIVE 01/22/2019   HGBA1C 5.5 11/20/2015   TSH 2.870 03/01/2018     Neuropathy Lab Results  Component Value Date   HGBA1C 5.5 11/20/2015   HIV NON REACTIVE 06/17/2017     CNS No results found.   Inflammation (CRP: Acute  ESR: Chronic) No results found.   Rheumatology No results found.  Coagulation Lab Results  Component Value Date   PLT 180  01/22/2019     Cardiovascular Lab Results  Component Value Date   HGB 11.8 (L) 01/22/2019   HCT 33.1 (L) 01/22/2019     Screening Lab Results  Component Value Date   SARSCOV2NAA NEGATIVE 02/07/2019   HIV NON REACTIVE 06/17/2017   PREGTESTUR NEGATIVE 02/09/2019     Cancer No results found.   Allergens No results found.     Note: Lab results reviewed.  [place]   Past Surgical History:  Procedure Laterality Date  . CHOLECYSTECTOMY    . DILATION AND CURETTAGE OF UTERUS  2014   WS  . LAPAROSCOPIC LYSIS OF ADHESIONS  06/20/2017   Procedure: LAPAROSCOPIC LYSIS OF ADHESIONS;  Surgeon: Brayton Mars, MD;  Location: [facility] ORS;  Service: Gynecology;;  . LAPAROSCOPY N/A 06/20/2017   Procedure: OPERATIVE DIAGNOSTIC WITH BIOPSIES;  Surgeon: Brayton Mars, MD;  Location: [facility] ORS;  Service: Gynecology;  Laterality: N/A;  . TUBAL LIGATION Bilateral 02/21/2015   Procedure: BILATERAL TUBAL LIGATION;  Surgeon: Brayton Mars, MD;  Location: [facility] ORS;  Service: Gynecology;  Laterality: Bilateral;   Active Ambulatory Problems    Diagnosis Date Noted  . Chronic low back pain with right-sided sciatica 10/09/2014  . Family history of breast cancer 10/01/2015  . Menorrhagia with regular cycle 05/03/2017  . Dysmenorrhea 05/03/2017  . ADD (attention deficit disorder) 05/26/2017  . Chronic pain syndrome 06/29/2017  . 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 Myofascial pain 06/29/2017  . Status post laparoscopy with adhesio lysis 07/13/2017  . Controlled substance agreement signed 11/29/2017  . Anxiety 01/15/2019  . History of bilateral tubal ligation 04/06/2019  . Vitamin D deficiency 06/27/2019  . Rash and nonspecific skin eruption 11/28/2019  . Postherpetic neuralgia 12/10/2019  . Osteoarthritis of spine with radiculopathy, lumbar region 01/28/2020  . Cervical radiculopathy 05/17/2018  . Lumbar radiculopathy 05/17/2018  . Pharmacologic therapy 04/21/2020  . Disorder of  skeletal system 04/21/2020  . Problems influencing health status 04/21/2020   Resolved Ambulatory Problems    Diagnosis Date Noted  . Abdominal pain 10/24/2014  . Anemia 12/04/2014  . Abnormal glucose tolerance affecting pregnancy, antepartum 12/04/2014  . Gestational diabetes mellitus (GDM) affecting pregnancy, antepartum 12/07/2014  . Indication for care in labor or delivery 12/30/2014  . Premature uterine contractions causing threatened premature labor in third trimester 02/06/2015  . Labor and delivery indication for care or intervention 02/19/2015  . Gestational diabetes mellitus (GDM) 02/20/2015  . Vaginal delivery 02/21/2015  . Overweight 10/01/2015  . Lactational amenorrhea 10/01/2015  . Dysuria 10/10/2017   Past Medical History:  Diagnosis Date  . ADHD   . Family history of adverse reaction to anesthesia   . Fatty liver   . GERD (gastroesophageal reflux disease)   . History of kidney stones   . Liver cyst   . Vaginal Pap smear, abnormal 2012   Constitutional  BMI Assessment: Estimated body mass index is 21.61 kg/m as calculated from the following:   Height as of 03/11/20: 5' 3"  (1.6 m).   Weight as of  03/11/20: 122 lb (55.3 kg).  BMI Readings from Last 4 Encounters:  03/11/20 21.61 kg/m  10/23/19 20.90 kg/m  09/27/19 20.73 kg/m  09/21/19 20.90 kg/m   Wt Readings from Last 4 Encounters:  03/11/20 122 lb (55.3 kg)  10/23/19 118 lb (53.5 kg)  09/27/19 117 lb (53.1 kg)  09/21/19 118 lb (53.5 kg)    Note by: Gaspar Cola, MD Date: 04/21/2020; Time: 5:30 AM

## 2020-05-16 ENCOUNTER — Ambulatory Visit: Payer: Self-pay | Admitting: *Deleted

## 2020-05-16 ENCOUNTER — Other Ambulatory Visit: Payer: Self-pay

## 2020-05-16 MED ORDER — ONDANSETRON 8 MG PO TBDP: 8.0000 mg | ORAL_TABLET | Freq: Three times a day (TID) | ORAL | 0 refills | Status: DC | PRN

## 2020-05-16 MED ORDER — AMPHETAMINE-DEXTROAMPHETAMINE 20 MG PO TABS: 20.0000 mg | ORAL_TABLET | Freq: Two times a day (BID) | ORAL | 0 refills | Status: DC

## 2020-05-16 MED ORDER — AMPHETAMINE-DEXTROAMPHETAMINE 5 MG PO TABS: 5.0000 mg | ORAL_TABLET | Freq: Every day | ORAL | 0 refills | Status: DC

## 2020-05-16 MED ORDER — METHOCARBAMOL 500 MG PO TABS: 500.0000 mg | ORAL_TABLET | Freq: Two times a day (BID) | ORAL | 0 refills | Status: DC

## 2020-05-16 NOTE — Telephone Encounter (Signed)
Requested medication (s) are due for refill today: yes medications sent to wrong pharmacy   Requested medication (s) are on the active medication list: yes  Last refill:  05/16/20-06/15/20 #60 0 refills adderall  20 mg , 05/16/20- 06/15/20 #30 0 refills adderall 5 mg   Future visit scheduled: yes  Notes to clinic:  medications sent to wrong phamacy . Please assure sent to [place] [address]    Requested Prescriptions  Pending Prescriptions Disp Refills   amphetamine-dextroamphetamine (ADDERALL) 20 MG tablet 60 tablet 0    Sig: Take 1 tablet (20 mg total) by mouth 2 (two) times daily.      Not Delegated - Psychiatry:  Stimulants/ADHD Failed - 05/16/2020  6:23 PM      Failed - This refill cannot be delegated      Passed - Urine Drug Screen completed in last 360 days      Passed - Valid encounter within last 3 months    Recent Outpatient Visits           3 weeks ago COVID-19 virus RNA test result positive at limit of detection   [facility] Jon Billings, NP   2 months ago Attention deficit disorder (ADD) without hyperactivity   [facility] Jon Billings, NP   3 months ago Osteoarthritis of spine with radiculopathy, lumbar region   [facility], [place], DO   5 months ago Postherpetic neuralgia   [place], [place], DO   5 months ago Rash and nonspecific skin eruption   [place], NP       Future Appointments             In 5 days Jon Billings, NP [facility] [place], [place]   In 1 month Jon Billings, NP [place]               amphetamine-dextroamphetamine (ADDERALL) 5 MG tablet 30 tablet 0    Sig: Take 1 tablet (5 mg total) by mouth daily.      Not Delegated - Psychiatry:  Stimulants/ADHD Failed - 05/16/2020  6:23 PM      Failed - This refill cannot be delegated      Passed - Urine Drug Screen completed in  last 360 days      Passed - Valid encounter within last 3 months    Recent Outpatient Visits           3 weeks ago COVID-19 virus RNA test result positive at limit of detection   [place], NP   2 months ago Attention deficit disorder (ADD) without hyperactivity   [facility] Jon Billings, NP   3 months ago Osteoarthritis of spine with radiculopathy, lumbar region   [facility], [place], DO   5 months ago Postherpetic neuralgia   [place], [place], DO   5 months ago Rash and nonspecific skin eruption   [facility] Eulogio Bear, NP       Future Appointments             In 5 days Jon Billings, [place], [place]   In 1 month Jon Billings, NP [facility], [place]

## 2020-05-16 NOTE — Telephone Encounter (Signed)
Patient called back and confirmed she wanted [id number] [address]. Resubmitted medication request for adderall 20mg  and 5 mg to PCP. [facility]. Please advise.

## 2020-05-16 NOTE — Addendum Note (Signed)
Addended by: Jon Billings on: 05/16/2020 04:00 PM   Modules accepted: Orders

## 2020-05-16 NOTE — Telephone Encounter (Signed)
Pt wanted to know if there was anyway that a Nurse could approve her Adderall being moved from [place], [place] [id number] [place], [place] [address].  Pt has run out and wanted to know if there was anyway to transfer her controlled substance over so she will have it for the weekend. Please advise if this is possible.   Called patient review medication request. No answer and unable to leave voicemail. Will submit request to new pharmacy.

## 2020-05-17 ENCOUNTER — Other Ambulatory Visit: Payer: Self-pay | Admitting: Nurse Practitioner

## 2020-05-17 NOTE — Telephone Encounter (Signed)
[facility] [place] and spoke with Architectural technologist and requested prescription for Robaxin that was sent to [place] , to be transferred to [place]. Phone number for the [facility] in [place] given to [place].

## 2020-05-19 MED ORDER — AMPHETAMINE-DEXTROAMPHETAMINE 5 MG PO TABS: 5.0000 mg | ORAL_TABLET | Freq: Every day | ORAL | 0 refills | Status: DC

## 2020-05-19 MED ORDER — AMPHETAMINE-DEXTROAMPHETAMINE 20 MG PO TABS: 20.0000 mg | ORAL_TABLET | Freq: Two times a day (BID) | ORAL | 0 refills | Status: DC

## 2020-05-19 NOTE — Telephone Encounter (Signed)
Scheduled 5/4

## 2020-05-19 NOTE — Addendum Note (Signed)
Addended by: Jon Billings on: 05/19/2020 08:04 AM   Modules accepted: Orders

## 2020-05-21 ENCOUNTER — Telehealth: Payer: Medicaid Other | Admitting: Nurse Practitioner

## 2020-05-22 ENCOUNTER — Encounter: Payer: Self-pay | Admitting: Nurse Practitioner

## 2020-05-22 ENCOUNTER — Telehealth: Payer: Medicaid Other | Admitting: Nurse Practitioner

## 2020-05-22 DIAGNOSIS — J069 Acute upper respiratory infection, unspecified: Secondary | ICD-10-CM | POA: Diagnosis not present

## 2020-05-22 MED ORDER — GUAIFENESIN 100 MG/5ML PO SOLN: 5.0000 mL | ORAL | 0 refills | Status: DC | PRN

## 2020-05-22 MED ORDER — METHYLPREDNISOLONE 4 MG PO TBPK: ORAL_TABLET | ORAL | 0 refills | Status: DC

## 2020-05-22 MED ORDER — AZITHROMYCIN 250 MG PO TABS: ORAL_TABLET | ORAL | 0 refills | Status: AC

## 2020-05-22 NOTE — Progress Notes (Signed)
There were no vitals taken for this visit.   Subjective:    Patient ID: Cassandra Duarte, female    DOB: 1986-01-13, 35 y.o.   MRN: [id number]  HPI: Cassandra Duarte is a 35 y.o. female  Chief Complaint  Patient presents with  . Nasal Congestion  . Chills  . Cough  . Fatigue  . covid post test  . no taste   . Sinusitis  . Sore Throat   UPPER RESPIRATORY TRACT INFECTION Patient symptoms have been ongoing for about 1.5 weeks.  Worst symptom: Nasal congestion, chills, cough, fatigue  Fever: no Cough: yes Shortness of breath: no Wheezing: no Chest pain: yes, with cough Chest tightness: no Chest congestion: yes Nasal congestion: yes Runny nose: yes Post nasal drip: no Sneezing: no Sore throat: yes Swollen glands: no Sinus pressure: yes Headache: yes Face pain: no Toothache: no Ear pain: no bilateral Ear pressure: no bilateral Eyes red/itching:no Eye drainage/crusting: no  Vomiting: no Rash: no Fatigue: yes Sick contacts: no Strep contacts: no  Context: stable Recurrent sinusitis: no Relief with OTC cold/cough medications: no  Treatments attempted: none   Relevant past medical, surgical, family and social history reviewed and updated as indicated. Interim medical history since our last visit reviewed. Allergies and medications reviewed and updated.  Review of Systems  Constitutional: Positive for fatigue. Negative for fever.  HENT: Positive for congestion, sinus pressure and sore throat. Negative for dental problem, ear pain, postnasal drip, rhinorrhea, sinus pain and sneezing.   Respiratory: Positive for cough. Negative for shortness of breath and wheezing.   Cardiovascular: Negative for chest pain.  Gastrointestinal: Negative for vomiting.  Skin: Negative for rash.  Neurological: Positive for headaches.    Per HPI unless specifically indicated above     Objective:    There were no vitals taken for this visit.  Wt Readings from Last 3  Encounters:  03/11/20 122 lb (55.3 kg)  10/23/19 118 lb (53.5 kg)  09/27/19 117 lb (53.1 kg)    Physical Exam Vitals and nursing note reviewed.  HENT:     Head: Normocephalic.     Right Ear: Hearing normal.     Left Ear: Hearing normal.     Nose: Nose normal.  Eyes:     Pupils: Pupils are equal, round, and reactive to light.  Pulmonary:     Effort: Pulmonary effort is normal. No respiratory distress.  Neurological:     Mental Status: She is alert.  Psychiatric:        Mood and Affect: Mood normal.        Behavior: Behavior normal.        Thought Content: Thought content normal.        Judgment: Judgment normal.     Results for orders placed or performed in visit on 03/11/20  [id number] 11+Oxyco+Alc+Crt-Bund  Result Value Ref Range   Ethanol Negative Cutoff=0.020 %   Amphetamines, Urine Negative Cutoff=1000 ng/mL   Barbiturate Negative Cutoff=200 ng/mL   BENZODIAZ UR QL Negative Cutoff=200 ng/mL   Cannabinoid Quant, Ur Negative Cutoff=50 ng/mL   Cocaine (Metabolite) Negative Cutoff=300 ng/mL   OPIATE SCREEN URINE See Final Results Cutoff=300 ng/mL   Oxycodone/Oxymorphone, Urine Negative Cutoff=300 ng/mL   Phencyclidine Negative Cutoff=25 ng/mL   Methadone Screen, Urine Negative Cutoff=300 ng/mL   Propoxyphene Negative Cutoff=300 ng/mL   Meperidine Negative Cutoff=200 ng/mL   Tramadol Negative Cutoff=200 ng/mL   Creatinine 82.4 20.0 - 300.0 mg/dL   pH, Urine 5.6 4.5 - 8.9  Comp Met (CMET)  Result Value Ref Range   Glucose 92 65 - 99 mg/dL   BUN 12 6 - 20 mg/dL   Creatinine, Ser 0.61 0.57 - 1.00 mg/dL   GFR calc non Af Amer 119 >59 mL/min/1.73   GFR calc Af Amer 137 >59 mL/min/1.73   BUN/Creatinine Ratio 20 9 - 23   Sodium 140 134 - 144 mmol/L   Potassium 4.4 3.5 - 5.2 mmol/L   Chloride 106 96 - 106 mmol/L   CO2 19 (L) 20 - 29 mmol/L   Calcium 8.6 (L) 8.7 - 10.2 mg/dL   Total Protein 6.1 6.0 - 8.5 g/dL   Albumin 4.2 3.8 - 4.8 g/dL   Globulin, Total 1.9 1.5 - 4.5  g/dL   Albumin/Globulin Ratio 2.2 1.2 - 2.2   Bilirubin Total <0.2 0.0 - 1.2 mg/dL   Alkaline Phosphatase 69 44 - 121 IU/L   AST 16 0 - 40 IU/L   ALT 13 0 - 32 IU/L  Vitamin D (25 hydroxy)  Result Value Ref Range   Vit D, 25-Hydroxy 31.0 30.0 - 100.0 ng/mL  Opiates Confirmation, Urine  Result Value Ref Range   Opiates Positive (A) Cutoff=300 ng/mL     Codeine Negative Cutoff=300     Morphine Negative Cutoff=300     Hydromorphone Negative Cutoff=300     Hydrocodone Positive (A)       Hydrocodone Confirm 727 Cutoff=300 ng/mL      Assessment & Plan:   Problem List Items Addressed This Visit   None   Visit Diagnoses    Upper respiratory tract infection, unspecified type    -  Primary   Complete course of steroids and antibiotics. Use cough medicine PRN.  Return to clinic if symptoms worsen or fail to improve.    Relevant Medications   azithromycin (ZITHROMAX) 250 MG tablet       Follow up plan: Return if symptoms worsen or fail to improve.    This visit was completed via MyChart due to the restrictions of the COVID-19 pandemic. All issues as above were discussed and addressed. Physical exam was done as above through visual confirmation on MyChart. If it was felt that the patient should be evaluated in the office, they were directed there. The patient verbally consented to this visit. 1. Location of the patient: Home 2. Location of the provider: Office 3. Those involved with this call:  ? Provider: Jon Billings, NP ? CMA: Tammy Dulavey ? Front Desk/Registration: Jill Side 4. Time spent on call: 15 minutes with patient face to face via video conference. More than 50% of this time was spent in counseling and coordination of care. 20 minutes total spent in review of patient's record and preparation of their chart.

## 2020-05-26 DIAGNOSIS — T63304A Toxic effect of unspecified spider venom, undetermined, initial encounter: Secondary | ICD-10-CM | POA: Diagnosis not present

## 2020-05-27 ENCOUNTER — Encounter: Payer: Self-pay | Admitting: Nurse Practitioner

## 2020-05-29 NOTE — Progress Notes (Deleted)
No-show to the patient's initial evaluation by Dr. Dossie Arbour (pain management) on 04/21/2020 and again on 06/02/2020.

## 2020-06-02 ENCOUNTER — Encounter: Payer: Self-pay | Admitting: Pain Medicine

## 2020-06-02 ENCOUNTER — Other Ambulatory Visit: Payer: Self-pay | Admitting: Pain Medicine

## 2020-06-02 ENCOUNTER — Ambulatory Visit: Payer: Medicaid Other | Admitting: Pain Medicine

## 2020-06-02 DIAGNOSIS — Z789 Other specified health status: Secondary | ICD-10-CM

## 2020-06-02 DIAGNOSIS — Z91199 Patient's noncompliance with other medical treatment and regimen due to unspecified reason: Secondary | ICD-10-CM

## 2020-06-02 DIAGNOSIS — M899 Disorder of bone, unspecified: Secondary | ICD-10-CM

## 2020-06-02 DIAGNOSIS — G894 Chronic pain syndrome: Secondary | ICD-10-CM

## 2020-06-02 DIAGNOSIS — Z79899 Other long term (current) drug therapy: Secondary | ICD-10-CM

## 2020-06-02 DIAGNOSIS — Z5329 Procedure and treatment not carried out because of patient's decision for other reasons: Secondary | ICD-10-CM

## 2020-06-02 NOTE — Progress Notes (Signed)
No-show to the patient's initial evaluation by Dr. Tanaisha Pittman (pain management) on 04/21/2020 and again on 06/02/2020. 

## 2020-06-12 ENCOUNTER — Other Ambulatory Visit: Payer: Self-pay

## 2020-06-13 ENCOUNTER — Telehealth: Payer: Self-pay

## 2020-06-13 MED ORDER — AMPHETAMINE-DEXTROAMPHETAMINE 5 MG PO TABS: 5.0000 mg | ORAL_TABLET | Freq: Every day | ORAL | 0 refills | Status: DC

## 2020-06-13 MED ORDER — METHOCARBAMOL 500 MG PO TABS: 1.0000 | ORAL_TABLET | Freq: Two times a day (BID) | ORAL | 0 refills | Status: DC

## 2020-06-13 MED ORDER — AMPHETAMINE-DEXTROAMPHETAMINE 20 MG PO TABS: 20.0000 mg | ORAL_TABLET | Freq: Two times a day (BID) | ORAL | 0 refills | Status: DC

## 2020-06-13 NOTE — Telephone Encounter (Signed)
See my chart message

## 2020-06-13 NOTE — Telephone Encounter (Signed)
Copied from [place] [id number]. Topic: General - Other >> Jun 13, 2020  3:06 PM Tessa Lerner A wrote: Reason for CRM: Patient would like to be contacted by clinical staff regarding prescription coordination   The patient will be traveling out of town due to a death in their family   Patient would like to pick up their amphetamine-dextroamphetamine (ADDERALL) 20 MG tablet  early  Patient is requesting to be able to pick up medications early due to them being out of town when they're due to be refilled on the first   Please contact to further advise when possible

## 2020-06-16 ENCOUNTER — Encounter: Payer: Medicaid Other | Admitting: Nurse Practitioner

## 2020-07-04 DIAGNOSIS — M542 Cervicalgia: Secondary | ICD-10-CM | POA: Diagnosis not present

## 2020-07-04 DIAGNOSIS — S40011A Contusion of right shoulder, initial encounter: Secondary | ICD-10-CM | POA: Diagnosis not present

## 2020-07-16 ENCOUNTER — Telehealth: Payer: Self-pay

## 2020-07-16 NOTE — Telephone Encounter (Signed)
Routing to provider. Can the letter be changed per patient request?

## 2020-07-16 NOTE — Telephone Encounter (Signed)
Letter written for patient and sent via mychart.

## 2020-07-16 NOTE — Telephone Encounter (Signed)
Copied from [place] [id number]. Topic: General - Other >> Jul 16, 2020  2:56 PM Leward Quan A wrote: Reason for CRM: Patient called in to inform Jon Billings that she need the letter that was written for her on 04/21/20 to say that she tested positive on 04/09/20 instead of 04/19/20 but started her symptoms on 04/03/20. Patient also asking for Janett Billow in referral to give her a call back please. Asking for the nurse to call her on.   Ph# [phone]

## 2020-07-16 NOTE — Telephone Encounter (Signed)
Letter sent via my chart

## 2020-07-17 ENCOUNTER — Telehealth: Payer: Self-pay | Admitting: Nurse Practitioner

## 2020-07-17 ENCOUNTER — Other Ambulatory Visit: Payer: Self-pay

## 2020-07-17 MED ORDER — AMPHETAMINE-DEXTROAMPHETAMINE 20 MG PO TABS: 20.0000 mg | ORAL_TABLET | Freq: Two times a day (BID) | ORAL | 0 refills | Status: DC

## 2020-07-17 MED ORDER — AMPHETAMINE-DEXTROAMPHETAMINE 5 MG PO TABS: 5.0000 mg | ORAL_TABLET | Freq: Every day | ORAL | 0 refills | Status: DC

## 2020-07-17 NOTE — Telephone Encounter (Signed)
Responded to by prescription refill and Mychart message.

## 2020-07-17 NOTE — Telephone Encounter (Signed)
Pt called in stating she has an appt with [facility] tomorrow 07/01 to get a psych evaluation, she stated she would start getting her medication there but they will not be able to prescribe until her next appt in August, pt wants to know if PCP can give her a refill until her appt.    Medication Refill - Medication:  amphetamine-dextroamphetamine (ADDERALL) 20 MG tablet amphetamine-dextroamphetamine (ADDERALL) 5 MG tablet   Has the patient contacted their pharmacy? No.   Preferred Pharmacy (with phone number or street name):  [id number] [place] [place] [address]  [address], [place] [address]  Phone:  [phone]  Fax:  [phone]  Agent: Please be advised that RX refills may take up to 3 business days. We ask that you follow-up with your pharmacy.

## 2020-10-14 IMAGING — CR SACRUM AND COCCYX - 2+ VIEW
3 series · 3 of 3 positions shown · non-contrast
Comparison: None.

CLINICAL DATA: Fall yesterday with pain to the sacral area.

EXAM:
SACRUM AND COCCYX - 2+ VIEW

[coccyx ap]
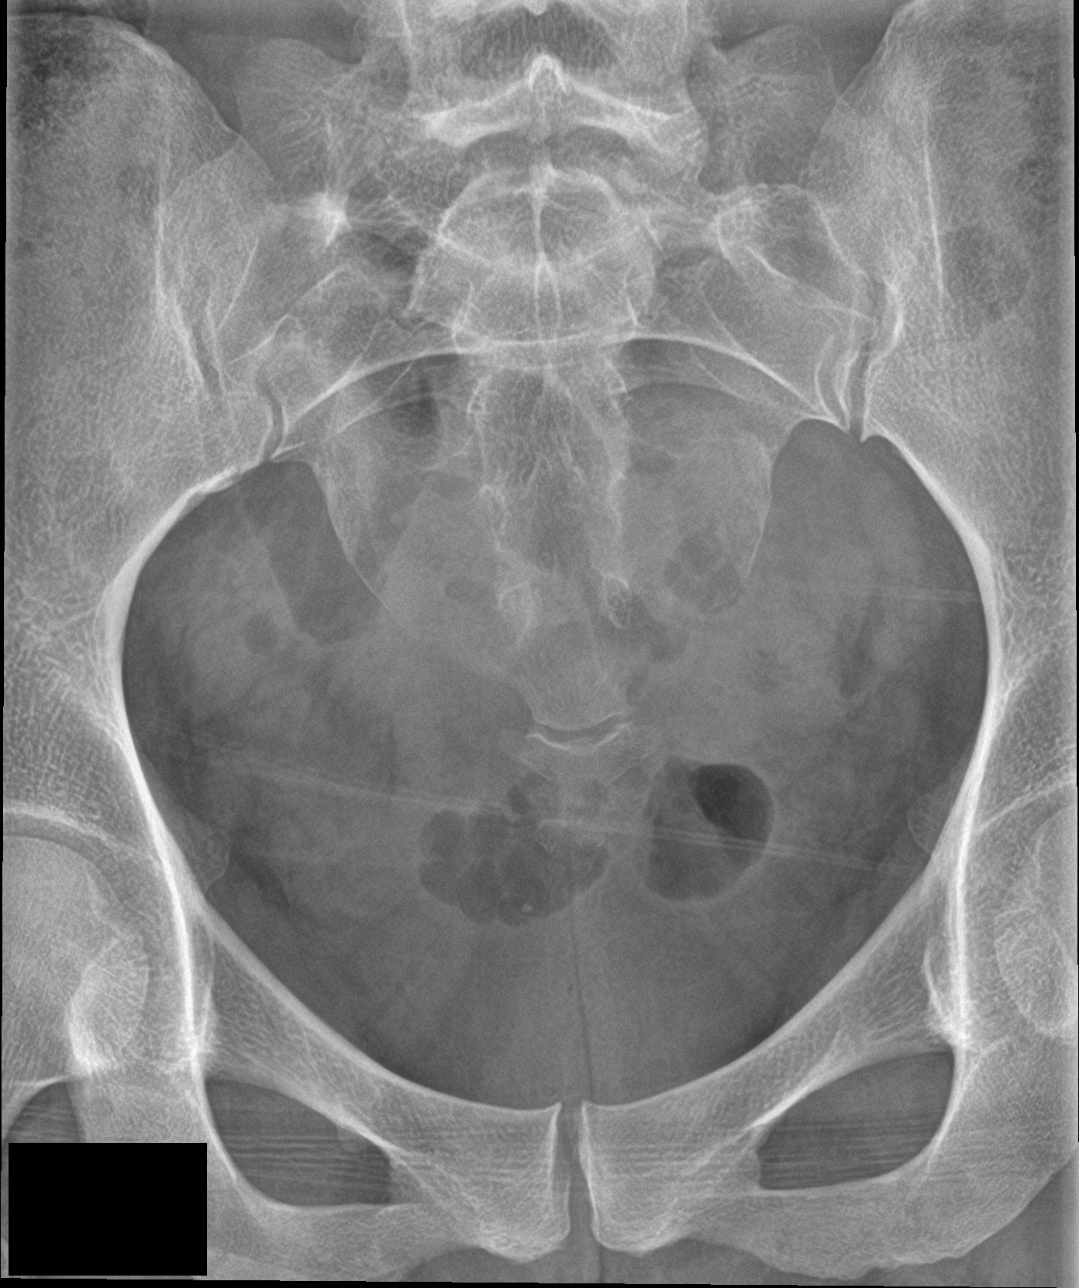

[sacrum ap]
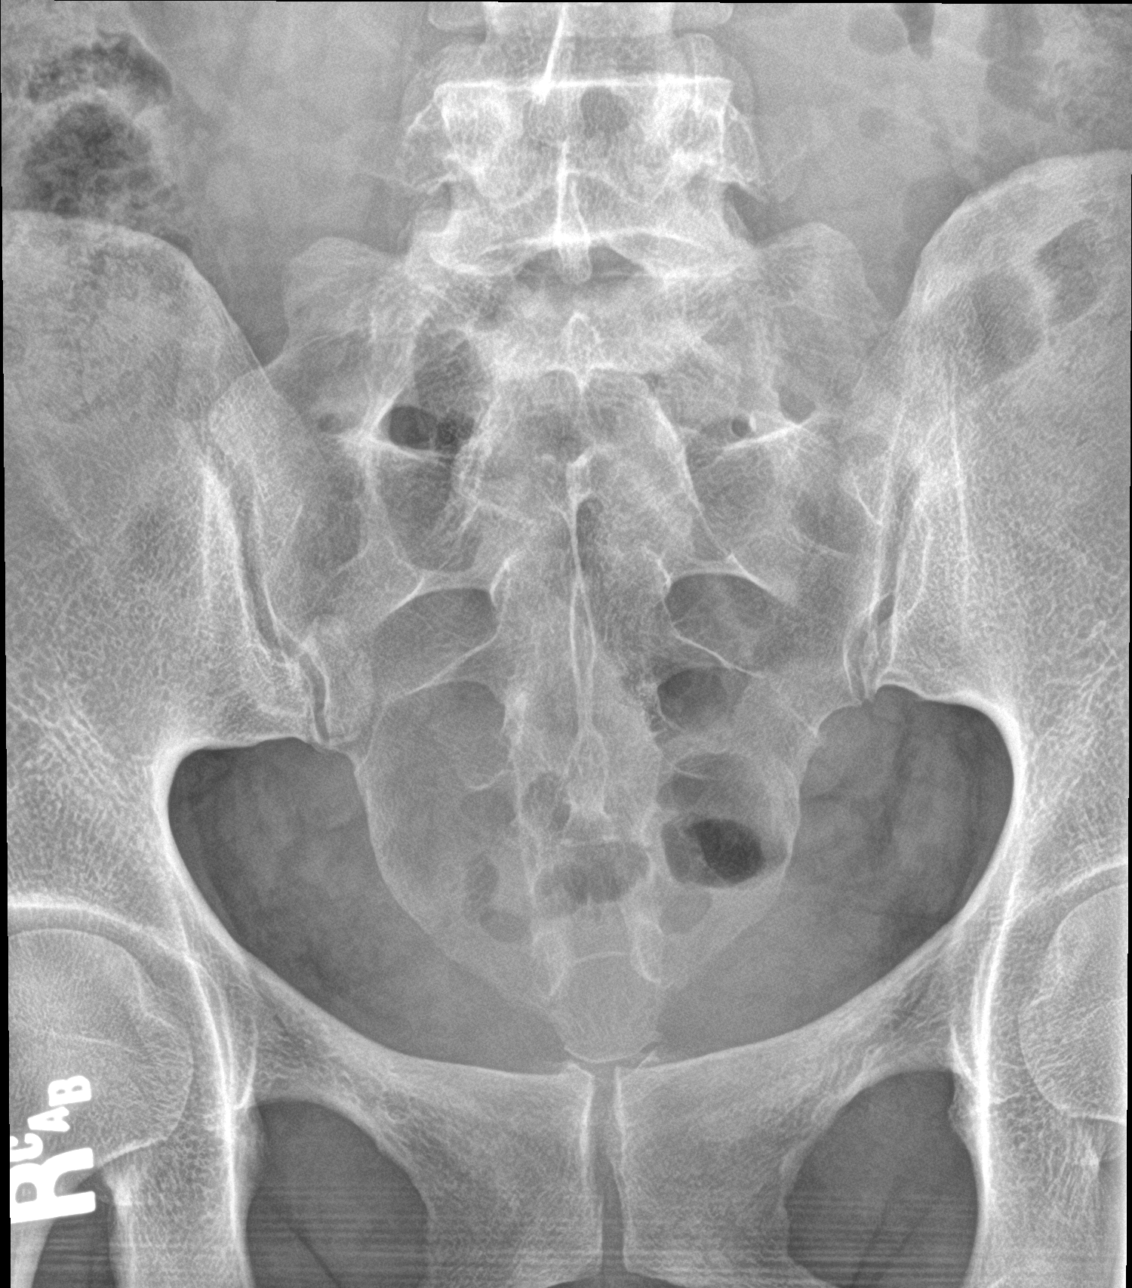

[sacrum lat]
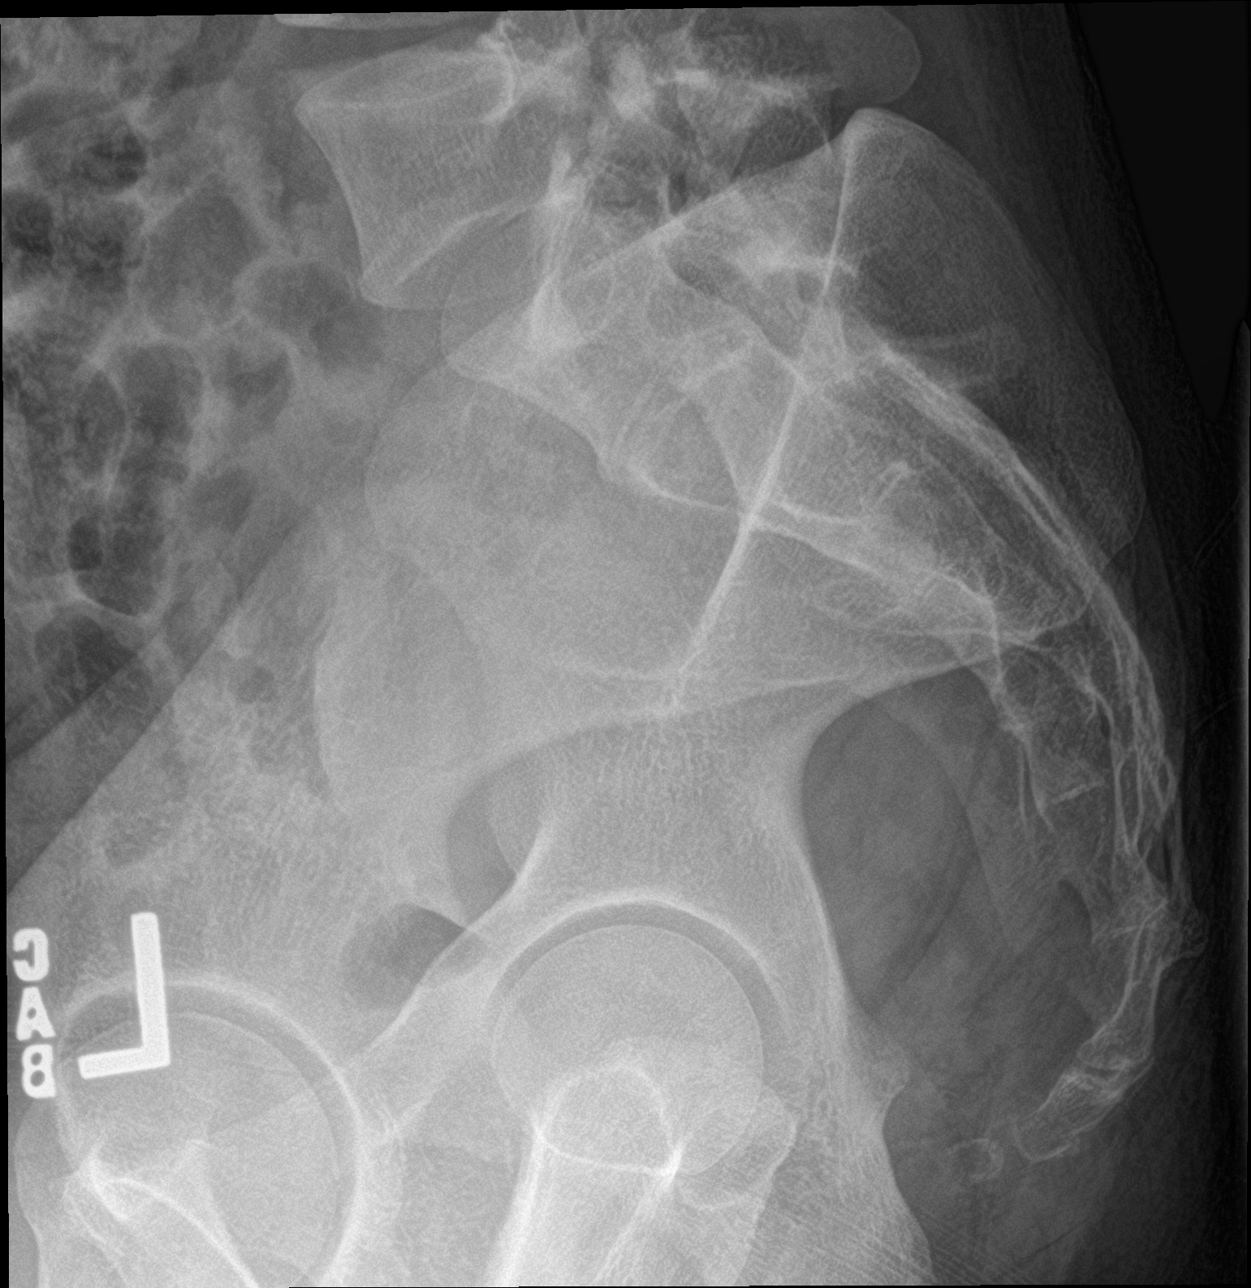

[3 of 3 positions shown; findings below may reference images not displayed]

FINDINGS: The sacrum and coccyx demonstrate no evidence of fracture or
dislocation. Sacroiliac joints are symmetric and normal in
appearance bilaterally. No bony lesions identified.
IMPRESSION: Normal sacrum and coccyx.

## 2020-10-17 IMAGING — CR LUMBAR SPINE - 2-3 VIEW
1 series · 3 of 3 positions shown · non-contrast
Comparison: Sacroiliac radiographs today. CT Abdomen and Pelvis
03/08/2018.

CLINICAL DATA: 32-year-old female status post fall with acute pain.

EXAM:
LUMBAR SPINE - 2-3 VIEW

[Series 1: dg lumbar spine 2-3 views · 0.14mm/px · 3 of 3 slices shown]
[im 1/3]
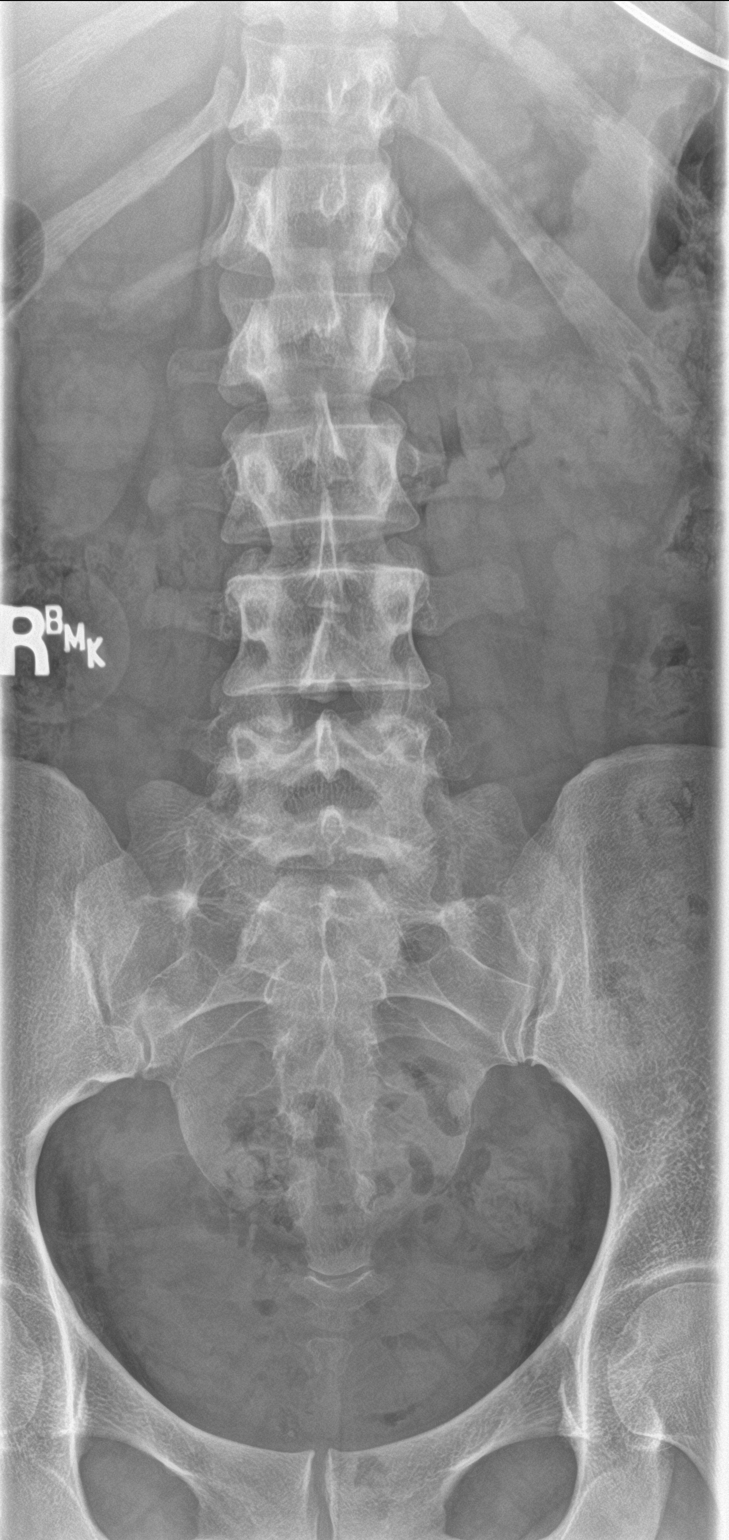
[im 2/3]
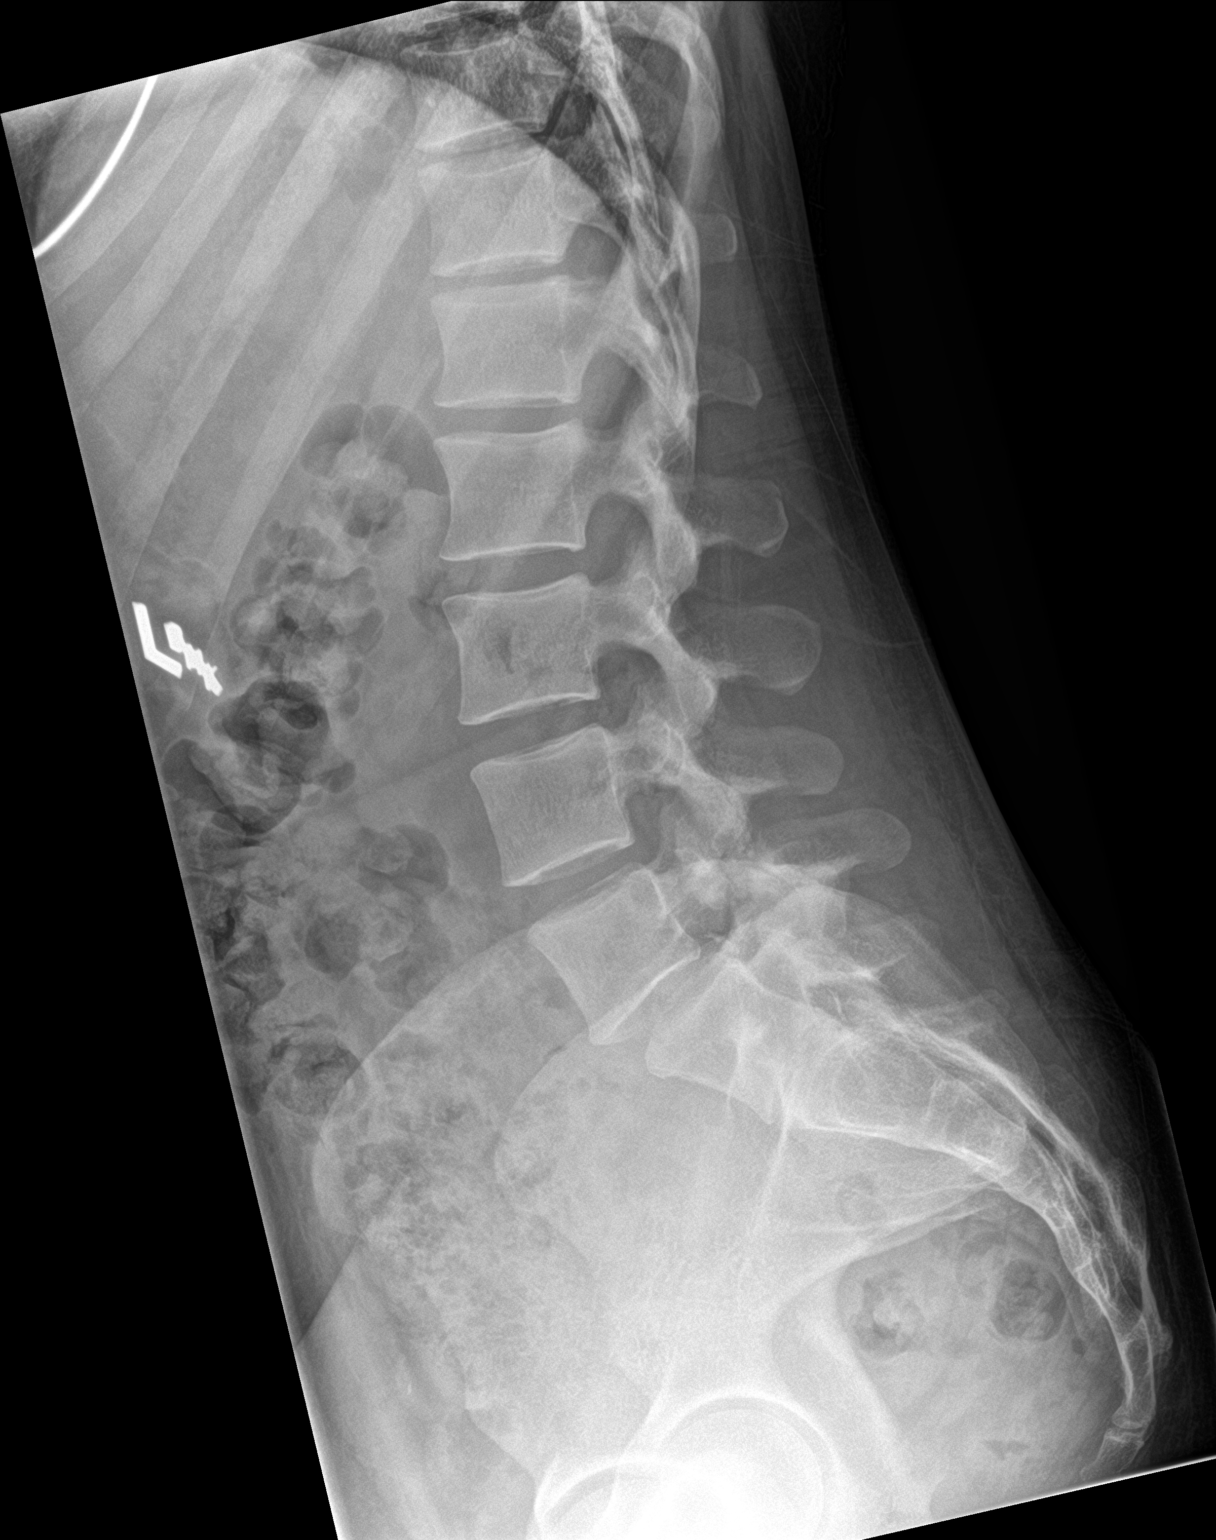
[im 3/3]
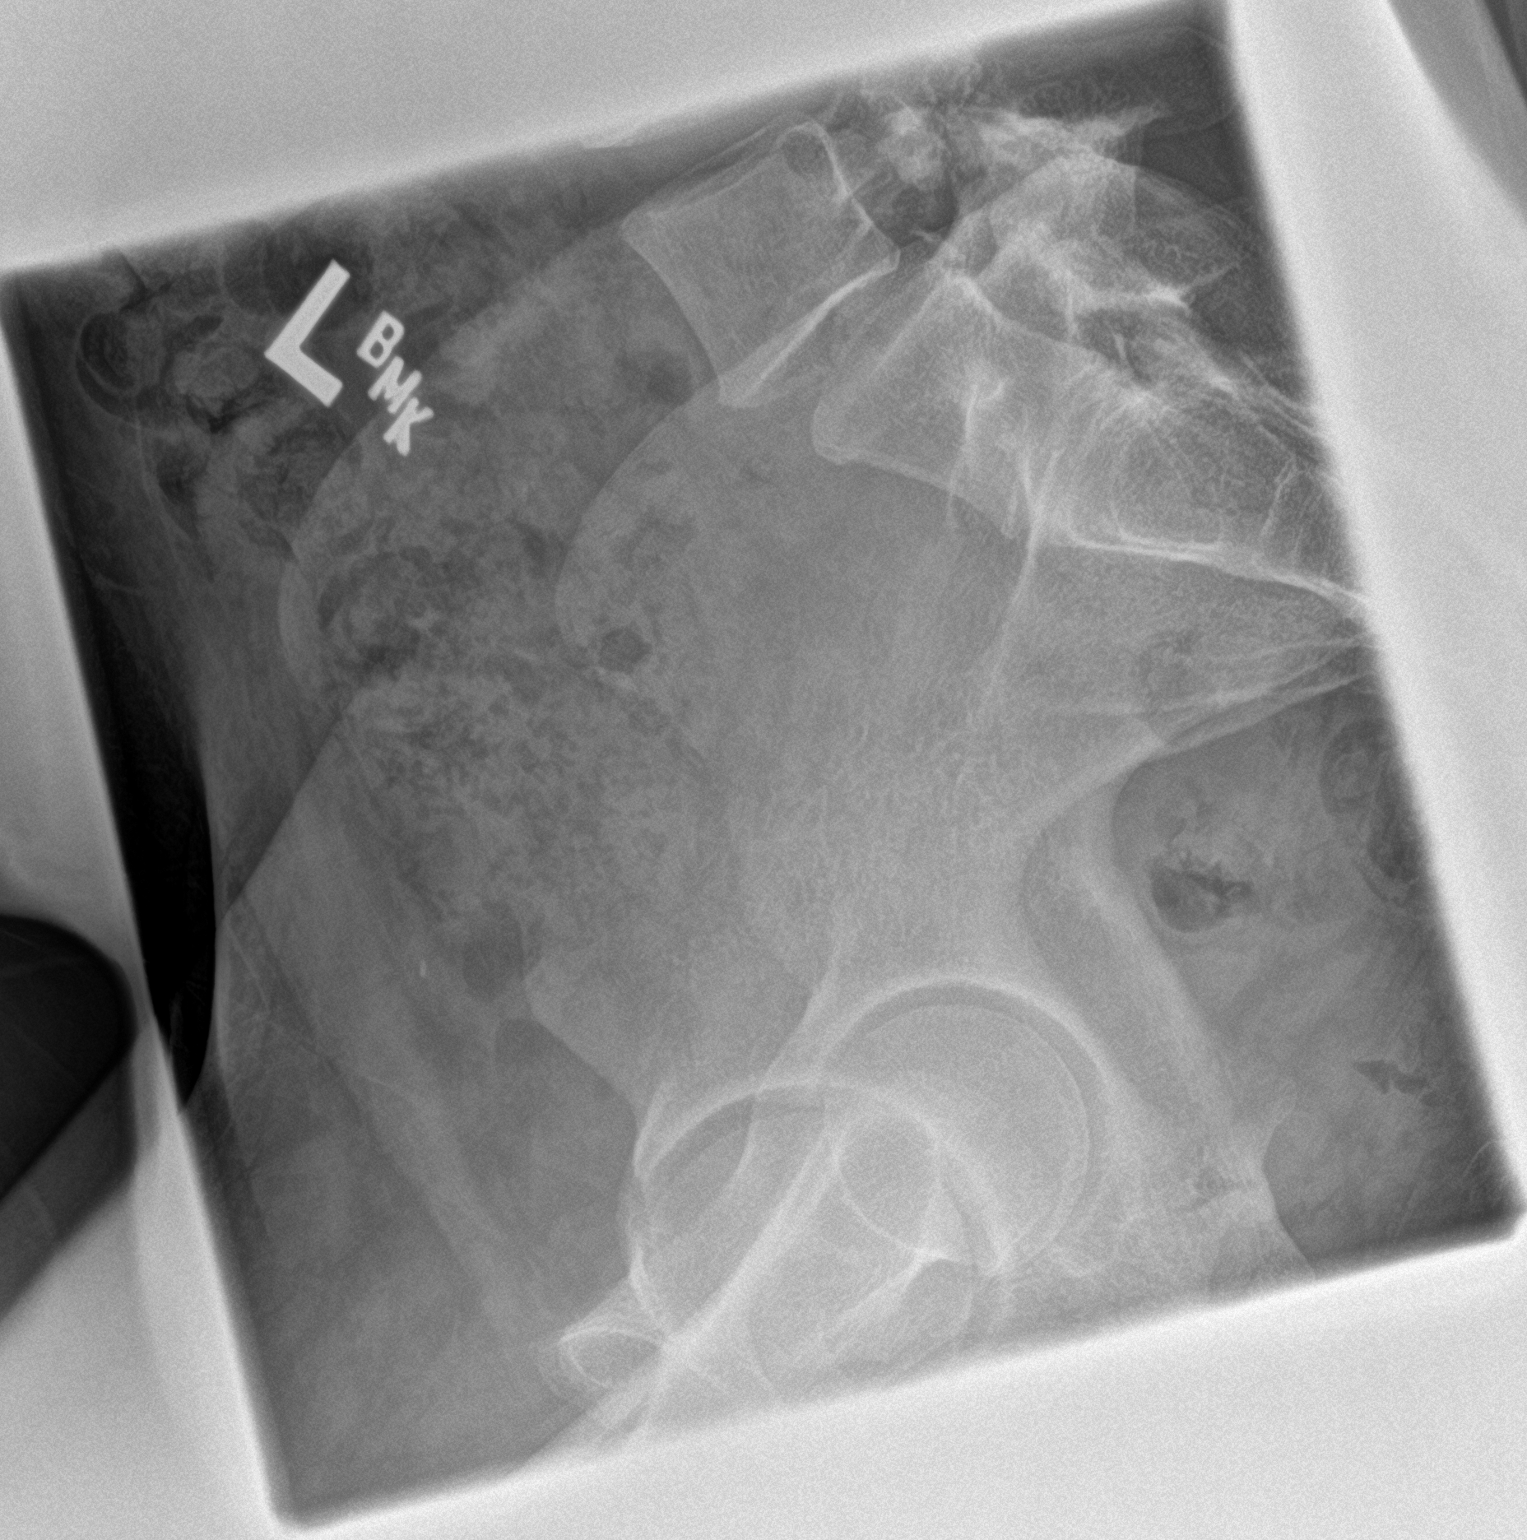

[3 of 3 positions shown; findings below may reference images not displayed]

FINDINGS: Vestigial ribs are designated at L1 with otherwise normal lumbar
segmentation. There are full size ribs at T12. Vestigial S1-S2 disc
space.

Stable vertebral height and alignment since [REDACTED]. Mild grade 1
anterolisthesis of L5 on S1 with chronic bilateral L5 pars
fractures. Partially calcified disc bulging at that level.
Relatively preserved disc spaces. No acute osseous abnormality
identified. Sacral ala and SI joints appear normal. Negative
abdominal visceral contours.
IMPRESSION: 1. Mildly transitional lumbosacral anatomy. Correlation with
radiographs is recommended prior to any operative intervention.
2. Chronic bilateral L5 pars fractures with stable grade 1
anterolisthesis of L5 on S1.
3. No acute osseous abnormality identified in the lumbar spine.

## 2020-11-09 DIAGNOSIS — F900 Attention-deficit hyperactivity disorder, predominantly inattentive type: Secondary | ICD-10-CM | POA: Diagnosis not present

## 2020-11-14 DIAGNOSIS — F900 Attention-deficit hyperactivity disorder, predominantly inattentive type: Secondary | ICD-10-CM | POA: Diagnosis not present

## 2020-11-20 DIAGNOSIS — F900 Attention-deficit hyperactivity disorder, predominantly inattentive type: Secondary | ICD-10-CM | POA: Diagnosis not present

## 2020-11-24 DIAGNOSIS — F900 Attention-deficit hyperactivity disorder, predominantly inattentive type: Secondary | ICD-10-CM | POA: Diagnosis not present

## 2020-11-26 DIAGNOSIS — F900 Attention-deficit hyperactivity disorder, predominantly inattentive type: Secondary | ICD-10-CM | POA: Diagnosis not present

## 2020-11-27 DIAGNOSIS — F900 Attention-deficit hyperactivity disorder, predominantly inattentive type: Secondary | ICD-10-CM | POA: Diagnosis not present

## 2020-11-28 DIAGNOSIS — F900 Attention-deficit hyperactivity disorder, predominantly inattentive type: Secondary | ICD-10-CM | POA: Diagnosis not present

## 2021-01-03 DIAGNOSIS — F900 Attention-deficit hyperactivity disorder, predominantly inattentive type: Secondary | ICD-10-CM | POA: Diagnosis not present

## 2021-01-10 DIAGNOSIS — F900 Attention-deficit hyperactivity disorder, predominantly inattentive type: Secondary | ICD-10-CM | POA: Diagnosis not present

## 2021-01-21 DIAGNOSIS — S161XXA Strain of muscle, fascia and tendon at neck level, initial encounter: Secondary | ICD-10-CM | POA: Diagnosis not present

## 2021-01-21 DIAGNOSIS — M542 Cervicalgia: Secondary | ICD-10-CM | POA: Diagnosis not present

## 2021-01-21 DIAGNOSIS — M47812 Spondylosis without myelopathy or radiculopathy, cervical region: Secondary | ICD-10-CM | POA: Diagnosis not present

## 2021-01-31 DIAGNOSIS — R1031 Right lower quadrant pain: Secondary | ICD-10-CM | POA: Diagnosis not present

## 2021-01-31 DIAGNOSIS — N939 Abnormal uterine and vaginal bleeding, unspecified: Secondary | ICD-10-CM | POA: Diagnosis not present

## 2021-01-31 DIAGNOSIS — Z885 Allergy status to narcotic agent status: Secondary | ICD-10-CM | POA: Diagnosis not present

## 2021-01-31 DIAGNOSIS — R102 Pelvic and perineal pain: Secondary | ICD-10-CM | POA: Diagnosis not present

## 2021-01-31 DIAGNOSIS — Z886 Allergy status to analgesic agent status: Secondary | ICD-10-CM | POA: Diagnosis not present

## 2021-01-31 DIAGNOSIS — M25511 Pain in right shoulder: Secondary | ICD-10-CM | POA: Diagnosis not present

## 2021-02-01 ENCOUNTER — Telehealth: Payer: Self-pay

## 2021-02-01 DIAGNOSIS — M5412 Radiculopathy, cervical region: Secondary | ICD-10-CM | POA: Diagnosis not present

## 2021-02-01 NOTE — Telephone Encounter (Signed)
Transition Care Management Unsuccessful Follow-up Telephone Call  Date of discharge and from where:  01/31/2021-[facility]   Attempts:  1st Attempt  Reason for unsuccessful TCM follow-up call:  Voice mail full

## 2021-02-02 NOTE — Telephone Encounter (Signed)
Transition Care Management Unsuccessful Follow-up Telephone Call  Date of discharge and from where:  01/31/2021 from [facility]  Attempts:  2nd Attempt  Reason for unsuccessful TCM follow-up call:  Voice mail full

## 2021-02-04 DIAGNOSIS — M5412 Radiculopathy, cervical region: Secondary | ICD-10-CM | POA: Diagnosis not present

## 2021-02-04 NOTE — Telephone Encounter (Signed)
Transition Care Management Unsuccessful Follow-up Telephone Call  Date of discharge and from where:  01/31/2021 [facility]  Attempts:  3rd Attempt  Reason for unsuccessful TCM follow-up call:  Unable to reach patient

## 2021-02-05 ENCOUNTER — Encounter: Payer: Self-pay | Admitting: Emergency Medicine

## 2021-02-05 ENCOUNTER — Emergency Department
Admission: EM | Admit: 2021-02-05 | Discharge: 2021-02-05 | Disposition: A | Discharge status: Home / Self Care | Payer: Medicaid Other | Attending: Emergency Medicine | Admitting: Emergency Medicine

## 2021-02-05 ENCOUNTER — Other Ambulatory Visit: Payer: Self-pay

## 2021-02-05 ENCOUNTER — Encounter: Payer: Self-pay | Admitting: Internal Medicine

## 2021-02-05 ENCOUNTER — Ambulatory Visit: Payer: Medicaid Other | Admitting: Internal Medicine

## 2021-02-05 ENCOUNTER — Ambulatory Visit: Payer: Self-pay

## 2021-02-05 VITALS — BP 121/80 | HR 71 | Temp 98.7°F | Ht 62.99 in | Wt 121.6 lb

## 2021-02-05 DIAGNOSIS — M542 Cervicalgia: Secondary | ICD-10-CM | POA: Insufficient documentation

## 2021-02-05 DIAGNOSIS — R519 Headache, unspecified: Secondary | ICD-10-CM | POA: Diagnosis not present

## 2021-02-05 DIAGNOSIS — Z5321 Procedure and treatment not carried out due to patient leaving prior to being seen by health care provider: Secondary | ICD-10-CM | POA: Insufficient documentation

## 2021-02-05 MED ORDER — ACETAMINOPHEN 500 MG PO TABS: 1000.0000 mg | ORAL_TABLET | Freq: Once | ORAL | Status: DC

## 2021-02-05 MED ORDER — LIDOCAINE HCL (PF) 1 % IJ SOLN: 10.0000 mL | Freq: Once | INTRAMUSCULAR | Status: DC

## 2021-02-05 MED ORDER — KETOROLAC TROMETHAMINE 30 MG/ML IJ SOLN: 30.0000 mg | Freq: Once | INTRAMUSCULAR | Status: DC

## 2021-02-05 NOTE — Telephone Encounter (Signed)
°  Chief Complaint: neck pain and numbness Symptoms: R neck pain and numbness that radiates into R shoulder, arm and R side of face, 9/10 pain level Frequency: 3 weeks ago, numbness started 3 days ago Pertinent Negatives: NA Disposition: [] ED /[] Urgent Care (no appt availability in office) / [x] Appointment(In office/virtual)/ []  [facility]/ [] Home Care/ [] Refused Recommended Disposition /[] [place] [facility]/ []  Follow-up with PCP Additional Notes: Pt was seen in ED on 01/31/21 and went this morning to [facility] ED but left before being seen.    Reason for Disposition  [1] SEVERE neck pain (e.g., excruciating, unable to do any normal activities) AND [2] not improved after 2 hours of pain medicine  Answer Assessment - Initial Assessment Questions 1. ONSET: "When did the pain begin?"      3 weeks ago 2. LOCATION: "Where does it hurt?"      R neck, shoulder, arm 3. PATTERN "Does the pain come and go, or has it been constant since it started?"      constant 4. SEVERITY: "How bad is the pain?"  (Scale 1-10; or mild, moderate, severe)   - NO PAIN (0): no pain or only slight stiffness    - MILD (1-3): doesn't interfere with normal activities    - MODERATE (4-7): interferes with normal activities or awakens from sleep    - SEVERE (8-10):  excruciating pain, unable to do any normal activities      9 5. RADIATION: "Does the pain go anywhere else, shoot into your arms?"     Into shoulder and arm 6. CORD SYMPTOMS: "Any weakness or numbness of the arms or legs?"     Yes in R arm, 3 days ago 8. NECK OVERUSE: "Any recent activities that involved turning or twisting the neck?"     Son jumped on her back in Dec and then felt pain since then 9. OTHER SYMPTOMS: "Do you have any other symptoms?" (e.g., headache, fever, chest pain, difficulty breathing, neck swelling)     Headache and numbness on R side of face  Protocols used: Neck Pain or Stiffness-A-AH

## 2021-02-05 NOTE — ED Provider Notes (Signed)
----------------------------------------- °  7:17 AM on 02/05/2021 -----------------------------------------  I signed up for this patient when she was first brought to her room.  Unfortunately I received multiple other patients at the same time, and before I could evaluate this patient I was called upstairs for a CODE BLUE.  When I came back down to the emergency department there were 2 other new and critical patients requiring my immediate attention.  I was unable to see this patient prior to shift change.  Immediately after shift change, the patient decided that she needed to leave and could not stay any longer for evaluation so unfortunately she was not seen by medical provider.   Hinda Kehr, MD 02/05/21 [phone]

## 2021-02-05 NOTE — ED Triage Notes (Signed)
Patient ambulatory to triage with steady gait, without difficulty or distress noted; pt reports x 3 wks having pain to rt side neck and HA; neck pain increases with ROM head; denies any injury; seen by ortho and rx meloxicam; sched for MRI next wk but pain increasing
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
## 2021-02-05 NOTE — ED Notes (Addendum)
This writer stopped in room to introduce self to Pt.  Pt reports she "can't stay because she has no one to take her kids to school."  Pt informed medications had been ordered.  Pt sts she "has all of those medications at home and was told if she returned she would be a CT scan."  Pt informed the medications might not be her complete plan of care and the EDP would be coming in to speak to her.  Pt again stated that she "has no one to take her kids to school, she cannot stay, and she will follow--up at [facility],"  which is where she was previously seen.  Pt would not stay to speak to EDP.     Pt shown to lobby and EDP made aware.

## 2021-02-06 DIAGNOSIS — F900 Attention-deficit hyperactivity disorder, predominantly inattentive type: Secondary | ICD-10-CM | POA: Diagnosis not present

## 2021-02-08 DIAGNOSIS — F900 Attention-deficit hyperactivity disorder, predominantly inattentive type: Secondary | ICD-10-CM | POA: Diagnosis not present

## 2021-02-10 DIAGNOSIS — F900 Attention-deficit hyperactivity disorder, predominantly inattentive type: Secondary | ICD-10-CM | POA: Diagnosis not present

## 2021-02-11 DIAGNOSIS — F900 Attention-deficit hyperactivity disorder, predominantly inattentive type: Secondary | ICD-10-CM | POA: Diagnosis not present

## 2021-02-17 DIAGNOSIS — F900 Attention-deficit hyperactivity disorder, predominantly inattentive type: Secondary | ICD-10-CM | POA: Diagnosis not present

## 2021-02-20 DIAGNOSIS — F900 Attention-deficit hyperactivity disorder, predominantly inattentive type: Secondary | ICD-10-CM | POA: Diagnosis not present

## 2021-02-25 DIAGNOSIS — F900 Attention-deficit hyperactivity disorder, predominantly inattentive type: Secondary | ICD-10-CM | POA: Diagnosis not present

## 2021-02-26 DIAGNOSIS — F900 Attention-deficit hyperactivity disorder, predominantly inattentive type: Secondary | ICD-10-CM | POA: Diagnosis not present

## 2021-03-08 DIAGNOSIS — F900 Attention-deficit hyperactivity disorder, predominantly inattentive type: Secondary | ICD-10-CM | POA: Diagnosis not present

## 2021-03-13 DIAGNOSIS — F900 Attention-deficit hyperactivity disorder, predominantly inattentive type: Secondary | ICD-10-CM | POA: Diagnosis not present

## 2021-03-20 DIAGNOSIS — F900 Attention-deficit hyperactivity disorder, predominantly inattentive type: Secondary | ICD-10-CM | POA: Diagnosis not present

## 2021-03-31 DIAGNOSIS — F900 Attention-deficit hyperactivity disorder, predominantly inattentive type: Secondary | ICD-10-CM | POA: Diagnosis not present

## 2021-04-03 DIAGNOSIS — F900 Attention-deficit hyperactivity disorder, predominantly inattentive type: Secondary | ICD-10-CM | POA: Diagnosis not present

## 2021-04-06 DIAGNOSIS — F900 Attention-deficit hyperactivity disorder, predominantly inattentive type: Secondary | ICD-10-CM | POA: Diagnosis not present

## 2021-04-19 DIAGNOSIS — F900 Attention-deficit hyperactivity disorder, predominantly inattentive type: Secondary | ICD-10-CM | POA: Diagnosis not present

## 2021-04-22 DIAGNOSIS — F900 Attention-deficit hyperactivity disorder, predominantly inattentive type: Secondary | ICD-10-CM | POA: Diagnosis not present

## 2021-04-25 DIAGNOSIS — F900 Attention-deficit hyperactivity disorder, predominantly inattentive type: Secondary | ICD-10-CM | POA: Diagnosis not present

## 2021-05-06 DIAGNOSIS — F900 Attention-deficit hyperactivity disorder, predominantly inattentive type: Secondary | ICD-10-CM | POA: Diagnosis not present

## 2021-05-08 DIAGNOSIS — F900 Attention-deficit hyperactivity disorder, predominantly inattentive type: Secondary | ICD-10-CM | POA: Diagnosis not present

## 2021-05-13 DIAGNOSIS — F900 Attention-deficit hyperactivity disorder, predominantly inattentive type: Secondary | ICD-10-CM | POA: Diagnosis not present

## 2021-05-17 DIAGNOSIS — F900 Attention-deficit hyperactivity disorder, predominantly inattentive type: Secondary | ICD-10-CM | POA: Diagnosis not present

## 2021-05-21 DIAGNOSIS — F900 Attention-deficit hyperactivity disorder, predominantly inattentive type: Secondary | ICD-10-CM | POA: Diagnosis not present

## 2021-05-23 DIAGNOSIS — F900 Attention-deficit hyperactivity disorder, predominantly inattentive type: Secondary | ICD-10-CM | POA: Diagnosis not present

## 2021-05-27 DIAGNOSIS — F900 Attention-deficit hyperactivity disorder, predominantly inattentive type: Secondary | ICD-10-CM | POA: Diagnosis not present

## 2021-05-29 DIAGNOSIS — F900 Attention-deficit hyperactivity disorder, predominantly inattentive type: Secondary | ICD-10-CM | POA: Diagnosis not present

## 2021-06-18 DIAGNOSIS — F900 Attention-deficit hyperactivity disorder, predominantly inattentive type: Secondary | ICD-10-CM | POA: Diagnosis not present

## 2021-06-20 DIAGNOSIS — F900 Attention-deficit hyperactivity disorder, predominantly inattentive type: Secondary | ICD-10-CM | POA: Diagnosis not present

## 2021-06-24 DIAGNOSIS — F900 Attention-deficit hyperactivity disorder, predominantly inattentive type: Secondary | ICD-10-CM | POA: Diagnosis not present

## 2021-06-25 DIAGNOSIS — F900 Attention-deficit hyperactivity disorder, predominantly inattentive type: Secondary | ICD-10-CM | POA: Diagnosis not present

## 2021-07-01 DIAGNOSIS — F900 Attention-deficit hyperactivity disorder, predominantly inattentive type: Secondary | ICD-10-CM | POA: Diagnosis not present

## 2021-07-03 DIAGNOSIS — F900 Attention-deficit hyperactivity disorder, predominantly inattentive type: Secondary | ICD-10-CM | POA: Diagnosis not present

## 2021-07-07 DIAGNOSIS — F900 Attention-deficit hyperactivity disorder, predominantly inattentive type: Secondary | ICD-10-CM | POA: Diagnosis not present

## 2021-07-10 DIAGNOSIS — F900 Attention-deficit hyperactivity disorder, predominantly inattentive type: Secondary | ICD-10-CM | POA: Diagnosis not present

## 2021-07-15 DIAGNOSIS — F900 Attention-deficit hyperactivity disorder, predominantly inattentive type: Secondary | ICD-10-CM | POA: Diagnosis not present

## 2021-07-17 DIAGNOSIS — F900 Attention-deficit hyperactivity disorder, predominantly inattentive type: Secondary | ICD-10-CM | POA: Diagnosis not present

## 2021-07-23 DIAGNOSIS — F900 Attention-deficit hyperactivity disorder, predominantly inattentive type: Secondary | ICD-10-CM | POA: Diagnosis not present

## 2021-07-24 DIAGNOSIS — F900 Attention-deficit hyperactivity disorder, predominantly inattentive type: Secondary | ICD-10-CM | POA: Diagnosis not present

## 2021-07-28 DIAGNOSIS — F900 Attention-deficit hyperactivity disorder, predominantly inattentive type: Secondary | ICD-10-CM | POA: Diagnosis not present

## 2021-07-31 ENCOUNTER — Ambulatory Visit
Admission: RE | Admit: 2021-07-31 | Discharge: 2021-07-31 | Disposition: A | Discharge status: Home / Self Care | Payer: Medicaid Other | Source: Ambulatory Visit

## 2021-07-31 VITALS — BP 126/84 | HR 84 | Temp 97.8°F | Resp 18

## 2021-07-31 DIAGNOSIS — R110 Nausea: Secondary | ICD-10-CM

## 2021-07-31 DIAGNOSIS — F900 Attention-deficit hyperactivity disorder, predominantly inattentive type: Secondary | ICD-10-CM | POA: Diagnosis not present

## 2021-07-31 DIAGNOSIS — K0889 Other specified disorders of teeth and supporting structures: Secondary | ICD-10-CM | POA: Diagnosis not present

## 2021-07-31 MED ORDER — ONDANSETRON 4 MG PO TBDP: 4.0000 mg | ORAL_TABLET | Freq: Three times a day (TID) | ORAL | 0 refills | Status: DC | PRN

## 2021-07-31 MED ORDER — AMOXICILLIN 875 MG PO TABS: 875.0000 mg | ORAL_TABLET | Freq: Two times a day (BID) | ORAL | 0 refills | Status: AC

## 2021-07-31 NOTE — ED Provider Notes (Signed)
Roderic Palau    CSN: [id number] Arrival date & time: 07/31/21  1620      History   Chief Complaint Chief Complaint  Patient presents with   Abscess    Tooth infection - Entered by patient    HPI Cassandra Duarte is a 36 y.o. female.  Patient presents with dental pain today.  She states she was sitting on her sofa this morning and suddenly her right upper molar broke.  She states the pain has been so bad that she has been nauseated.  She called a dentist but they cannot see her until Tuesday.  She has taken Tylenol and ibuprofen.  She denies fever, chills, difficulty swallowing, or other symptoms.  Her medical history includes chronic back pain, chronic pain syndrome, kidney stones, liver, anxiety, GERD, ADHD.  The history is provided by the patient and medical records.    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/21/2020   Lumbosacral Grade 1 Anterolisthesis of L5/S1 04/21/2020   Osteoarthritis of spine with radiculopathy, lumbar region 01/28/2020   Postherpetic neuralgia 12/10/2019   Rash and nonspecific skin eruption 11/28/2019   Vitamin D deficiency 06/27/2019   History of bilateral tubal ligation 04/06/2019   Anxiety 01/15/2019   Cervical radiculopathy 05/17/2018   Lumbar radiculopathy 05/17/2018   Controlled substance agreement signed 11/29/2017   Status post laparoscopy with adhesio lysis 07/13/2017   Chronic pain syndrome 06/29/2017   Pars defect of lumbar spine  (bilateral L5 pars fractures) 06/29/2017   Myofascial pain 06/29/2017   ADD (attention deficit disorder) 05/26/2017   Menorrhagia with regular cycle 05/03/2017   Dysmenorrhea 05/03/2017   Family history of breast cancer 10/01/2015   Chronic low back pain with right-sided sciatica 10/09/2014    Past Surgical History:  Procedure Laterality Date   CHOLECYSTECTOMY     DILATION AND CURETTAGE OF UTERUS  2014   [facility]   LAPAROSCOPIC LYSIS OF ADHESIONS  06/20/2017   Procedure: LAPAROSCOPIC LYSIS OF ADHESIONS;  Surgeon: Brayton Mars, MD;  Location: [facility];  Service: Gynecology;;   LAPAROSCOPY N/A 06/20/2017   Procedure: OPERATIVE DIAGNOSTIC WITH BIOPSIES;  Surgeon: Brayton Mars, MD;  Location: [facility];  Service: Gynecology;  Laterality: N/A;   TUBAL LIGATION Bilateral 02/21/2015   Procedure: BILATERAL TUBAL LIGATION;  Surgeon: Brayton Mars, MD;  Location: [facility];  Service: Gynecology;  Laterality: Bilateral;    OB History     Gravida  8   Para  3   Term  3   Preterm      AB  5   Living  3      SAB  5   IAB      Ectopic      Multiple  0   Live Births  3        Obstetric Comments  H/O PTL both pregnanacies starting around 28 weeks requiring bedrest  and pelvic rest - did carry to 37 and 38 weeks respectfully          Home Medications    Prior to Admission medications   Medication Sig Start Date End Date Taking? Authorizing Provider  amoxicillin (AMOXIL) 875 MG tablet Take 1 tablet (875 mg total) by mouth 2 (two) times daily for 7 days. 07/31/21 08/07/21 Yes Sharion Balloon, NP  ondansetron (ZOFRAN-ODT) 4 MG disintegrating tablet Take 1 tablet (4 mg total) by mouth every 8 (eight) hours as needed for nausea or vomiting. 07/31/21  Yes Sharion Balloon, NP  amphetamine-dextroamphetamine (ADDERALL) 20 MG tablet Take 1 tablet (20 mg total) by mouth 2 (two) times daily. 07/18/20 08/17/20  Jon Billings, NP  amphetamine-dextroamphetamine (ADDERALL) 5 MG  tablet Take 1 tablet (5 mg total) by mouth daily. 08/06/20 09/05/20  Jon Billings, NP  baclofen (LIORESAL) 10 MG tablet TAKE 1 TABLET BY MOUTH THREE TIMES DAILY FOR 10 DAYS AS NEEDED    [provider]  cholecalciferol (VITAMIN D3) 25 MCG (1000 UNIT) tablet Take 1,000 Units by mouth daily.    [provider]  guaiFENesin (ROBITUSSIN) 100 MG/5ML SOLN Take 5 mLs (100 mg total) by mouth every 4 (four) hours as needed for cough or to loosen phlegm. Patient not taking: Reported on 02/05/2021 05/22/20   Jon Billings, NP  ibuprofen (ADVIL) 800 MG tablet Take 1 tablet (800 mg total) by mouth every 8 (eight) hours as needed for mild pain or moderate pain. 02/09/19   Lysle Pearl, Isami, DO  ibuprofen (ADVIL) 800 MG tablet Take 1 tablet by mouth 3 (three) times daily.    [provider]  meloxicam (MOBIC) 15 MG tablet meloxicam 15 mg tablet  Take 1 tablet every day by oral route.    [provider]  meloxicam (MOBIC) 15 MG tablet Take 1 tablet by mouth daily.    [provider]  methocarbamol (ROBAXIN) 500 MG tablet Take 1 tablet (500 mg total) by mouth 2 (two) times daily. Patient not taking: Reported on 02/05/2021 06/13/20   Jon Billings, NP  methocarbamol (ROBAXIN) 500 MG tablet Take 1 tablet by mouth 3 (three) times daily as needed.    [provider]  methylPREDNISolone (MEDROL DOSEPAK) 4 MG TBPK tablet Take as directed Patient not taking: Reported on 02/05/2021 05/22/20   Jon Billings, NP  Multiple Vitamin (MULTIVITAMIN PO) Take 1 tablet by mouth daily.  Patient not taking: Reported on 02/05/2021    [provider]  oxyCODONE (OXY IR/ROXICODONE) 5 MG immediate release tablet Take 1 tablet by mouth every 8 (eight) hours as needed.    [provider]  valACYclovir (VALTREX) 1000 MG tablet Take 1 tablet (1,000 mg total) by mouth 2 (two) times daily. Patient not taking: Reported on 02/05/2021 01/28/20   Park Liter P, DO  Vitamin  D, Ergocalciferol, (DRISDOL) 1.25 MG (50000 UNIT) CAPS capsule Take 1 capsule (50,000 Units total) by mouth every 7 (seven) days. Patient not taking: Reported on 11/27/2019 06/27/19   Volney American, PA-C  omeprazole (PRILOSEC) 40 MG capsule Take 1 capsule (40 mg total) by mouth daily. 06/27/19 10/23/19  Volney American, PA-C    Family History Family History  Problem Relation Age of Onset   Breast cancer Paternal Aunt    Diabetes Paternal Aunt    Diabetes Maternal Grandfather    COPD Maternal Grandmother    Cancer Maternal Grandmother        lung   Colon cancer Neg Hx  Ovarian cancer Neg Hx    Heart disease Neg Hx     Social History Social History   Tobacco Use   Smoking status: Former    Packs/day: 0.50    Years: 4.00    Total pack years: 2.00    Types: Cigarettes    Quit date: 04/02/2014    Years since quitting: 7.3   Smokeless tobacco: Never  Vaping Use   Vaping Use: Never used  Substance Use Topics   Alcohol use: No    Alcohol/week: 0.0 standard drinks of alcohol   Drug use: No     Allergies   Magnesium-containing compounds, Toradol [ketorolac tromethamine], Tramadol, Tamiflu [oseltamivir phosphate], Tylenol [acetaminophen], Gabapentin, and Vyvanse [lisdexamfetamine]   Review of Systems Review of Systems  Constitutional:  Negative for chills and fever.  HENT:  Positive for dental problem. Negative for sore throat, trouble swallowing and voice change.   Respiratory:  Negative for cough and shortness of breath.   Cardiovascular:  Negative for chest pain and palpitations.  Gastrointestinal:  Positive for nausea. Negative for abdominal pain and vomiting.  Skin:  Negative for color change and rash.  All other systems reviewed and are negative.    Physical Exam Triage Vital Signs ED Triage Vitals  Enc Vitals Group     BP      Pulse      Resp      Temp      Temp src      SpO2      Weight      Height      Head Circumference      Peak Flow       Pain Score      Pain Loc      Pain Edu?      Excl. in [place]?    No data found.  Updated Vital Signs BP 126/84   Pulse 84   Temp 97.8 F (36.6 C)   Resp 18   SpO2 98%   Visual Acuity Right Eye Distance:   Left Eye Distance:   Bilateral Distance:    Right Eye Near:   Left Eye Near:    Bilateral Near:     Physical Exam Vitals and nursing note reviewed.  Constitutional:      General: She is not in acute distress.    Appearance: She is well-developed. She is not ill-appearing.  HENT:     Mouth/Throat:     Mouth: Mucous membranes are moist.   Cardiovascular:     Rate and Rhythm: Normal rate and regular rhythm.     Heart sounds: Normal heart sounds.  Pulmonary:     Effort: Pulmonary effort is normal. No respiratory distress.     Breath sounds: Normal breath sounds.  Abdominal:     Palpations: Abdomen is soft.     Tenderness: There is no abdominal tenderness. There is no guarding or rebound.  Musculoskeletal:     Cervical back: Neck supple.  Skin:    General: Skin is warm and dry.  Neurological:     Mental Status: She is alert.  Psychiatric:        Mood and Affect: Mood normal.        Behavior: Behavior normal.      UC Treatments / Results  Labs (all labs ordered are listed, but only abnormal results are displayed) Labs Reviewed - No data to display  EKG   Radiology No results found.  Procedures Procedures (including critical care time)  Medications Ordered in UC Medications - No data to display  Initial Impression / Assessment and Plan / UC Course  I have reviewed the triage vital signs and the nursing notes.  Pertinent labs & imaging results that were available during my care of the patient were reviewed by me and considered in my medical decision making (see chart for details).  Dental pain, nausea without vomiting.  Patient has a broken tooth on her right upper molar.  Treating with amoxicillin.  Treating nausea with Zofran.  Education provided  on dental pain.  Dental resource guide provided.  Instructed patient to follow-up with a dentist as soon as possible.  ED precautions discussed.  She agrees to plan of care.   Final Clinical Impressions(s) / UC Diagnoses   Final diagnoses:  Pain, dental  Nausea without vomiting     Discharge Instructions      Take the antibiotic as prescribed.    A dental resource guide is attached.  Please call to make an appointment with a dentist as soon as possible.    Go to the emergency department if you have acute worsening symptoms.       ED Prescriptions     Medication Sig Dispense Auth. Provider   amoxicillin (AMOXIL) 875 MG tablet Take 1 tablet (875 mg total) by mouth 2 (two) times daily for 7 days. 14 tablet Barkley Boards H, NP   ondansetron (ZOFRAN-ODT) 4 MG disintegrating tablet Take 1 tablet (4 mg total) by mouth every 8 (eight) hours as needed for nausea or vomiting. 20 tablet Sharion Balloon, NP      I have reviewed the PDMP during this encounter.   Sharion Balloon, NP 07/31/21 1650

## 2021-07-31 NOTE — Discharge Instructions (Addendum)
Take the antibiotic as prescribed.    A dental resource guide is attached.  Please call to make an appointment with a dentist as soon as possible.    Go to the emergency department if you have acute worsening symptoms.     

## 2021-07-31 NOTE — ED Triage Notes (Signed)
Patient presents to Urgent Care with complaints of dental abscess. She states her tooth chipped a couple hrs ago. Dental clinic instructed her to come to UC for evaluation. Took tylenol and ibuprofen.

## 2021-08-05 DIAGNOSIS — F900 Attention-deficit hyperactivity disorder, predominantly inattentive type: Secondary | ICD-10-CM | POA: Diagnosis not present

## 2021-08-12 DIAGNOSIS — F900 Attention-deficit hyperactivity disorder, predominantly inattentive type: Secondary | ICD-10-CM | POA: Diagnosis not present

## 2021-08-14 DIAGNOSIS — F900 Attention-deficit hyperactivity disorder, predominantly inattentive type: Secondary | ICD-10-CM | POA: Diagnosis not present

## 2021-08-19 DIAGNOSIS — F900 Attention-deficit hyperactivity disorder, predominantly inattentive type: Secondary | ICD-10-CM | POA: Diagnosis not present

## 2021-08-21 DIAGNOSIS — F900 Attention-deficit hyperactivity disorder, predominantly inattentive type: Secondary | ICD-10-CM | POA: Diagnosis not present

## 2021-08-25 DIAGNOSIS — F900 Attention-deficit hyperactivity disorder, predominantly inattentive type: Secondary | ICD-10-CM | POA: Diagnosis not present

## 2021-09-03 DIAGNOSIS — F900 Attention-deficit hyperactivity disorder, predominantly inattentive type: Secondary | ICD-10-CM | POA: Diagnosis not present

## 2021-10-27 ENCOUNTER — Telehealth: Payer: Self-pay | Admitting: Nurse Practitioner

## 2021-10-27 NOTE — Telephone Encounter (Signed)
Copied from [place] [id number]. Topic: Referral - Request for Referral >> Oct 27, 2021  2:41 PM Sabas Sous wrote: Has patient seen PCP for this complaint? Yes.   *If NO, is insurance requiring patient see PCP for this issue before PCP can refer them? Referral for which specialty: Therapy/Psychiatry  Preferred provider/office: [facility]  Reason for referral: Headaches + Therapy

## 2021-10-28 ENCOUNTER — Ambulatory Visit: Payer: Medicaid Other | Admitting: Nurse Practitioner

## 2021-10-28 NOTE — Telephone Encounter (Signed)
Noted  

## 2021-11-23 DIAGNOSIS — F900 Attention-deficit hyperactivity disorder, predominantly inattentive type: Secondary | ICD-10-CM | POA: Diagnosis not present

## 2021-11-24 DIAGNOSIS — F900 Attention-deficit hyperactivity disorder, predominantly inattentive type: Secondary | ICD-10-CM | POA: Diagnosis not present

## 2021-12-01 DIAGNOSIS — F900 Attention-deficit hyperactivity disorder, predominantly inattentive type: Secondary | ICD-10-CM | POA: Diagnosis not present

## 2021-12-04 DIAGNOSIS — F900 Attention-deficit hyperactivity disorder, predominantly inattentive type: Secondary | ICD-10-CM | POA: Diagnosis not present

## 2021-12-15 DIAGNOSIS — F900 Attention-deficit hyperactivity disorder, predominantly inattentive type: Secondary | ICD-10-CM | POA: Diagnosis not present

## 2022-02-13 DIAGNOSIS — E7800 Pure hypercholesterolemia, unspecified: Secondary | ICD-10-CM | POA: Insufficient documentation

## 2022-02-13 NOTE — Patient Instructions (Incomplete)

## 2022-02-16 ENCOUNTER — Encounter: Payer: Medicaid Other | Admitting: Nurse Practitioner

## 2022-02-16 DIAGNOSIS — Z124 Encounter for screening for malignant neoplasm of cervix: Secondary | ICD-10-CM

## 2022-02-16 DIAGNOSIS — G894 Chronic pain syndrome: Secondary | ICD-10-CM

## 2022-02-16 DIAGNOSIS — E7800 Pure hypercholesterolemia, unspecified: Secondary | ICD-10-CM

## 2022-02-16 DIAGNOSIS — Z1159 Encounter for screening for other viral diseases: Secondary | ICD-10-CM

## 2022-02-16 DIAGNOSIS — Z0000 Encounter for general adult medical examination without abnormal findings: Secondary | ICD-10-CM

## 2022-02-16 DIAGNOSIS — F419 Anxiety disorder, unspecified: Secondary | ICD-10-CM

## 2022-02-16 DIAGNOSIS — E559 Vitamin D deficiency, unspecified: Secondary | ICD-10-CM

## 2022-02-23 DIAGNOSIS — F900 Attention-deficit hyperactivity disorder, predominantly inattentive type: Secondary | ICD-10-CM | POA: Diagnosis not present

## 2022-03-22 ENCOUNTER — Emergency Department
Admission: EM | Admit: 2022-03-22 | Discharge: 2022-03-22 | Discharge status: Against Medical Advice | Payer: Medicaid Other | Attending: Emergency Medicine | Admitting: Emergency Medicine

## 2022-03-22 ENCOUNTER — Other Ambulatory Visit: Payer: Self-pay

## 2022-03-22 ENCOUNTER — Encounter: Payer: Self-pay | Admitting: Emergency Medicine

## 2022-03-22 DIAGNOSIS — Z5321 Procedure and treatment not carried out due to patient leaving prior to being seen by health care provider: Secondary | ICD-10-CM | POA: Insufficient documentation

## 2022-03-22 DIAGNOSIS — M79601 Pain in right arm: Secondary | ICD-10-CM | POA: Insufficient documentation

## 2022-03-22 NOTE — ED Triage Notes (Signed)
Pt in via POV c/o right arm pain. Pt states that she was checking out yesterday in [place] when she had a shap pain in her right arm. Pt states that arm goes numb from shoulder to hand when going to lift objects. Pt states there is a new small red spot on upper arm. Denies being bitten. And spot on upper arm is tender to the touch.

## 2022-03-25 ENCOUNTER — Ambulatory Visit: Payer: Medicaid Other | Admitting: Family Medicine

## 2022-03-25 ENCOUNTER — Encounter: Payer: Self-pay | Admitting: Family Medicine

## 2022-03-25 VITALS — BP 122/79 | HR 98 | Temp 98.6°F | Wt 126.9 lb

## 2022-03-25 DIAGNOSIS — M79601 Pain in right arm: Secondary | ICD-10-CM

## 2022-03-25 DIAGNOSIS — R0602 Shortness of breath: Secondary | ICD-10-CM | POA: Diagnosis not present

## 2022-03-25 MED ORDER — METHOCARBAMOL 500 MG PO TABS: 500.0000 mg | ORAL_TABLET | Freq: Every evening | ORAL | 2 refills | Status: DC | PRN

## 2022-03-25 NOTE — Progress Notes (Signed)
BP 122/79   Pulse 98   Temp 98.6 F (37 C) (Oral)   Wt 126 lb 14.4 oz (57.6 kg)   SpO2 99%   BMI 23.21 kg/m    Subjective:    Patient ID: Cassandra Duarte, female    DOB: 10-25-1985, 37 y.o.   MRN: [id number]  HPI: Cassandra Duarte is a 37 y.o. female  Chief Complaint  Patient presents with   Arm Pain    Patient says she had a pain that started in her back that radiate around to the front declines having any arm pain. Patient says she started feeling bad and then got better. Patient says she has been having pain and sharp pain in her R shoulder and radiates down into her hand. Patient says she has been having this issues for the past three weeks. Patient says she has a family history of Blood Clot Disorder.   Had an episode of back pain that went into her chest 3 weeks ago that lasted about 45 minutes and SOB and nausea a couple of days after that. She is feeling better from now. She notes that when her heart starts racing, she will feel a little SOB.   SHOULDER PAIN Duration: about 10 days ago Involved shoulder: right Mechanism of injury: unknown Location: posterior Onset:sudden Severity: moderate  Quality:  throbbing Frequency: constant Radiation: no, but had pain that is moving around to different spots on her arm Aggravating factors: sleep  Alleviating factors: rest  Status: fluctuating Treatments attempted: rest, ice, heat, APAP, and ibuprofen  Relief with NSAIDs?:  mild Weakness: yes Numbness: yes Decreased grip strength: no Redness: no Swelling: no Bruising: no Fevers: no   Relevant past medical, surgical, family and social history reviewed and updated as indicated. Interim medical history since our last visit reviewed. Allergies and medications reviewed and updated.  Review of Systems  Constitutional: Negative.   Respiratory:  Positive for shortness of breath. Negative for apnea, cough, choking, chest tightness, wheezing and stridor.    Cardiovascular:  Positive for chest pain. Negative for palpitations and leg swelling.  Gastrointestinal: Negative.   Musculoskeletal:  Positive for arthralgias and myalgias. Negative for back pain, gait problem, joint swelling, neck pain and neck stiffness.  Neurological: Negative.   Psychiatric/Behavioral: Negative.      Per HPI unless specifically indicated above     Objective:    BP 122/79   Pulse 98   Temp 98.6 F (37 C) (Oral)   Wt 126 lb 14.4 oz (57.6 kg)   SpO2 99%   BMI 23.21 kg/m   Wt Readings from Last 3 Encounters:  03/25/22 126 lb 14.4 oz (57.6 kg)  03/22/22 123 lb (55.8 kg)  02/05/21 121 lb 9.6 oz (55.2 kg)    Physical Exam Vitals and nursing note reviewed.  Constitutional:      General: She is not in acute distress.    Appearance: Normal appearance. She is not ill-appearing, toxic-appearing or diaphoretic.  HENT:     Head: Normocephalic and atraumatic.     Right Ear: External ear normal.     Left Ear: External ear normal.     Nose: Nose normal.     Mouth/Throat:     Mouth: Mucous membranes are moist.     Pharynx: Oropharynx is clear.  Eyes:     General: No scleral icterus.       Right eye: No discharge.        Left eye: No discharge.  Extraocular Movements: Extraocular movements intact.     Conjunctiva/sclera: Conjunctivae normal.     Pupils: Pupils are equal, round, and reactive to light.  Cardiovascular:     Rate and Rhythm: Normal rate and regular rhythm.     Pulses: Normal pulses.     Heart sounds: Normal heart sounds. No murmur heard.    No friction rub. No gallop.  Pulmonary:     Effort: Pulmonary effort is normal. No respiratory distress.     Breath sounds: Normal breath sounds. No stridor. No wheezing, rhonchi or rales.  Chest:     Chest wall: No tenderness.  Musculoskeletal:        General: Tenderness (diffusely to R shoulder) present. Normal range of motion.     Cervical back: Normal range of motion and neck supple.  Skin:     General: Skin is warm and dry.     Capillary Refill: Capillary refill takes less than 2 seconds.     Coloration: Skin is not jaundiced or pale.     Findings: No bruising, erythema, lesion or rash.  Neurological:     General: No focal deficit present.     Mental Status: She is alert and oriented to person, place, and time. Mental status is at baseline.  Psychiatric:        Mood and Affect: Mood normal.        Behavior: Behavior normal.        Thought Content: Thought content normal.        Judgment: Judgment normal.     Results for orders placed or performed in visit on 03/11/20  [id number] 11+Oxyco+Alc+Crt-Bund  Result Value Ref Range   Ethanol Negative Cutoff=0.020 %   Amphetamines, Urine Negative Cutoff=1000 ng/mL   Barbiturate Negative Cutoff=200 ng/mL   BENZODIAZ UR QL Negative Cutoff=200 ng/mL   Cannabinoid Quant, Ur Negative Cutoff=50 ng/mL   Cocaine (Metabolite) Negative Cutoff=300 ng/mL   OPIATE SCREEN URINE See Final Results Cutoff=300 ng/mL   Oxycodone/Oxymorphone, Urine Negative Cutoff=300 ng/mL   Phencyclidine Negative Cutoff=25 ng/mL   Methadone Screen, Urine Negative Cutoff=300 ng/mL   Propoxyphene Negative Cutoff=300 ng/mL   Meperidine Negative Cutoff=200 ng/mL   Tramadol Negative Cutoff=200 ng/mL   Creatinine 82.4 20.0 - 300.0 mg/dL   pH, Urine 5.6 4.5 - 8.9  Comp Met (CMET)  Result Value Ref Range   Glucose 92 65 - 99 mg/dL   BUN 12 6 - 20 mg/dL   Creatinine, Ser 0.61 0.57 - 1.00 mg/dL   GFR calc non Af Amer 119 >59 mL/min/1.73   GFR calc Af Amer 137 >59 mL/min/1.73   BUN/Creatinine Ratio 20 9 - 23   Sodium 140 134 - 144 mmol/L   Potassium 4.4 3.5 - 5.2 mmol/L   Chloride 106 96 - 106 mmol/L   CO2 19 (L) 20 - 29 mmol/L   Calcium 8.6 (L) 8.7 - 10.2 mg/dL   Total Protein 6.1 6.0 - 8.5 g/dL   Albumin 4.2 3.8 - 4.8 g/dL   Globulin, Total 1.9 1.5 - 4.5 g/dL   Albumin/Globulin Ratio 2.2 1.2 - 2.2   Bilirubin Total <0.2 0.0 - 1.2 mg/dL   Alkaline Phosphatase 69  44 - 121 IU/L   AST 16 0 - 40 IU/L   ALT 13 0 - 32 IU/L  Vitamin D (25 hydroxy)  Result Value Ref Range   Vit D, 25-Hydroxy 31.0 30.0 - 100.0 ng/mL  Opiates Confirmation, Urine  Result Value Ref Range   Opiates Positive (A) Cutoff=300  ng/mL     Codeine Negative Cutoff=300     Morphine Negative Cutoff=300     Hydromorphone Negative Cutoff=300     Hydrocodone Positive (A)       Hydrocodone Confirm 727 Cutoff=300 ng/mL      Assessment & Plan:   Problem List Items Addressed This Visit   None Visit Diagnoses     Right arm pain    -  Primary   Concern for radiculopathy. Will check x-rays and treat with methocarbamol and stretches. Follow up with PCP in a couple of weeks.   Relevant Orders   DG Cervical Spine Complete   DG Shoulder Right   DG Thoracic Spine W/Swimmers   SOB (shortness of breath)       Given family history of blood clots will check d-dimer. Await results.   Relevant Orders   D-Dimer, Quantitative        Follow up plan: Return 2-4 weeks with Cassandra Duarte, follow up shoulder pain.

## 2022-03-26 LAB — D-DIMER, QUANTITATIVE: D-DIMER: <0.2 mg/L FEU (ref 0.00–0.49)

## 2022-04-08 ENCOUNTER — Ambulatory Visit: Payer: Medicaid Other | Admitting: Nurse Practitioner

## 2022-05-20 DIAGNOSIS — F4323 Adjustment disorder with mixed anxiety and depressed mood: Secondary | ICD-10-CM | POA: Diagnosis not present

## 2022-05-20 DIAGNOSIS — F901 Attention-deficit hyperactivity disorder, predominantly hyperactive type: Secondary | ICD-10-CM | POA: Diagnosis not present

## 2022-06-17 DIAGNOSIS — F901 Attention-deficit hyperactivity disorder, predominantly hyperactive type: Secondary | ICD-10-CM | POA: Diagnosis not present

## 2022-06-17 DIAGNOSIS — F4323 Adjustment disorder with mixed anxiety and depressed mood: Secondary | ICD-10-CM | POA: Diagnosis not present

## 2022-07-28 ENCOUNTER — Ambulatory Visit: Admit: 2022-07-28 | Payer: Medicaid Other

## 2022-07-28 DIAGNOSIS — S161XXA Strain of muscle, fascia and tendon at neck level, initial encounter: Secondary | ICD-10-CM | POA: Diagnosis not present

## 2022-07-28 DIAGNOSIS — S29012A Strain of muscle and tendon of back wall of thorax, initial encounter: Secondary | ICD-10-CM | POA: Diagnosis not present

## 2022-07-28 DIAGNOSIS — M47812 Spondylosis without myelopathy or radiculopathy, cervical region: Secondary | ICD-10-CM | POA: Diagnosis not present

## 2022-07-28 DIAGNOSIS — M542 Cervicalgia: Secondary | ICD-10-CM | POA: Diagnosis not present

## 2022-08-05 ENCOUNTER — Ambulatory Visit: Payer: Self-pay | Admitting: *Deleted

## 2022-08-05 ENCOUNTER — Ambulatory Visit: Payer: Medicaid Other | Admitting: Physician Assistant
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
## 2022-08-06 ENCOUNTER — Ambulatory Visit: Payer: Medicaid Other | Admitting: Physician Assistant

## 2022-08-06 ENCOUNTER — Encounter: Payer: Self-pay | Admitting: Physician Assistant

## 2022-08-06 VITALS — BP 103/65 | HR 90 | Ht 62.0 in | Wt 131.6 lb

## 2022-08-06 DIAGNOSIS — S060X0A Concussion without loss of consciousness, initial encounter: Secondary | ICD-10-CM | POA: Diagnosis not present

## 2022-08-06 DIAGNOSIS — R110 Nausea: Secondary | ICD-10-CM

## 2022-08-06 MED ORDER — ONDANSETRON HCL 4 MG PO TABS: 4.0000 mg | ORAL_TABLET | Freq: Three times a day (TID) | ORAL | 0 refills | Status: DC | PRN
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
## 2022-08-10 DIAGNOSIS — S060X0A Concussion without loss of consciousness, initial encounter: Secondary | ICD-10-CM | POA: Insufficient documentation
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
## 2022-09-09 DIAGNOSIS — F901 Attention-deficit hyperactivity disorder, predominantly hyperactive type: Secondary | ICD-10-CM | POA: Diagnosis not present

## 2022-09-09 DIAGNOSIS — F4323 Adjustment disorder with mixed anxiety and depressed mood: Secondary | ICD-10-CM | POA: Diagnosis not present

## 2022-09-17 ENCOUNTER — Ambulatory Visit: Payer: Medicaid Other | Admitting: Physician Assistant

## 2022-09-17 NOTE — Progress Notes (Deleted)
Acute Office Visit   Patient: Cassandra Duarte   DOB: March 01, 1985   37 y.o. Female  MRN: [id number] Visit Date: 09/17/2022  Today's healthcare provider: Oswaldo Conroy Aletheia Tangredi, PA-C  Introduced myself to the patient as a Secondary school teacher and provided education on APPs in clinical practice.    No chief complaint on file.  Subjective    HPI    Medications: Outpatient Medications Prior to Visit  Medication Sig   amphetamine-dextroamphetamine (ADDERALL) 20 MG tablet Take 1 tablet (20 mg total) by mouth 2 (two) times daily.   amphetamine-dextroamphetamine (ADDERALL) 5 MG tablet Take 1 tablet (5 mg total) by mouth daily.   baclofen (LIORESAL) 10 MG tablet TAKE 1 TABLET BY MOUTH THREE TIMES DAILY FOR 10 DAYS AS NEEDED (Patient not taking: Reported on 03/25/2022)   cholecalciferol (VITAMIN D3) 25 MCG (1000 UNIT) tablet Take 1,000 Units by mouth daily. (Patient not taking: Reported on 08/06/2022)   ibuprofen (ADVIL) 800 MG tablet Take 1 tablet (800 mg total) by mouth every 8 (eight) hours as needed for mild pain or moderate pain. (Patient not taking: Reported on 08/06/2022)   meloxicam (MOBIC) 15 MG tablet Take 1 tablet by mouth daily. (Patient not taking: Reported on 03/25/2022)   methocarbamol (ROBAXIN) 500 MG tablet Take 1 tablet (500 mg total) by mouth at bedtime as needed. (Patient not taking: Reported on 08/06/2022)   ondansetron (ZOFRAN) 4 MG tablet Take 1 tablet (4 mg total) by mouth every 8 (eight) hours as needed for nausea or vomiting.   No facility-administered medications prior to visit.    Review of Systems  {Insert previous labs (optional):23779} {See past labs  Heme  Chem  Endocrine  Serology  Results Review (optional):1}   Objective    There were no vitals taken for this visit. {Insert last BP/Wt (optional):23777}{See vitals history (optional):1}   Physical Exam    No results found for any visits on 09/17/22.  Assessment & Plan      No follow-ups on file.

## 2022-09-23 ENCOUNTER — Ambulatory Visit: Payer: Medicaid Other | Admitting: Pediatrics

## 2022-10-01 DIAGNOSIS — F901 Attention-deficit hyperactivity disorder, predominantly hyperactive type: Secondary | ICD-10-CM | POA: Diagnosis not present

## 2022-10-01 DIAGNOSIS — F4323 Adjustment disorder with mixed anxiety and depressed mood: Secondary | ICD-10-CM | POA: Diagnosis not present

## 2022-10-27 ENCOUNTER — Ambulatory Visit: Payer: Medicaid Other | Admitting: Nurse Practitioner
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
## 2022-11-12 ENCOUNTER — Telehealth: Payer: Medicaid Other | Admitting: Family Medicine

## 2022-12-01 DIAGNOSIS — F4323 Adjustment disorder with mixed anxiety and depressed mood: Secondary | ICD-10-CM | POA: Diagnosis not present

## 2022-12-01 DIAGNOSIS — F901 Attention-deficit hyperactivity disorder, predominantly hyperactive type: Secondary | ICD-10-CM | POA: Diagnosis not present

## 2022-12-09 ENCOUNTER — Ambulatory Visit: Payer: Medicaid Other | Admitting: Family Medicine

## 2022-12-09 ENCOUNTER — Encounter: Payer: Self-pay | Admitting: Family Medicine

## 2022-12-09 ENCOUNTER — Encounter: Payer: Self-pay | Admitting: Nurse Practitioner

## 2022-12-09 VITALS — BP 117/83 | HR 87 | Temp 98.9°F | Resp 16 | Wt 129.4 lb

## 2022-12-09 DIAGNOSIS — R110 Nausea: Secondary | ICD-10-CM | POA: Diagnosis not present

## 2022-12-09 DIAGNOSIS — S060X0A Concussion without loss of consciousness, initial encounter: Secondary | ICD-10-CM

## 2022-12-09 DIAGNOSIS — R519 Headache, unspecified: Secondary | ICD-10-CM

## 2022-12-09 DIAGNOSIS — M5412 Radiculopathy, cervical region: Secondary | ICD-10-CM | POA: Diagnosis not present

## 2022-12-09 MED ORDER — NORTRIPTYLINE HCL 10 MG PO CAPS: 10.0000 mg | ORAL_CAPSULE | Freq: Every day | ORAL | 2 refills | Status: DC

## 2022-12-09 MED ORDER — ONDANSETRON HCL 4 MG PO TABS: 4.0000 mg | ORAL_TABLET | Freq: Three times a day (TID) | ORAL | 0 refills | Status: AC | PRN
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
## 2022-12-13 NOTE — Assessment & Plan Note (Signed)
Known. Will get her into PT. Recheck in about a month.

## 2022-12-13 NOTE — Assessment & Plan Note (Signed)
Will obtain MRI of the brain and get her into PT for ?cervicogenic headaches. Start nortriptyline. Recheck 1 month. Call wiht any concerns.

## 2022-12-21 ENCOUNTER — Telehealth: Payer: Self-pay | Admitting: Family Medicine

## 2022-12-21 NOTE — Telephone Encounter (Signed)
Case number: [id number]  Called for Peer to Peer and was approved  Auth# [id number] Valid 12/21/22-02/18/23

## 2022-12-23 ENCOUNTER — Ambulatory Visit: Admission: RE | Admit: 2022-12-23 | Payer: Medicaid Other | Source: Ambulatory Visit

## 2022-12-26 ENCOUNTER — Ambulatory Visit: Admission: RE | Admit: 2022-12-26 | Payer: Medicaid Other | Source: Ambulatory Visit

## 2023-01-06 ENCOUNTER — Ambulatory Visit: Payer: Medicaid Other | Admitting: Family Medicine

## 2023-01-07 ENCOUNTER — Ambulatory Visit
Admission: RE | Admit: 2023-01-07 | Discharge: 2023-01-07 | Disposition: A | Discharge status: Home / Self Care | Payer: Medicaid Other | Source: Ambulatory Visit | Attending: Family Medicine | Admitting: Family Medicine

## 2023-01-07 DIAGNOSIS — S060X0A Concussion without loss of consciousness, initial encounter: Secondary | ICD-10-CM | POA: Insufficient documentation

## 2023-01-07 DIAGNOSIS — R519 Headache, unspecified: Secondary | ICD-10-CM | POA: Insufficient documentation

## 2023-01-07 DIAGNOSIS — J320 Chronic maxillary sinusitis: Secondary | ICD-10-CM | POA: Diagnosis not present

## 2023-01-10 ENCOUNTER — Other Ambulatory Visit: Payer: Self-pay | Admitting: Family Medicine

## 2023-01-10 MED ORDER — AMOXICILLIN-POT CLAVULANATE 875-125 MG PO TABS: 1.0000 | ORAL_TABLET | Freq: Two times a day (BID) | ORAL | 0 refills | Status: DC

## 2023-01-27 ENCOUNTER — Ambulatory Visit: Payer: Medicaid Other | Admitting: Family Medicine

## 2023-02-07 ENCOUNTER — Ambulatory Visit: Payer: Medicaid Other | Admitting: Family Medicine

## 2023-02-22 ENCOUNTER — Encounter: Payer: Self-pay | Admitting: Family Medicine

## 2023-02-22 ENCOUNTER — Ambulatory Visit: Payer: Medicaid Other | Admitting: Family Medicine

## 2023-02-22 VITALS — BP 114/77 | Ht 62.0 in | Wt 133.0 lb

## 2023-02-22 DIAGNOSIS — I951 Orthostatic hypotension: Secondary | ICD-10-CM

## 2023-02-22 DIAGNOSIS — Z136 Encounter for screening for cardiovascular disorders: Secondary | ICD-10-CM | POA: Diagnosis not present

## 2023-02-22 DIAGNOSIS — R519 Headache, unspecified: Secondary | ICD-10-CM

## 2023-02-22 DIAGNOSIS — R5383 Other fatigue: Secondary | ICD-10-CM

## 2023-02-22 DIAGNOSIS — Z124 Encounter for screening for malignant neoplasm of cervix: Secondary | ICD-10-CM

## 2023-02-22 DIAGNOSIS — Z8379 Family history of other diseases of the digestive system: Secondary | ICD-10-CM | POA: Diagnosis not present

## 2023-02-22 DIAGNOSIS — Z1159 Encounter for screening for other viral diseases: Secondary | ICD-10-CM | POA: Diagnosis not present

## 2023-02-22 DIAGNOSIS — R42 Dizziness and giddiness: Secondary | ICD-10-CM | POA: Diagnosis not present

## 2023-02-22 MED ORDER — NORTRIPTYLINE HCL 10 MG PO CAPS: 10.0000 mg | ORAL_CAPSULE | Freq: Every day | ORAL | 2 refills | Status: AC

## 2023-02-22 NOTE — Progress Notes (Signed)
 Ht 5' 2 (1.575 m)   Wt 133 lb (60.3 kg)   SpO2 97%   BMI 24.33 kg/m    Subjective:    Patient ID: Cassandra Duarte, female    DOB: 05-15-1985, 38 y.o.   MRN: [id number]  HPI: Cassandra Duarte is a 38 y.o. female  Chief Complaint  Patient presents with   Headache    Patient says her headaches are still every other day. Patient feel the stressors from her day to day from taking care of her son with Autism.    POTS    Patient says she would like to discuss with the provider at today's visit. Patient says she has a family member who is diagnosed with POTS and says she would like to discuss similar symptoms. Patient says when she takes showers after she has to lay down as she feels tired. Patient says she noticed white spots on her legs from her knee down to her ankle and says it has been going on for years.    Took the nortriptyline  for about 3 weeks and then stopped it as she didn't feel like it was helping. Her headaches have not gotten any better. She has not been able to get into PT to work on her neck.   DIZZINESS- she is concerned about POTS, her cousin has it and would like to be tested Duration: chronic Description of symptoms: dizziness when in the shower Duration of episode: minutes Dizziness frequency: recurrent Provoking factors: showering Triggered by rolling over in bed: no Triggered by bending over: no Aggravated by head movement: no Aggravated by exertion, coughing, loud noises: no Recent head injury: no Recent or current viral symptoms: no History of vasovagal episodes: no Nausea: no Vomiting: no Tinnitus: no Hearing loss: no Aural fullness: yes Headache: yes Photophobia/phonophobia: no Unsteady gait: no Postural instability: yes Diplopia, dysarthria, dysphagia or weakness: no Related to exertion: no Pallor: no Diaphoresis: no Dyspnea: no Chest pain: no   Relevant past medical, surgical, family and social history reviewed and updated as  indicated. Interim medical history since our last visit reviewed. Allergies and medications reviewed and updated.  Review of Systems  Constitutional:  Positive for fatigue. Negative for activity change, appetite change, chills, diaphoresis, fever and unexpected weight change.  Respiratory: Negative.    Cardiovascular:  Positive for palpitations and leg swelling. Negative for chest pain.  Gastrointestinal: Negative.   Musculoskeletal: Negative.   Neurological:  Positive for dizziness, syncope, weakness, light-headedness and headaches. Negative for tremors, seizures, facial asymmetry, speech difficulty and numbness.  Psychiatric/Behavioral: Negative.      Per HPI unless specifically indicated above     Objective:    Ht 5' 2 (1.575 m)   Wt 133 lb (60.3 kg)   SpO2 97%   BMI 24.33 kg/m   Wt Readings from Last 3 Encounters:  02/22/23 133 lb (60.3 kg)  12/09/22 129 lb 6.4 oz (58.7 kg)  08/06/22 131 lb 9.6 oz (59.7 kg)    Vitals:    Physical Exam Vitals and nursing note reviewed.  Constitutional:      General: She is not in acute distress.    Appearance: Normal appearance. She is well-developed and normal weight. She is not ill-appearing, toxic-appearing or diaphoretic.  HENT:     Head: Normocephalic and atraumatic.     Right Ear: External ear normal.     Left Ear: External ear normal.     Nose: Nose normal.     Mouth/Throat:  Mouth: Mucous membranes are moist.     Pharynx: Oropharynx is clear.  Eyes:     General: No scleral icterus.       Right eye: No discharge.        Left eye: No discharge.     Extraocular Movements: Extraocular movements intact.     Conjunctiva/sclera: Conjunctivae normal.     Pupils: Pupils are equal, round, and reactive to light.  Cardiovascular:     Rate and Rhythm: Normal rate and regular rhythm.     Pulses: Normal pulses.     Heart sounds: Normal heart sounds. No murmur heard.    No friction rub. No gallop.  Pulmonary:     Effort:  Pulmonary effort is normal. No respiratory distress.     Breath sounds: Normal breath sounds. No stridor. No wheezing, rhonchi or rales.  Chest:     Chest wall: No tenderness.  Musculoskeletal:        General: Normal range of motion.     Cervical back: Normal range of motion and neck supple.  Skin:    General: Skin is warm and dry.     Capillary Refill: Capillary refill takes less than 2 seconds.     Coloration: Skin is not jaundiced or pale.     Findings: No bruising, erythema, lesion or rash.  Neurological:     General: No focal deficit present.     Mental Status: She is alert and oriented to person, place, and time. Mental status is at baseline.  Psychiatric:        Mood and Affect: Mood normal.        Behavior: Behavior normal.        Thought Content: Thought content normal.        Judgment: Judgment normal.     Results for orders placed or performed in visit on 02/22/23  Bayer DCA Hb A1c Waived   Collection Time: 02/22/23  4:30 PM  Result Value Ref Range   HB A1C (BAYER DCA - WAIVED) 5.3 4.8 - 5.6 %  CBC with Differential/Platelet   Collection Time: 02/22/23  4:33 PM  Result Value Ref Range   WBC 8.9 3.4 - 10.8 x10E3/uL   RBC 4.10 3.77 - 5.28 x10E6/uL   Hemoglobin 12.7 11.1 - 15.9 g/dL   Hematocrit 62.7 65.9 - 46.6 %   MCV 91 79 - 97 fL   MCH 31.0 26.6 - 33.0 pg   MCHC 34.1 31.5 - 35.7 g/dL   RDW 86.7 88.2 - 84.5 %   Platelets 235 150 - 450 x10E3/uL   Neutrophils 67 Not Estab. %   Lymphs 22 Not Estab. %   Monocytes 5 Not Estab. %   Eos 5 Not Estab. %   Basos 1 Not Estab. %   Neutrophils Absolute 6.0 1.4 - 7.0 x10E3/uL   Lymphocytes Absolute 1.9 0.7 - 3.1 x10E3/uL   Monocytes Absolute 0.5 0.1 - 0.9 x10E3/uL   EOS (ABSOLUTE) 0.5 (H) 0.0 - 0.4 x10E3/uL   Basophils Absolute 0.1 0.0 - 0.2 x10E3/uL   Immature Granulocytes 0 Not Estab. %   Immature Grans (Abs) 0.0 0.0 - 0.1 x10E3/uL  Lipid Panel w/o Chol/HDL Ratio   Collection Time: 02/22/23  4:33 PM  Result Value  Ref Range   Cholesterol, Total 172 100 - 199 mg/dL   Triglycerides 784 (H) 0 - 149 mg/dL   HDL 41 >60 mg/dL   VLDL Cholesterol Cal 37 5 - 40 mg/dL   LDL Chol Calc (NIH)  94 0 - 99 mg/dL  TSH   Collection Time: 02/22/23  4:33 PM  Result Value Ref Range   TSH 1.540 0.450 - 4.500 uIU/mL  VITAMIN D  25 Hydroxy (Vit-D Deficiency, Fractures)   Collection Time: 02/22/23  4:33 PM  Result Value Ref Range   Vit D, 25-Hydroxy 35.6 30.0 - 100.0 ng/mL  Comprehensive metabolic panel   Collection Time: 02/22/23  4:33 PM  Result Value Ref Range   Glucose 112 (H) 70 - 99 mg/dL   BUN 14 6 - 20 mg/dL   Creatinine, Ser 9.40 0.57 - 1.00 mg/dL   eGFR 880 >40 fO/fpw/8.26   BUN/Creatinine Ratio 24 (H) 9 - 23   Sodium 138 134 - 144 mmol/L   Potassium 4.4 3.5 - 5.2 mmol/L   Chloride 105 96 - 106 mmol/L   CO2 21 20 - 29 mmol/L   Calcium  9.3 8.7 - 10.2 mg/dL   Total Protein 6.4 6.0 - 8.5 g/dL   Albumin 4.2 3.9 - 4.9 g/dL   Globulin, Total 2.2 1.5 - 4.5 g/dL   Bilirubin Total <9.7 0.0 - 1.2 mg/dL   Alkaline Phosphatase 65 44 - 121 IU/L   AST 20 0 - 40 IU/L   ALT 19 0 - 32 IU/L  Hepatitis C Antibody   Collection Time: 02/22/23  4:33 PM  Result Value Ref Range   Hep C Virus Ab Non Reactive Non Reactive  B12   Collection Time: 02/22/23  4:33 PM  Result Value Ref Range   Vitamin B-12 1,680 (H) 232 - 1,245 pg/mL  Gliadin antibodies, serum   Collection Time: 02/22/23  4:33 PM  Result Value Ref Range   Antigliadin Abs, IgA 4 0 - 19 units   Gliadin IgG 2 0 - 19 units  Tissue transglutaminase, IgA   Collection Time: 02/22/23  4:33 PM  Result Value Ref Range   Transglutaminase IgA <2 0 - 3 U/mL  Reticulin Antibody, IgA w reflex titer   Collection Time: 02/22/23  4:33 PM  Result Value Ref Range   Reticulin Ab, IgA Negative Neg:<1:2.5 titer      Assessment & Plan:   Problem List Items Addressed This Visit       Other   Dizziness - Primary   No orthostasis on exam today. Likely multi-factorial.  Will check labs. Patient requests referral to POTS evaluation- referral to cardiology placed today.      Relevant Orders   Ambulatory referral to Cardiology   Other Visit Diagnoses       Acute nonintractable headache, unspecified headache type       Enouraged patient to restart nortriptyline  and call for PT. Continue to monitor. Recheck in about a month.   Relevant Medications   nortriptyline  (PAMELOR ) 10 MG capsule     Family history of celiac disease       Labs drawn today. Await results.   Relevant Orders   Gliadin antibodies, serum (Completed)   Tissue transglutaminase, IgA (Completed)   Reticulin Antibody, IgA w reflex titer (Completed)     Other fatigue       Labs drawn today. Await results.   Relevant Orders   CBC with Differential/Platelet (Completed)   Bayer DCA Hb A1c Waived (Completed)   TSH (Completed)   VITAMIN D  25 Hydroxy (Vit-D Deficiency, Fractures) (Completed)   Comprehensive metabolic panel (Completed)   B12 (Completed)     Screening for cardiovascular condition       Labs drawn today. Await results.  Relevant Orders   Lipid Panel w/o Chol/HDL Ratio (Completed)     Need for hepatitis C screening test       Labs drawn today. Await results.   Relevant Orders   Hepatitis C Antibody (Completed)        Follow up plan: Return in about 3 weeks (around 03/15/2023) for physical.

## 2023-02-23 ENCOUNTER — Encounter: Payer: Self-pay | Admitting: Family Medicine

## 2023-02-23 DIAGNOSIS — F901 Attention-deficit hyperactivity disorder, predominantly hyperactive type: Secondary | ICD-10-CM | POA: Diagnosis not present

## 2023-02-23 DIAGNOSIS — F4323 Adjustment disorder with mixed anxiety and depressed mood: Secondary | ICD-10-CM | POA: Diagnosis not present

## 2023-02-23 LAB — BAYER DCA HB A1C WAIVED: HB A1C (BAYER DCA - WAIVED): 5.3 % (ref 4.8–5.6)

## 2023-02-26 DIAGNOSIS — R42 Dizziness and giddiness: Secondary | ICD-10-CM | POA: Insufficient documentation

## 2023-02-26 LAB — CBC WITH DIFFERENTIAL/PLATELET
Basophils Absolute: 0.1 10*3/uL (ref 0.0–0.2)
Basos: 1 %
EOS (ABSOLUTE): 0.5 10*3/uL — ABNORMAL HIGH (ref 0.0–0.4)
Eos: 5 %
Hematocrit: 37.2 % (ref 34.0–46.6)
Hemoglobin: 12.7 g/dL (ref 11.1–15.9)
Immature Grans (Abs): 0 10*3/uL (ref 0.0–0.1)
Immature Granulocytes: 0 %
Lymphocytes Absolute: 1.9 10*3/uL (ref 0.7–3.1)
Lymphs: 22 %
MCH: 31 pg (ref 26.6–33.0)
MCHC: 34.1 g/dL (ref 31.5–35.7)
MCV: 91 fL (ref 79–97)
Monocytes Absolute: 0.5 10*3/uL (ref 0.1–0.9)
Monocytes: 5 %
Neutrophils Absolute: 6 10*3/uL (ref 1.4–7.0)
Neutrophils: 67 %
Platelets: 235 10*3/uL (ref 150–450)
RBC: 4.1 x10E6/uL (ref 3.77–5.28)
RDW: 13.2 % (ref 11.7–15.4)
WBC: 8.9 10*3/uL (ref 3.4–10.8)

## 2023-02-26 LAB — LIPID PANEL W/O CHOL/HDL RATIO
Cholesterol, Total: 172 mg/dL (ref 100–199)
HDL: 41 mg/dL (ref >39)
LDL Chol Calc (NIH): 94 mg/dL (ref 0–99)
Triglycerides: 215 mg/dL — ABNORMAL HIGH (ref 0–149)
VLDL Cholesterol Cal: 37 mg/dL (ref 5–40)

## 2023-02-26 LAB — COMPREHENSIVE METABOLIC PANEL
ALT: 19 [IU]/L (ref 0–32)
AST: 20 [IU]/L (ref 0–40)
Albumin: 4.2 g/dL (ref 3.9–4.9)
Alkaline Phosphatase: 65 [IU]/L (ref 44–121)
BUN/Creatinine Ratio: 24 — ABNORMAL HIGH (ref 9–23)
BUN: 14 mg/dL (ref 6–20)
Bilirubin Total: <0.2 mg/dL (ref 0.0–1.2)
CO2: 21 mmol/L (ref 20–29)
Calcium: 9.3 mg/dL (ref 8.7–10.2)
Chloride: 105 mmol/L (ref 96–106)
Creatinine, Ser: 0.59 mg/dL (ref 0.57–1.00)
Globulin, Total: 2.2 g/dL (ref 1.5–4.5)
Glucose: 112 mg/dL — ABNORMAL HIGH (ref 70–99)
Potassium: 4.4 mmol/L (ref 3.5–5.2)
Sodium: 138 mmol/L (ref 134–144)
Total Protein: 6.4 g/dL (ref 6.0–8.5)
eGFR: 119 mL/min/{1.73_m2} (ref >59)

## 2023-02-26 LAB — GLIADIN ANTIBODIES, SERUM
Antigliadin Abs, IgA: 4 U (ref 0–19)
Gliadin IgG: 2 U (ref 0–19)

## 2023-02-26 LAB — TISSUE TRANSGLUTAMINASE, IGA: Transglutaminase IgA: <2 U/mL (ref 0–3)

## 2023-02-26 LAB — HEPATITIS C ANTIBODY: Hep C Virus Ab: NONREACTIVE

## 2023-02-26 LAB — VITAMIN B12: Vitamin B-12: 1680 pg/mL — ABNORMAL HIGH (ref 232–1245)

## 2023-02-26 LAB — TSH: TSH: 1.54 u[IU]/mL (ref 0.450–4.500)

## 2023-02-26 LAB — RETICULIN ANTIBODIES, IGA W TITER: Reticulin Ab, IgA: NEGATIVE {titer} (ref <2.5)

## 2023-02-26 LAB — VITAMIN D 25 HYDROXY (VIT D DEFICIENCY, FRACTURES): Vit D, 25-Hydroxy: 35.6 ng/mL (ref 30.0–100.0)

## 2023-02-26 NOTE — Assessment & Plan Note (Signed)
 No orthostasis on exam today. Likely multi-factorial. Will check labs. Patient requests referral to POTS evaluation- referral to cardiology placed today.

## 2023-03-17 ENCOUNTER — Encounter: Payer: Medicaid Other | Admitting: Family Medicine

## 2023-03-29 ENCOUNTER — Encounter: Payer: Medicaid Other | Admitting: Family Medicine

## 2023-04-07 DIAGNOSIS — F901 Attention-deficit hyperactivity disorder, predominantly hyperactive type: Secondary | ICD-10-CM | POA: Diagnosis not present

## 2023-04-07 DIAGNOSIS — F4323 Adjustment disorder with mixed anxiety and depressed mood: Secondary | ICD-10-CM | POA: Diagnosis not present

## 2023-04-27 ENCOUNTER — Encounter: Payer: Self-pay | Admitting: Family Medicine

## 2023-04-27 ENCOUNTER — Ambulatory Visit: Payer: Self-pay

## 2023-04-27 ENCOUNTER — Emergency Department: Admission: EM | Admit: 2023-04-27 | Discharge: 2023-04-27 | Discharge status: Against Medical Advice

## 2023-04-27 NOTE — ED Notes (Signed)
 No answer when called several times from lobby

## 2023-04-27 NOTE — ED Notes (Signed)
 Pt re-enters lobby from outside; informed that she has been called twice for triage; st that she is leaving now and will f/u with PCP in morning; encouraged pt to stay and be eval but declines stating she is leaving

## 2023-04-27 NOTE — Telephone Encounter (Signed)
 Chief Complaint: shingles Symptoms: rash to L eyelid that feels like shingles Frequency: 3 days Pertinent Negatives: Patient denies fever, CP, SOB, flu-like symptoms Disposition: [] ED /[x] Urgent Care (no appt availability in office) / [] Appointment(In office/virtual)/ []  [facility]/ [] Home Care/ [] Refused Recommended Disposition /[] [facility]/ []  Follow-up with PCP Additional Notes: Pt reports a rash to her L upper eyelid for 3 days that has spread. Pt states she believes she has shingles as she has had this before. Pt endorses 8/10 pain. Denies fever or flu-like symptoms but endorses headaches and sensitivity to light. RN advised pt she should be seen within 4 hours, advised that the pt go to UC in [place]. RN made pt a 1730 reservation in line. Pt agreeable to that plan. RN advised pt she should call 911 for CP, SOB, N/V, sudden severe worsening of headache. Pt verbalized understanding.    Copied from CRM [id number]. Topic: Clinical - Red Word Triage >> Apr 27, 2023  4:52 PM [place] J wrote: [facility] that prompted transfer to Nurse Triage: Patient has very painful shingles on her eyes. Reason for Disposition  [1] Shingles rash (matches SYMPTOMS) AND [2] weak immune system (e.g., HIV positive, cancer chemotherapy, chronic steroid treatment, splenectomy) AND [3] NOT taking antiviral medication  Answer Assessment - Initial Assessment Questions 1. APPEARANCE of RASH: "Describe the rash."      3 days ago - came up as one little blister in the crease of her eye. Today her eyelid is swollen, "sores" still there. Hx of HSV 1&2 2. LOCATION: "Where is the rash located?"      L eyelid 3. ONSET: "When did the rash start?"      3 days 4. ITCHING: "Does the rash itch?" If Yes, ask: "How bad is the itch?"  (Scale 1-10; or mild, moderate, severe)     No 5. PAIN: "Does the rash hurt?" If Yes, ask: "How bad is the pain?"  (Scale 0-10; or none, mild, moderate, severe)    - NONE (0): no  pain    - MILD (1-3): doesn't interfere with normal activities     - MODERATE (4-7): interferes with normal activities or awakens from sleep     - SEVERE (8-10): excruciating pain, unable to do any normal activities     8/10 - stabbing, "shocking pain" 6. OTHER SYMPTOMS: "Do you have any other symptoms?" (e.g., fever)     "I just don't feel good", "my head it hurting from the shocking pain", photosensitivity "I guess because my eyelid is so swollen". Tried ibuprofen and tylenol with no relief  Protocols used: Shingles (Zoster)-A-AH

## 2023-04-27 NOTE — Progress Notes (Signed)
 Received call from nurse triage regarding patient calling about shingles and describing ocular involvement. She went to an urgent care (unspecified) and was directed to [facility] ER where she left due to long wait despite triage nurse at ER advising her to stay.   I informed on call nurse triage to contact patient, advise on the potential risk of significant adverse outcome and to direct her to ER.

## 2023-04-28 ENCOUNTER — Encounter: Admitting: Family Medicine

## 2023-04-29 NOTE — Telephone Encounter (Signed)
 Tried to reach patient to schedule office visit. Unable to reach patient to get her an acute appointment. If she returns call please attempt to schedule before sending to clinic for assistance.

## 2023-05-18 DIAGNOSIS — F4323 Adjustment disorder with mixed anxiety and depressed mood: Secondary | ICD-10-CM | POA: Diagnosis not present

## 2023-05-18 DIAGNOSIS — F901 Attention-deficit hyperactivity disorder, predominantly hyperactive type: Secondary | ICD-10-CM | POA: Diagnosis not present

## 2023-06-21 ENCOUNTER — Ambulatory Visit: Admitting: Cardiology

## 2023-07-27 ENCOUNTER — Telehealth: Payer: Self-pay

## 2023-07-27 NOTE — Telephone Encounter (Signed)
 Please contact patient and advise that we may not be able to assist with completing a note as we generally have to see patient for a visit in order to complete any type of request such as what she is requesting. Please assist in scheduling needed visit.

## 2023-07-27 NOTE — Telephone Encounter (Signed)
 She wanted to be seen by anyone with openings next week, she wants a note for insurance I explained that it may not be possible to get this at this appointment. She wanted to schedule anyway.
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
## 2023-07-31 NOTE — Patient Instructions (Incomplete)
Post-Concussion Syndrome  A concussion is a brain injury from a direct hit to your head or body. This hit makes your brain shake fast in your skull. Normally, brain injuries are not life-threatening. Post-concussion syndrome is when symptoms last longer than normal after a head injury. What are the causes? The cause of this condition is not known. It can happen if your head injury was mild or very bad. What increases the risk? Being female. Being young. Having had a head injury before. Having had headaches a lot before. Being sad (depressed) or feeling worried or nervous (anxious). Having more than one symptom or very bad symptoms when you got injured. Fainting when you got your concussion or not being able to remember it. What are the signs or symptoms? After a head injury, you may have physical symptoms, such as: Headaches. Feeling tired, dizzy, or weak. Trouble seeing. Having eye trouble in bright lights. Trouble hearing. Problems with balance. You may also have symptoms that affect your thinking or your feelings, such as: Not being able to remember things. Not being able to focus. Trouble falling asleep or staying asleep (insomnia). Feeling grouchy (irritable). Feeling worried or sad. Trouble learning new things. Symptoms can last weeks or months. Sometimes, symptoms are serious. How is this treated? Treatment may depend on your symptoms. These normally go away on their own with time. If you need treatment, it may include: Medicines. Resting your brain and body for a few days. Doing therapy to help you heal (rehab therapy). This may include: Physical or occupational therapy. This may include exercises. Talking to a counselor. Speech therapy. Therapy to help your eyes. Follow these instructions at home: Medicines Take over-the-counter and prescription medicines only as told by your doctor. Avoid pain medicines that have opioids in them. Activity Limit activities as  told by your doctor. This may include not doing these things: Homework. Work for your job. Hard thinking. Watching TV. Using a computer or phone. Puzzles and games for your brain. Exercise and sports. Slowly return to your normal activities as told by your doctor. Slow down or stop an activity if you get symptoms. Rest. Try to sleep 7-9 hours each night. Also, try taking naps or breaks when you feel tired during the day. Do not do anything that could cause you to get hurt again right away. Getting hurt again can harm your brain. General instructions  Do not drink alcohol until your doctor says that you can. Keep track of your symptoms, and tell your doctor about them. Keep all follow-up visits. Your doctors may need to check you for new or serious symptoms. Where to find more information [employer]: concussionfoundation.org Contact a doctor if: You do not improve. You get injured again. You have changes in how you act. Get help right away if: You have a very bad headache or a headache that gets worse. You can't stop vomiting. Any part of your body feels weak or loses feeling. You feel mixed up (confused). You have trouble talking. You feel very sleepy. You faint or you have a seizure. These symptoms may be an emergency. Get help right away. Call 911. Do not wait to see if the symptoms will go away. Do not drive yourself to the hospital. Also, get help right away if: You think about hurting yourself or others. Take one of these steps if you feel like you may hurt yourself or others, or have thoughts about taking your own life: Go to your nearest emergency room.  Call 911. Call the National Suicide Prevention Lifeline at 604-286-5422 or 988. This is open 24 hours a day. Text the Crisis Text Line at 650-656-5841. This information is not intended to replace advice given to you by your health care provider. Make sure you discuss any questions you have with your health  care provider. Document Revised: 05/29/2021 Document Reviewed: 05/29/2021 Elsevier Patient Education  2024 [employer].

## 2023-08-02 ENCOUNTER — Ambulatory Visit: Admitting: Nurse Practitioner

## 2023-08-09 ENCOUNTER — Ambulatory Visit: Admitting: Family Medicine

## 2023-08-10 DIAGNOSIS — F4323 Adjustment disorder with mixed anxiety and depressed mood: Secondary | ICD-10-CM | POA: Diagnosis not present

## 2023-08-10 DIAGNOSIS — F901 Attention-deficit hyperactivity disorder, predominantly hyperactive type: Secondary | ICD-10-CM | POA: Diagnosis not present

## 2023-11-16 DIAGNOSIS — F901 Attention-deficit hyperactivity disorder, predominantly hyperactive type: Secondary | ICD-10-CM | POA: Diagnosis not present

## 2023-11-16 DIAGNOSIS — F4323 Adjustment disorder with mixed anxiety and depressed mood: Secondary | ICD-10-CM | POA: Diagnosis not present
